# Patient Record
Sex: Female | Born: 1955
Health system: Southern US, Community
[De-identification: ages and names within clinical notes are randomized; demographics above are authoritative.]

## PROBLEM LIST (undated history)

## (undated) DIAGNOSIS — R1310 Dysphagia, unspecified: Secondary | ICD-10-CM

## (undated) DIAGNOSIS — M1990 Unspecified osteoarthritis, unspecified site: Secondary | ICD-10-CM

## (undated) DIAGNOSIS — Z8659 Personal history of other mental and behavioral disorders: Secondary | ICD-10-CM

## (undated) DIAGNOSIS — F419 Anxiety disorder, unspecified: Secondary | ICD-10-CM

## (undated) DIAGNOSIS — E039 Hypothyroidism, unspecified: Secondary | ICD-10-CM

## (undated) DIAGNOSIS — G43909 Migraine, unspecified, not intractable, without status migrainosus: Secondary | ICD-10-CM

## (undated) DIAGNOSIS — K219 Gastro-esophageal reflux disease without esophagitis: Secondary | ICD-10-CM

## (undated) DIAGNOSIS — D509 Iron deficiency anemia, unspecified: Secondary | ICD-10-CM

## (undated) DIAGNOSIS — D72821 Monocytosis (symptomatic): Secondary | ICD-10-CM

## (undated) DIAGNOSIS — K900 Celiac disease: Secondary | ICD-10-CM

## (undated) DIAGNOSIS — K9089 Other intestinal malabsorption: Secondary | ICD-10-CM

## (undated) HISTORY — DX: Personal history of other mental and behavioral disorders: Z86.59

## (undated) HISTORY — PX: TMJ ARTHROPLASTY: SHX1066

## (undated) HISTORY — DX: Migraine, unspecified, not intractable, without status migrainosus: G43.909

## (undated) HISTORY — PX: SINUS IRRIGATION: SHX2411

## (undated) HISTORY — DX: Anxiety disorder, unspecified: F41.9

## (undated) HISTORY — DX: Monocytosis (symptomatic): D72.821

## (undated) HISTORY — DX: Gastro-esophageal reflux disease without esophagitis: K21.9

## (undated) HISTORY — DX: Dysphagia, unspecified: R13.10

## (undated) HISTORY — PX: ABDOMINAL HYSTERECTOMY: SHX81

## (undated) HISTORY — DX: Celiac disease: K90.0

## (undated) HISTORY — DX: Iron deficiency anemia, unspecified: D50.9

## (undated) HISTORY — PX: DIAGNOSTIC LAPAROSCOPY: SUR761

## (undated) HISTORY — DX: Hypothyroidism, unspecified: E03.9

## (undated) HISTORY — DX: Other intestinal malabsorption: K90.89

---

## 1998-07-15 ENCOUNTER — Encounter: Payer: Self-pay | Admitting: Gynecology

## 1998-07-15 ENCOUNTER — Ambulatory Visit (HOSPITAL_COMMUNITY): Admission: RE | Admit: 1998-07-15 | Discharge: 1998-07-15 | Payer: Self-pay | Admitting: Gynecology

## 2000-08-03 ENCOUNTER — Other Ambulatory Visit: Admission: RE | Admit: 2000-08-03 | Discharge: 2000-08-03 | Payer: Self-pay | Admitting: Gynecology

## 2001-05-06 ENCOUNTER — Encounter: Admission: RE | Admit: 2001-05-06 | Discharge: 2001-05-06 | Payer: Self-pay | Admitting: Family Medicine

## 2001-05-06 ENCOUNTER — Encounter: Payer: Self-pay | Admitting: Family Medicine

## 2001-08-08 ENCOUNTER — Other Ambulatory Visit: Admission: RE | Admit: 2001-08-08 | Discharge: 2001-08-08 | Payer: Self-pay | Admitting: Gynecology

## 2002-08-14 ENCOUNTER — Other Ambulatory Visit: Admission: RE | Admit: 2002-08-14 | Discharge: 2002-08-14 | Payer: Self-pay | Admitting: Gynecology

## 2002-08-16 ENCOUNTER — Encounter: Payer: Self-pay | Admitting: Gynecology

## 2002-08-16 ENCOUNTER — Ambulatory Visit (HOSPITAL_COMMUNITY): Admission: RE | Admit: 2002-08-16 | Discharge: 2002-08-16 | Payer: Self-pay | Admitting: Gynecology

## 2003-02-03 HISTORY — PX: FLEXIBLE SIGMOIDOSCOPY: SHX1649

## 2003-05-10 ENCOUNTER — Ambulatory Visit (HOSPITAL_COMMUNITY): Admission: RE | Admit: 2003-05-10 | Discharge: 2003-05-10 | Payer: Self-pay | Admitting: Chiropractic Medicine

## 2003-06-03 HISTORY — PX: COLONOSCOPY: SHX174

## 2003-08-23 ENCOUNTER — Other Ambulatory Visit: Admission: RE | Admit: 2003-08-23 | Discharge: 2003-08-23 | Payer: Self-pay | Admitting: Gynecology

## 2003-11-05 ENCOUNTER — Ambulatory Visit (HOSPITAL_COMMUNITY): Admission: RE | Admit: 2003-11-05 | Discharge: 2003-11-05 | Payer: Self-pay | Admitting: Gastroenterology

## 2003-11-05 ENCOUNTER — Encounter (INDEPENDENT_AMBULATORY_CARE_PROVIDER_SITE_OTHER): Payer: Self-pay | Admitting: *Deleted

## 2004-01-05 ENCOUNTER — Ambulatory Visit (HOSPITAL_COMMUNITY): Admission: RE | Admit: 2004-01-05 | Discharge: 2004-01-05 | Payer: Self-pay | Admitting: Oncology

## 2004-03-06 ENCOUNTER — Ambulatory Visit (HOSPITAL_COMMUNITY): Admission: RE | Admit: 2004-03-06 | Discharge: 2004-03-06 | Payer: Self-pay | Admitting: Orthopedic Surgery

## 2004-03-11 ENCOUNTER — Ambulatory Visit: Payer: Self-pay | Admitting: Oncology

## 2004-05-06 ENCOUNTER — Ambulatory Visit: Payer: Self-pay | Admitting: Oncology

## 2004-07-14 ENCOUNTER — Ambulatory Visit (HOSPITAL_COMMUNITY): Admission: RE | Admit: 2004-07-14 | Discharge: 2004-07-14 | Payer: Self-pay | Admitting: Gynecology

## 2004-08-21 ENCOUNTER — Other Ambulatory Visit: Admission: RE | Admit: 2004-08-21 | Discharge: 2004-08-21 | Payer: Self-pay | Admitting: Gynecology

## 2004-09-05 ENCOUNTER — Ambulatory Visit: Payer: Self-pay | Admitting: Oncology

## 2005-04-28 ENCOUNTER — Ambulatory Visit: Payer: Self-pay | Admitting: Oncology

## 2005-05-04 ENCOUNTER — Ambulatory Visit (HOSPITAL_COMMUNITY): Admission: RE | Admit: 2005-05-04 | Discharge: 2005-05-04 | Payer: Self-pay | Admitting: Obstetrics and Gynecology

## 2005-06-15 ENCOUNTER — Ambulatory Visit: Payer: Self-pay | Admitting: Oncology

## 2005-08-26 ENCOUNTER — Encounter: Admission: RE | Admit: 2005-08-26 | Discharge: 2005-08-26 | Payer: Self-pay | Admitting: Orthopedic Surgery

## 2005-08-27 ENCOUNTER — Other Ambulatory Visit: Admission: RE | Admit: 2005-08-27 | Discharge: 2005-08-27 | Payer: Self-pay | Admitting: Gynecology

## 2005-09-16 ENCOUNTER — Encounter: Admission: RE | Admit: 2005-09-16 | Discharge: 2005-09-16 | Payer: Self-pay | Admitting: Orthopedic Surgery

## 2006-03-22 ENCOUNTER — Ambulatory Visit: Payer: Self-pay | Admitting: Oncology

## 2006-06-10 ENCOUNTER — Ambulatory Visit: Payer: Self-pay | Admitting: Oncology

## 2006-06-14 LAB — CBC & DIFF AND RETIC
BASO%: 1.3 % (ref 0.0–2.0)
Basophils Absolute: 0.1 10*3/uL (ref 0.0–0.1)
EOS%: 6.1 % (ref 0.0–7.0)
Eosinophils Absolute: 0.5 10*3/uL (ref 0.0–0.5)
HCT: 39.5 % (ref 34.8–46.6)
HGB: 13.5 g/dL (ref 11.6–15.9)
IRF: 0.29 (ref 0.130–0.330)
LYMPH%: 25.7 % (ref 14.0–48.0)
MCH: 31.2 pg (ref 26.0–34.0)
MCHC: 34.2 g/dL (ref 32.0–36.0)
MCV: 91.2 fL (ref 81.0–101.0)
MONO#: 0.6 10*3/uL (ref 0.1–0.9)
MONO%: 8.6 % (ref 0.0–13.0)
NEUT#: 4.3 10*3/uL (ref 1.5–6.5)
NEUT%: 58.3 % (ref 39.6–76.8)
Platelets: 287 10*3/uL (ref 145–400)
RBC: 4.33 10*6/uL (ref 3.70–5.32)
RDW: 16.5 % — ABNORMAL HIGH (ref 11.3–14.5)
RETIC #: 45.5 10*3/uL (ref 19.7–115.1)
Retic %: 1.1 % (ref 0.4–2.3)
WBC: 7.4 10*3/uL (ref 3.9–10.0)
lymph#: 1.9 10*3/uL (ref 0.9–3.3)

## 2006-06-14 LAB — MORPHOLOGY: PLT EST: ADEQUATE

## 2006-06-14 LAB — IRON AND TIBC
%SAT: 14 % — ABNORMAL LOW (ref 20–55)
Iron: 37 ug/dL — ABNORMAL LOW (ref 42–145)
TIBC: 264 ug/dL (ref 250–470)
UIBC: 227 ug/dL

## 2006-06-14 LAB — FERRITIN: Ferritin: 34 ng/mL (ref 10–291)

## 2006-06-14 LAB — CHCC SMEAR

## 2006-07-28 ENCOUNTER — Ambulatory Visit (HOSPITAL_COMMUNITY): Admission: RE | Admit: 2006-07-28 | Discharge: 2006-07-28 | Payer: Self-pay | Admitting: Gynecology

## 2006-08-31 ENCOUNTER — Other Ambulatory Visit: Admission: RE | Admit: 2006-08-31 | Discharge: 2006-08-31 | Payer: Self-pay | Admitting: Gynecology

## 2007-06-10 ENCOUNTER — Ambulatory Visit: Payer: Self-pay | Admitting: Oncology

## 2007-11-08 ENCOUNTER — Ambulatory Visit: Payer: Self-pay | Admitting: Nurse Practitioner

## 2007-12-22 ENCOUNTER — Ambulatory Visit (HOSPITAL_COMMUNITY): Admission: RE | Admit: 2007-12-22 | Discharge: 2007-12-22 | Payer: Self-pay | Admitting: Gynecology

## 2008-01-06 ENCOUNTER — Encounter: Admission: RE | Admit: 2008-01-06 | Discharge: 2008-01-06 | Payer: Self-pay | Admitting: Gynecology

## 2008-06-11 ENCOUNTER — Ambulatory Visit: Payer: Self-pay | Admitting: Oncology

## 2008-12-04 ENCOUNTER — Ambulatory Visit: Payer: Self-pay | Admitting: Nurse Practitioner

## 2009-06-10 ENCOUNTER — Ambulatory Visit: Payer: Self-pay | Admitting: Oncology

## 2009-08-09 ENCOUNTER — Ambulatory Visit: Payer: Self-pay | Admitting: Oncology

## 2009-08-13 LAB — CBC WITH DIFFERENTIAL/PLATELET
BASO%: 1.4 % (ref 0.0–2.0)
Basophils Absolute: 0.1 10*3/uL (ref 0.0–0.1)
EOS%: 8.3 % — ABNORMAL HIGH (ref 0.0–7.0)
Eosinophils Absolute: 0.5 10*3/uL (ref 0.0–0.5)
HCT: 36.7 % (ref 34.8–46.6)
HGB: 12.2 g/dL (ref 11.6–15.9)
LYMPH%: 39.1 % (ref 14.0–49.7)
MCH: 31.4 pg (ref 25.1–34.0)
MCHC: 33.2 g/dL (ref 31.5–36.0)
MCV: 94.6 fL (ref 79.5–101.0)
MONO#: 0.6 10*3/uL (ref 0.1–0.9)
MONO%: 10.7 % (ref 0.0–14.0)
NEUT#: 2.3 10*3/uL (ref 1.5–6.5)
NEUT%: 40.5 % (ref 38.4–76.8)
Platelets: 274 10*3/uL (ref 145–400)
RBC: 3.88 10*6/uL (ref 3.70–5.45)
RDW: 14.3 % (ref 11.2–14.5)
WBC: 5.8 10*3/uL (ref 3.9–10.3)
lymph#: 2.3 10*3/uL (ref 0.9–3.3)

## 2009-12-31 ENCOUNTER — Ambulatory Visit: Payer: Self-pay | Admitting: Nurse Practitioner

## 2010-01-01 ENCOUNTER — Encounter (INDEPENDENT_AMBULATORY_CARE_PROVIDER_SITE_OTHER): Payer: Self-pay | Admitting: *Deleted

## 2010-01-01 LAB — CONVERTED CEMR LAB
Bilirubin Urine: NEGATIVE
Hemoglobin, Urine: NEGATIVE
Ketones, ur: NEGATIVE mg/dL
Leukocytes, UA: NEGATIVE
Nitrite: NEGATIVE
Protein, ur: NEGATIVE mg/dL
Specific Gravity, Urine: <1.005 — ABNORMAL LOW (ref 1.005–1.030)
Urine Glucose: NEGATIVE mg/dL
Urobilinogen, UA: 0.2 (ref 0.0–1.0)
pH: 7 (ref 5.0–8.0)

## 2010-02-23 ENCOUNTER — Encounter: Payer: Self-pay | Admitting: Orthopedic Surgery

## 2010-06-17 ENCOUNTER — Other Ambulatory Visit: Payer: Self-pay | Admitting: Oncology

## 2010-06-17 ENCOUNTER — Encounter (HOSPITAL_BASED_OUTPATIENT_CLINIC_OR_DEPARTMENT_OTHER): Payer: 59 | Admitting: Oncology

## 2010-06-17 DIAGNOSIS — D473 Essential (hemorrhagic) thrombocythemia: Secondary | ICD-10-CM

## 2010-06-17 DIAGNOSIS — D508 Other iron deficiency anemias: Secondary | ICD-10-CM

## 2010-06-17 DIAGNOSIS — K900 Celiac disease: Secondary | ICD-10-CM

## 2010-06-17 LAB — CBC WITH DIFFERENTIAL/PLATELET
BASO%: 0.5 % (ref 0.0–2.0)
Basophils Absolute: 0.1 10*3/uL (ref 0.0–0.1)
EOS%: 1.7 % (ref 0.0–7.0)
Eosinophils Absolute: 0.2 10*3/uL (ref 0.0–0.5)
HCT: 39.4 % (ref 34.8–46.6)
HGB: 13.1 g/dL (ref 11.6–15.9)
LYMPH%: 26 % (ref 14.0–49.7)
MCH: 31.3 pg (ref 25.1–34.0)
MCHC: 33.2 g/dL (ref 31.5–36.0)
MCV: 94 fL (ref 79.5–101.0)
MONO#: 1.1 10*3/uL — ABNORMAL HIGH (ref 0.1–0.9)
MONO%: 10.3 % (ref 0.0–14.0)
NEUT#: 6.5 10*3/uL (ref 1.5–6.5)
NEUT%: 61.5 % (ref 38.4–76.8)
Platelets: 258 10*3/uL (ref 145–400)
RBC: 4.19 10*6/uL (ref 3.70–5.45)
RDW: 14 % (ref 11.2–14.5)
WBC: 10.6 10*3/uL — ABNORMAL HIGH (ref 3.9–10.3)
lymph#: 2.8 10*3/uL (ref 0.9–3.3)
nRBC: 0 % (ref 0–0)

## 2010-06-17 LAB — VITAMIN B12: Vitamin B-12: 732 pg/mL (ref 211–911)

## 2010-06-17 LAB — FERRITIN: Ferritin: 14 ng/mL (ref 10–291)

## 2010-06-17 NOTE — Assessment & Plan Note (Signed)
NAME:  Marilyn, Collins NO.:  [id number]   MEDICAL RECORD NO.:  [id number]          PATIENT TYPE:  POB   LOCATION:  [place] at [place]:  Emeterio Reeve, MD       DATE OF BIRTH:  Jul 18, 1955   DATE OF SERVICE:  12/04/2008                                  CLINIC NOTE   The patient comes to office today for followup on her migraine  headaches.  The patient was last seen 1 year ago.  Since that time she  has been doing very well.  She has the exact same headache pattern.  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  She works weekend option.  She  notices that she gets a headache at approximately midnight on Fridays  and Saturdays if she has the headaches at all.  She has not had any  auras with her headaches this year.  She does take anti-inflammatories  from time to time for her arthritis.  She has used Reglan with some  help.  She continues to not want any headache prevention.  She has had  no health issues other than in July she has an episode of racing heart.  She felt that that was due to her TSH level being too low and her  medication being too high.  She was given Inderal to take on an as-  needed basis.  She has actually not had taken other than the original  time.  Her TSH will be redrawn this week.   PHYSICAL EXAM:  Well-developed, well-nourished, 55 year old Caucasian  female, in no acute distress.  Blood pressure is 110/42, pulse is 77.   ASSESSMENT:  Migraine with aura.   PLAN:  The patient is asking for a refill on her Imitrex 100 mg 1 p.o.  p.r.n. migraine, #9 with p.r.n. refills and Reglan 10 mg 1 p.o. q.8 h.  p.r.n. nausea #30 with 2 refills.  The patient will return to clinic in  1 year or sooner as needed.      Marilyn Azure, NP    ______________________________  Emeterio Reeve, MD    LR/MEDQ  D:  12/04/2008  T:  12/05/2008  Job:  [id number]

## 2010-06-17 NOTE — Assessment & Plan Note (Signed)
NAME:  Marilyn Collins, Marilyn Collins NO.:  [id number]   MEDICAL RECORD NO.:  [id number]          PATIENT TYPE:  POB   LOCATION:  [place] at [place]:  [place]:  Darron Doom, MD        DATE OF BIRTH:  [birth year]   DATE OF SERVICE:  11/08/2007                                  CLINIC NOTE   The patient comes to the office today for headache management.  She is a  well-known patient to me, testing her for many years at the [facility].  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  The  patient is currently not on any headache prevention.  She feels that her  headaches have been significantly improved with a gluten-free diet.  She  is using a gluten-free diet as she has a celiac disease.  She currently  uses Imitrex 50 mg and that works very well for her.  She does feel that  it gives her a little bit of nausea, but she is not interested in  switching at this time.  She works weekend option and mostly sees that  she has a headache on Friday and Saturday night when she works her  weekend option.  She does have occasional aura that includes a flashing  light that last several minutes.  Her headaches can be either temporal  or frontal.  She does not have any neck involvement.   ALLERGIES:  MORPHINE causes severe nausea and vomiting.   CURRENT MEDICATIONS:  1. Synthroid 0.150 mcg.  2. B complex.  3. Magnesium.  4. Vitamin D.  5. Vivelle patch 0.50.   MENSTRUAL HISTORY:  The patient's menstrual cycle began at the age of  10.   OBSTETRICAL:  She is G1, P2.  She has 1 adopted child.   GYNECOLOGIC HISTORY:  Her last Pap smear was in July 2009, which was  normal.  She did have a colonoscopy in 2005.   SURGICAL HISTORY:  She had a laparoscopy surgeries in 1985 and 1987.  She had a bilateral salpingo-oophorectomy and total abdominal  hysterectomy in 1995.  She had a TMJ surgery in 1991 and a sinus surgery  in 1993.   FAMILY HISTORY:  She  has a father with heart disease and heart attack,  and a mother with cancer.  Her mother had lymphoma.  The patient has  celiac disease and thyroid problems, as well as migraines.   SOCIAL HISTORY:  The patient is married and has 2 children, one 15-year-  old at home.  She again works as an Therapist, sports at [employer].  She does not  smoke.  She does not drink alcohol.  She drinks approximately 2  caffeinated beverages per day.   REVIEW OF SYSTEMS:  The patient denies bruising, numbness, swelling,  muscle aches, fatigue, weight gain, weight loss, dizziness, nausea,  vomiting, chest pain, and vaginal problems.   PHYSICAL EXAMINATION:  VITAL SIGNS:  Blood pressure is 100/54, pulse is  69, weight is 105, and height is 5 feet 7 inches.  GENERAL:  A well-developed, well-nourished 55 year old Caucasian female  in no acute  distress.  HEENT:  The patient's head is normocephalic and atraumatic.  CARDIAC:  Regular rate and rhythm.  LUNGS:  Clear bilaterally.  NEUROLOGIC:  The patient is alert, oriented, and intact.  She has good  muscle coordination and good sensation.  She is fluid in her thoughts  and ideas and images.  She is appropriate in her emotional status.   ASSESSMENT AND PLAN:  Migraine headache with aura.   PLAN:  The patient prefers not to have any prevention at this time.  She  will continue to take Imitrex 100 mg one-half tablet p.r.n. migraine.  She was given a prescription for #9 with p.r.n. refills.  We have also  asked her to add Reglan to that 10 mg 1 p.o. p.r.n. nausea.  She can use  this when she has an aura as well.  She will return to the office on a  p.r.n. basis.      Hubert Azure, NP    ______________________________  Darron Doom, MD    LR/MEDQ  D:  11/08/2007  T:  11/09/2007  Job:  [id number]
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
## 2010-06-20 NOTE — Op Note (Signed)
NAME:  Marilyn Collins, Marilyn Collins NO.:  [id number]   MEDICAL RECORD NO.:  [id number]          PATIENT TYPE:  AMB   LOCATION:  ENDO                         FACILITY:  [facility]   PHYSICIAN:  Ronald Lobo, M.D.   DATE OF BIRTH:  [birth year]   DATE OF PROCEDURE:  11/05/2003  DATE OF DISCHARGE:                                 OPERATIVE REPORT   PROCEDURE PERFORMED:  Upper endoscopy with biopsies.   ENDOSCOPIST:  Cleotis Nipper, M.D.   INDICATIONS FOR PROCEDURE:  The patient is a 55 year old female with  asymptomatic celiac disease, presenting primarily as iron deficiency anemia.  Duodenal biopsies from four months ago showed persistent villous atrophy but  since then, she has really tried to be more rigorous with her gluten  restriction and her anemia has improved.  This is being done to assess the  status of her small bowel mucosa.   FINDINGS:  Endoscopic findings suggestive of active celiac disease.   DESCRIPTION OF PROCEDURE:  The nature, purpose and risks of the procedure  were familiar to the patient, who provided written consent.  Sedation was  fentanyl  50 mcg and Versed 7 mg IV without arrhythmias or desaturation.  The Olympus small caliber video endoscope was passed under direct vision  entering the esophagus quite easily.  The vocal cords were not well seen.   The esophageal mucosa was normal.  There was no evidence of reflux  esophagitis, Barrett's esophagus, varices, infection, dysplasia or  stricture, significant ring or hiatal hernia.  There was a somewhat  prominent squamocolumnar junction and minimal intermittently present hiatal  hernia.  The stomach contained a small clear residual with a little bit of  bile staining.   No gastritis, erosions, ulcers, polyps or masses were seen in the stomach  and a retroflex view was unremarkable again showing just a very small hiatal  hernia intermittently from a _____  perspective.   The pylorus and duodenal bulb  looked normal.  The second duodenum had some  scalloping of the mucosal folds consistent with celiac disease.  Several  biopsies were obtained prior to removal of the scope.   The patient tolerated the procedure well and there were no apparent  complications.   IMPRESSION:  Celiac disease, with subtle endoscopic findings on current  examination.   PLAN:  Await pathology results.       RB/MEDQ  D:  11/05/2003  T:  11/05/2003  Job:  [id number]   cc:   Herbie Baltimore L. Maceo Pro, M.D.  [address] [place]  [place] [address]  Fax: [phone]   Alyson Locket. Beryle Beams, M.D.  [place]. [place] [address]  Fax: [phone]   Selinda Orion, M.D.  [address]  [place] [address]  Fax: [phone]

## 2010-10-14 ENCOUNTER — Other Ambulatory Visit: Payer: Self-pay | Admitting: Gynecology

## 2010-12-04 ENCOUNTER — Encounter (HOSPITAL_BASED_OUTPATIENT_CLINIC_OR_DEPARTMENT_OTHER): Payer: 59 | Admitting: Oncology

## 2010-12-04 DIAGNOSIS — K900 Celiac disease: Secondary | ICD-10-CM

## 2010-12-04 DIAGNOSIS — D508 Other iron deficiency anemias: Secondary | ICD-10-CM

## 2011-02-12 ENCOUNTER — Other Ambulatory Visit (HOSPITAL_COMMUNITY): Payer: Self-pay | Admitting: Orthopedic Surgery

## 2011-02-12 DIAGNOSIS — M545 Low back pain, unspecified: Secondary | ICD-10-CM

## 2011-02-17 ENCOUNTER — Ambulatory Visit (HOSPITAL_COMMUNITY)
Admission: RE | Admit: 2011-02-17 | Discharge: 2011-02-17 | Disposition: A | Discharge status: Home / Self Care | Payer: 59 | Source: Ambulatory Visit | Attending: Orthopedic Surgery | Admitting: Orthopedic Surgery

## 2011-02-17 DIAGNOSIS — G8929 Other chronic pain: Secondary | ICD-10-CM | POA: Insufficient documentation

## 2011-02-17 DIAGNOSIS — M545 Low back pain, unspecified: Secondary | ICD-10-CM | POA: Insufficient documentation

## 2011-02-17 DIAGNOSIS — M5126 Other intervertebral disc displacement, lumbar region: Secondary | ICD-10-CM | POA: Insufficient documentation

## 2011-05-01 ENCOUNTER — Telehealth: Payer: Self-pay | Admitting: Oncology

## 2011-05-01 ENCOUNTER — Telehealth: Payer: Self-pay | Admitting: *Deleted

## 2011-05-01 NOTE — Telephone Encounter (Signed)
Pt called reporting that she has had systemic fungal infections off & on, obvious nail fungus & has been taking antifungals but keeps coming back.  She wants to know if Dr. Beryle Beams has any suggestions for her.  She reports that she has an appt in June with Dr. Darnell Level & wonders if he  would see her sooner to discuss.  She can be reached at home @ [phone] or cell # [phone].

## 2011-05-01 NOTE — Telephone Encounter (Signed)
Pt called for f/u appt and was given appt for 6/17 [facility]).

## 2011-05-26 ENCOUNTER — Telehealth: Payer: Self-pay

## 2011-05-26 NOTE — Telephone Encounter (Signed)
Pt notified per Dr Beryle Beams "lab ok."  Pt given Ferritin level - 78.  Copy mailed to pt's home per her request. dph

## 2011-06-05 ENCOUNTER — Encounter: Payer: Self-pay | Admitting: Oncology

## 2011-07-20 ENCOUNTER — Encounter: Payer: Self-pay | Admitting: Oncology

## 2011-07-20 ENCOUNTER — Ambulatory Visit: Payer: 59 | Admitting: Oncology

## 2011-07-20 ENCOUNTER — Ambulatory Visit (HOSPITAL_BASED_OUTPATIENT_CLINIC_OR_DEPARTMENT_OTHER): Payer: 59 | Admitting: Oncology

## 2011-07-20 ENCOUNTER — Telehealth: Payer: Self-pay | Admitting: Oncology

## 2011-07-20 VITALS — BP 117/59 | HR 62 | Temp 97.1°F | Ht 67.0 in | Wt 100.9 lb

## 2011-07-20 DIAGNOSIS — D509 Iron deficiency anemia, unspecified: Secondary | ICD-10-CM

## 2011-07-20 DIAGNOSIS — K900 Celiac disease: Secondary | ICD-10-CM

## 2011-07-20 DIAGNOSIS — K9089 Other intestinal malabsorption: Secondary | ICD-10-CM

## 2011-07-20 DIAGNOSIS — D72821 Monocytosis (symptomatic): Secondary | ICD-10-CM | POA: Insufficient documentation

## 2011-07-20 DIAGNOSIS — K909 Intestinal malabsorption, unspecified: Secondary | ICD-10-CM

## 2011-07-20 DIAGNOSIS — M899 Disorder of bone, unspecified: Secondary | ICD-10-CM

## 2011-07-20 DIAGNOSIS — E611 Iron deficiency: Secondary | ICD-10-CM | POA: Insufficient documentation

## 2011-07-20 HISTORY — DX: Monocytosis (symptomatic): D72.821

## 2011-07-20 HISTORY — DX: Iron deficiency anemia, unspecified: D50.9

## 2011-07-20 HISTORY — DX: Other intestinal malabsorption: K90.89

## 2011-07-20 HISTORY — DX: Celiac disease: K90.0

## 2011-07-20 NOTE — Telephone Encounter (Signed)
gve the pt her June 2014 appt calendar

## 2011-07-21 NOTE — Progress Notes (Signed)
Hematology and Oncology Follow Up Visit  Marilyn Collins [id number] 1955-08-22 56 y.o. 07/21/2011 8:54 PM   Principle Diagnosis: Encounter Diagnoses  Name Primary?  . Adult form of celiac disease   . Iron deficiency anemia Yes  . Malabsorption of iron   . 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 Overall she is doing well. No interim medical problems. She continues to have pain in her hand joints primarily the right thumb joint. She did see Dr. Patrecia Pour, rheumatology. Findings felt to be consistent with osteoarthritis. Lab studies were done which showed a negative ANA, negative rheumatoid factor, ESR of 1 mm done 07/02/2011.  Medications: reviewed  Allergies:  Allergies  Allergen Reactions  . Morphine And Related Nausea And Vomiting  . Venofer (Ferric Oxide) Anaphylaxis    Review of Systems: Constitutional:   No constitutional symptoms Respiratory: No cough or dyspnea Cardiovascular:  No chest pain or palpitations Gastrointestinal: No change in bowel habit. She uses a lot of fiber to keep from getting constipated Genito-Urinary: No GU symptoms Musculoskeletal: See above Neurologic: No headache or change in vision Skin: No rash or ecchymosis Remaining ROS negative.  Physical Exam: Blood pressure 117/59, pulse 62, temperature 97.1 F (36.2 C), temperature source Oral, height 5' 7"  (1.702 m), weight 100 lb 14.4 oz (45.768 kg). Wt Readings from Last 3 Encounters:  07/20/11 100 lb 14.4 oz (45.768 kg)     General appearance: Very thin Caucasian woman HENNT: Pharynx no erythema or exudate Lymph nodes: Multiple tiny subcentimeter lymph nodes left axilla Breasts: Not examined Lungs: Clear to auscultation resonant to percussion Heart: Regular rhythm no murmur Abdomen: Soft nontender no mass no organomegaly Extremities: No edema no calf tenderness Vascular:  No cyanosis Neurologic: No focal deficit Skin: No rash or ecchymosis  Lab Results: Lab Results  Component Value Date   WBC 10.6* 06/17/2010   HGB 13.1 06/17/2010   HCT 39.4 06/17/2010   MCV 94.0 06/17/2010   PLT 258 06/17/2010     Chemistry   No results found for this basename: NA, K, CL, CO2, BUN, CREATININE, GLU   No results found for this basename: CALCIUM, ALKPHOS, AST, ALT, BILITOT     Please see labs scanned in from [facility] dated 05/23/11. Hemoglobin was 13.5. Ferritin 76. Total white count 8100. No differential.  Radiological Studies: No results found.  Impression and Plan: #1. Iron malabsorption She gets periodic intravenous iron infusions. We will continue to monitor CBC and iron studies every 3 months.  #2. Adult celiac disease  #3. Early osteoarthritis  #4. Chronic idiopathic monocytosis.  #5. Hypothyroid on replacement  CC:.  Dr. Juanda Crumble lomax. Dr. Herbie Baltimore freed. Dr. Ronald Lobo   Annia Belt, MD 6/18/20138:54 PM

## 2011-07-23 NOTE — Progress Notes (Signed)
Received office notes from Dr. Estanislado Pandy @ [facility]; forwarded to Dr. Beryle Beams.

## 2011-08-25 ENCOUNTER — Telehealth: Payer: Self-pay | Admitting: *Deleted

## 2011-08-25 NOTE — Telephone Encounter (Signed)
Patient works at [employer] and requests that we mail her a script for her lab work.  Script written and mailed to patients home per her request.

## 2011-09-09 ENCOUNTER — Other Ambulatory Visit (HOSPITAL_COMMUNITY): Payer: Self-pay | Admitting: Gynecology

## 2011-09-09 DIAGNOSIS — Z1231 Encounter for screening mammogram for malignant neoplasm of breast: Secondary | ICD-10-CM

## 2011-09-24 ENCOUNTER — Ambulatory Visit (HOSPITAL_COMMUNITY)
Admission: RE | Admit: 2011-09-24 | Discharge: 2011-09-24 | Disposition: A | Discharge status: Home / Self Care | Payer: 59 | Source: Ambulatory Visit | Attending: Gynecology | Admitting: Gynecology

## 2011-09-24 DIAGNOSIS — Z1231 Encounter for screening mammogram for malignant neoplasm of breast: Secondary | ICD-10-CM | POA: Insufficient documentation

## 2011-10-19 ENCOUNTER — Other Ambulatory Visit: Payer: Self-pay | Admitting: Gynecology

## 2012-06-03 ENCOUNTER — Other Ambulatory Visit: Payer: Self-pay | Admitting: Orthopedic Surgery

## 2012-06-03 DIAGNOSIS — M25511 Pain in right shoulder: Secondary | ICD-10-CM

## 2012-06-09 ENCOUNTER — Ambulatory Visit
Admission: RE | Admit: 2012-06-09 | Discharge: 2012-06-09 | Disposition: A | Discharge status: Home / Self Care | Payer: 59 | Source: Ambulatory Visit | Attending: Orthopedic Surgery | Admitting: Orthopedic Surgery

## 2012-06-09 DIAGNOSIS — M25511 Pain in right shoulder: Secondary | ICD-10-CM

## 2012-07-18 ENCOUNTER — Other Ambulatory Visit (HOSPITAL_BASED_OUTPATIENT_CLINIC_OR_DEPARTMENT_OTHER): Payer: 59 | Admitting: Lab

## 2012-07-18 ENCOUNTER — Telehealth: Payer: Self-pay | Admitting: Oncology

## 2012-07-18 ENCOUNTER — Ambulatory Visit (HOSPITAL_BASED_OUTPATIENT_CLINIC_OR_DEPARTMENT_OTHER): Payer: 59 | Admitting: Oncology

## 2012-07-18 VITALS — BP 102/57 | HR 68 | Temp 97.7°F | Resp 18 | Ht 67.0 in | Wt 97.8 lb

## 2012-07-18 DIAGNOSIS — D509 Iron deficiency anemia, unspecified: Secondary | ICD-10-CM

## 2012-07-18 DIAGNOSIS — K900 Celiac disease: Secondary | ICD-10-CM

## 2012-07-18 DIAGNOSIS — K9089 Other intestinal malabsorption: Secondary | ICD-10-CM

## 2012-07-18 DIAGNOSIS — D72821 Monocytosis (symptomatic): Secondary | ICD-10-CM

## 2012-07-18 LAB — IGG, IGA, IGM
IgA: 123 mg/dL (ref 69–380)
IgG (Immunoglobin G), Serum: 956 mg/dL (ref 690–1700)
IgM, Serum: 76 mg/dL (ref 52–322)

## 2012-07-18 LAB — CBC & DIFF AND RETIC
BASO%: 0.5 % (ref 0.0–2.0)
Basophils Absolute: 0 10*3/uL (ref 0.0–0.1)
EOS%: 3.4 % (ref 0.0–7.0)
Eosinophils Absolute: 0.3 10*3/uL (ref 0.0–0.5)
HCT: 40.7 % (ref 34.8–46.6)
HGB: 13.5 g/dL (ref 11.6–15.9)
Immature Retic Fract: 6.5 % (ref 1.60–10.00)
LYMPH%: 29.7 % (ref 14.0–49.7)
MCH: 31.8 pg (ref 25.1–34.0)
MCHC: 33.2 g/dL (ref 31.5–36.0)
MCV: 95.8 fL (ref 79.5–101.0)
MONO#: 1 10*3/uL — ABNORMAL HIGH (ref 0.1–0.9)
MONO%: 12.6 % (ref 0.0–14.0)
NEUT#: 4.2 10*3/uL (ref 1.5–6.5)
NEUT%: 53.8 % (ref 38.4–76.8)
Platelets: 244 10*3/uL (ref 145–400)
RBC: 4.25 10*6/uL (ref 3.70–5.45)
RDW: 13.8 % (ref 11.2–14.5)
Retic %: 1.27 % (ref 0.70–2.10)
Retic Ct Abs: 53.98 10*3/uL (ref 33.70–90.70)
WBC: 7.9 10*3/uL (ref 3.9–10.3)
lymph#: 2.3 10*3/uL (ref 0.9–3.3)

## 2012-07-18 LAB — FERRITIN: Ferritin: 25 ng/mL (ref 10–291)

## 2012-07-18 LAB — MORPHOLOGY
PLT EST: ADEQUATE
RBC Comments: NORMAL

## 2012-07-18 LAB — CHCC SMEAR

## 2012-07-18 LAB — IRON AND TIBC
%SAT: 32 % (ref 20–55)
Iron: 88 ug/dL (ref 42–145)
TIBC: 272 ug/dL (ref 250–470)
UIBC: 184 ug/dL (ref 125–400)

## 2012-07-18 NOTE — Telephone Encounter (Signed)
gv and printeda ppt sched and avs for pt.
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
## 2012-08-01 ENCOUNTER — Telehealth: Payer: Self-pay | Admitting: *Deleted

## 2012-08-01 NOTE — Telephone Encounter (Signed)
Late Entry:  Pt returned call from earlier on fri 07/29/12 & reports that she saw her labs on MyChart & was pleased.

## 2012-08-01 NOTE — Telephone Encounter (Signed)
Message copied by Jesse Fall on Mon Aug 01, 2012  8:43 AM ------      Message from: Marilyn Collins      Created: Tue Jul 19, 2012  4:06 PM       Call pt w results Hb 13.5 ferritin 25 - looking good!  No need for IV iron at present  Cc lab to pt ------

## 2012-10-20 ENCOUNTER — Other Ambulatory Visit: Payer: Self-pay | Admitting: Gastroenterology

## 2012-10-20 ENCOUNTER — Encounter (HOSPITAL_COMMUNITY): Payer: Self-pay | Admitting: *Deleted

## 2012-10-20 ENCOUNTER — Encounter (HOSPITAL_COMMUNITY)
Admission: RE | Disposition: A | Discharge status: Home / Self Care | Payer: Self-pay | Source: Ambulatory Visit | Attending: Gastroenterology

## 2012-10-20 ENCOUNTER — Ambulatory Visit (HOSPITAL_COMMUNITY)
Admission: RE | Admit: 2012-10-20 | Discharge: 2012-10-20 | Disposition: A | Discharge status: Home / Self Care | Payer: 59 | Source: Ambulatory Visit | Attending: Gastroenterology | Admitting: Gastroenterology

## 2012-10-20 DIAGNOSIS — D72820 Lymphocytosis (symptomatic): Secondary | ICD-10-CM | POA: Insufficient documentation

## 2012-10-20 DIAGNOSIS — R1013 Epigastric pain: Secondary | ICD-10-CM | POA: Insufficient documentation

## 2012-10-20 DIAGNOSIS — Z791 Long term (current) use of non-steroidal anti-inflammatories (NSAID): Secondary | ICD-10-CM | POA: Insufficient documentation

## 2012-10-20 DIAGNOSIS — K319 Disease of stomach and duodenum, unspecified: Secondary | ICD-10-CM | POA: Insufficient documentation

## 2012-10-20 DIAGNOSIS — E039 Hypothyroidism, unspecified: Secondary | ICD-10-CM | POA: Insufficient documentation

## 2012-10-20 DIAGNOSIS — K900 Celiac disease: Secondary | ICD-10-CM | POA: Insufficient documentation

## 2012-10-20 HISTORY — DX: Unspecified osteoarthritis, unspecified site: M19.90

## 2012-10-20 HISTORY — DX: Hypothyroidism, unspecified: E03.9

## 2012-10-20 HISTORY — PX: ESOPHAGOGASTRODUODENOSCOPY: SHX5428

## 2012-10-20 SURGERY — EGD (ESOPHAGOGASTRODUODENOSCOPY)
Anesthesia: Moderate Sedation

## 2012-10-20 MED ORDER — FENTANYL CITRATE 0.05 MG/ML IJ SOLN
INTRAMUSCULAR | Status: DC | PRN
  Administered 2012-10-20: 25 ug via INTRAVENOUS

## 2012-10-20 MED ORDER — MIDAZOLAM HCL 10 MG/2ML IJ SOLN
INTRAMUSCULAR | Status: DC | PRN
  Administered 2012-10-20 (×2): 1 mg via INTRAVENOUS
  Administered 2012-10-20 (×3): 2 mg via INTRAVENOUS

## 2012-10-20 MED ORDER — BUTAMBEN-TETRACAINE-BENZOCAINE 2-2-14 % EX AERO
INHALATION_SPRAY | CUTANEOUS | Status: DC | PRN
  Administered 2012-10-20: 2 via TOPICAL

## 2012-10-20 MED ORDER — FENTANYL CITRATE 0.05 MG/ML IJ SOLN
INTRAMUSCULAR | Status: AC
  Filled 2012-10-20: qty 2

## 2012-10-20 MED ORDER — SODIUM CHLORIDE 0.9 % IV SOLN
INTRAVENOUS | Status: DC
  Administered 2012-10-20: 15:00:00 via INTRAVENOUS

## 2012-10-20 MED ORDER — MIDAZOLAM HCL 10 MG/2ML IJ SOLN
INTRAMUSCULAR | Status: AC
  Filled 2012-10-20: qty 2

## 2012-10-20 NOTE — Op Note (Signed)
[facility] [place], [address]   ENDOSCOPY PROCEDURE REPORT  PATIENT: Marilyn Collins, Marilyn Collins  MR#: [id number] BIRTHDATE: Jun 26, 1955 , 57  yrs. old GENDER: Female ENDOSCOPIST:Lakeyta Vandenheuvel, MD REFERRED BY:  Corine Shelter, MD PROCEDURE DATE:  10/20/2012 PROCEDURE:      upper endoscopy with biopsies ASA CLASS: INDICATIONS:   persistent epigastric pain, unresponsive to PPI therapy MEDICATION:    fentanyl 25 mcg, Versed 8 mg IV TOPICAL ANESTHETIC:    Cetacaine spray  DESCRIPTION OF PROCEDURE:   the patient came as an outpatient to the [facility] long endoscopy unit and provided written consent. Time out was performed and the above sedation was administered, without clinical instability.  The Pentax video endoscope was passed under direct vision. The larynx looked grossly normal. The esophagus was entered without significant difficulty and was entirely normal, without evidence of hiatal hernia, stricture, ring, reflux esophagitis, Barrett's esophagus, infection, varices, or neoplasia.  The stomach contained a moderate bilious residual which was suctioned out. There was some patchy antral erythema, but despite the patient being on daily nonsteroidal anti-inflammatory medication (Dolobid), there was no endoscopic evidence of any significant nonsteroidal gastropathy. Specifically, no erosions or ulcerations were noted. No polyps or masses were seen in the stomach, and a retroflexed view of the cardia was normal.  The pylorus, duodenal bulb, and second duodenum looked normal. The patient has a history of celiac disease but the duodenal mucosa looked normal, consistent with her reported adherence to a gluten restricted diet. Nonetheless, I went ahead and obtained duodenal biopsies.  Prior to removal of the scope, I also obtained antral biopsies, looking for evidence of bile reflux gastritis.  The patient tolerated the procedure  well.     COMPLICATIONS: None  ENDOSCOPIC IMPRESSION:  1. Bile reflux. This could certainly account for the patient's epigastric discomfort and the absence of response to PPI therapy 2. No evidence of nonsteroidal gastropathy 3. Normal-appearing duodenal mucosa, on gluten restricted diet for celiac disease  RECOMMENDATIONS:  1. Await pathology results 2. Trial of sucralfate 1 g by mouth 4 times a day for the next week to see if that helps her symptoms, which would further support the impression that she has bile reflux as the source of her symptoms.   _______________________________ Lorrin MaisRonald Lobo, MD 10/20/2012 4:07 PM    PATIENT NAME:  Marilyn Collins, Marilyn Collins MR#: [id number]

## 2012-10-20 NOTE — H&P (Signed)
  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   It is not obviously made worse by eating, but there is a sense that food just "sits" in the epigastric area following meals.   Initially, there was some vomiting, but that has resolved. There has been no coffee ground emesis.  A 10 day trial of Prilosec brought about perhaps mild improvement in her symptoms.   Of note, the patient is on daily nonsteroidal anti-inflammatory drugs (Dolobid) but there is no prior history of ulcer disease. There has been no problem with melenic stools.   The patient, at this time, is under severe emotional stress, from multiple factors.  Past medical history:  Allergies: Morphine (nausea and vomiting), Venofer (anaphylaxis)  Outpatient medications: Synthroid, Pepcid AC when necessary, estradiol patch, Claritin, probiotic, Diflunisal, vitamins, omeprazole (recently switched to Seltzer), Clonopin at bedtime, clonazepam when necessary, Imitrex when necessary  Operations: History of complete hysterectomy, TMJ  Medical illnesses:  history of endometriosis, history of celiac disease maintained on gluten restricted diet, history of iron deficiency anemia, hypothyroidism, remote history of feeding disorder, migraines  Physical exam:  Thin, NAD, good spirits.  Chest clear.   Heart without murmur or arrhythmia.  Abd flat, firm, nontender.  IMPR:  Stable for egd  Plan:  Proceed to upper endoscopy  Cleotis Nipper, M.D. [phone]

## 2012-10-21 ENCOUNTER — Encounter (HOSPITAL_COMMUNITY): Payer: Self-pay | Admitting: Gastroenterology

## 2012-10-24 ENCOUNTER — Ambulatory Visit (HOSPITAL_BASED_OUTPATIENT_CLINIC_OR_DEPARTMENT_OTHER): Payer: 59 | Admitting: Lab

## 2012-10-24 ENCOUNTER — Other Ambulatory Visit: Payer: Self-pay | Admitting: *Deleted

## 2012-10-24 ENCOUNTER — Telehealth: Payer: Self-pay | Admitting: Oncology

## 2012-10-24 DIAGNOSIS — D509 Iron deficiency anemia, unspecified: Secondary | ICD-10-CM

## 2012-10-24 DIAGNOSIS — K9089 Other intestinal malabsorption: Secondary | ICD-10-CM

## 2012-10-24 DIAGNOSIS — K900 Celiac disease: Secondary | ICD-10-CM

## 2012-10-24 LAB — FERRITIN CHCC: Ferritin: 21 ng/ml (ref 9–269)

## 2012-10-24 LAB — CBC WITH DIFFERENTIAL/PLATELET
BASO%: 0.9 % (ref 0.0–2.0)
Basophils Absolute: 0.1 10*3/uL (ref 0.0–0.1)
EOS%: 0.8 % (ref 0.0–7.0)
Eosinophils Absolute: 0.1 10*3/uL (ref 0.0–0.5)
HCT: 39.2 % (ref 34.8–46.6)
HGB: 13 g/dL (ref 11.6–15.9)
LYMPH%: 21.3 % (ref 14.0–49.7)
MCH: 31.9 pg (ref 25.1–34.0)
MCHC: 33.3 g/dL (ref 31.5–36.0)
MCV: 96 fL (ref 79.5–101.0)
MONO#: 0.8 10*3/uL (ref 0.1–0.9)
MONO%: 9.7 % (ref 0.0–14.0)
NEUT#: 5.8 10*3/uL (ref 1.5–6.5)
NEUT%: 67.3 % (ref 38.4–76.8)
Platelets: 234 10*3/uL (ref 145–400)
RBC: 4.08 10*6/uL (ref 3.70–5.45)
RDW: 13.2 % (ref 11.2–14.5)
WBC: 8.6 10*3/uL (ref 3.9–10.3)
lymph#: 1.8 10*3/uL (ref 0.9–3.3)

## 2012-10-24 NOTE — Telephone Encounter (Signed)
Gave pt appt for lab today  per Janifer Adie , RN

## 2012-10-25 ENCOUNTER — Telehealth: Payer: Self-pay | Admitting: *Deleted

## 2012-10-25 NOTE — Telephone Encounter (Signed)
Message copied by Patton Salles on Tue Oct 25, 2012  1:57 PM ------      Message from: Ignacia Felling      Created: Tue Oct 25, 2012  1:04 PM                   ----- Message -----         From: Annia Belt, MD         Sent: 10/24/2012   4:54 PM           To: Ignacia Felling, RN, Jesse Fall, RN, #            Call pt w results ferritin OK ------

## 2012-10-25 NOTE — Telephone Encounter (Signed)
Left message with Dr Azucena Freed note below

## 2012-12-03 DIAGNOSIS — R1310 Dysphagia, unspecified: Secondary | ICD-10-CM

## 2012-12-03 HISTORY — DX: Dysphagia, unspecified: R13.10

## 2012-12-08 ENCOUNTER — Other Ambulatory Visit: Payer: Self-pay

## 2013-02-01 ENCOUNTER — Telehealth: Payer: Self-pay | Admitting: *Deleted

## 2013-02-01 NOTE — Telephone Encounter (Signed)
Patient calls b/c she thinks she needs some IV iron.  Both legs are tingling and this is a sx she has had before when she has needed iron.   She is actually overdue for her 3 mos.lab check.  Offered for her to come here and check labs,  but she will wait and have it done at work on Friday night at [employer].  She has the script that Dr. Beryle Beams gave her for CBC,diff and ferritin.  She will have it done Friday night and then check with [place] on Monday.   Let her know that it might be Wednesday when we could do her ferritin if she needs it.  She is fine with this and appreciated the call back.

## 2013-02-09 ENCOUNTER — Other Ambulatory Visit: Payer: Self-pay | Admitting: Oncology

## 2013-02-09 ENCOUNTER — Telehealth: Payer: Self-pay | Admitting: *Deleted

## 2013-02-09 NOTE — Telephone Encounter (Signed)
Patient called regarding her appt. I have called her back and moved her appt to Monday.

## 2013-02-10 ENCOUNTER — Ambulatory Visit: Payer: 59

## 2013-02-13 ENCOUNTER — Ambulatory Visit (HOSPITAL_BASED_OUTPATIENT_CLINIC_OR_DEPARTMENT_OTHER): Payer: 59

## 2013-02-13 DIAGNOSIS — K900 Celiac disease: Secondary | ICD-10-CM

## 2013-02-13 DIAGNOSIS — K9089 Other intestinal malabsorption: Secondary | ICD-10-CM

## 2013-02-13 DIAGNOSIS — K922 Gastrointestinal hemorrhage, unspecified: Secondary | ICD-10-CM

## 2013-02-13 DIAGNOSIS — D509 Iron deficiency anemia, unspecified: Secondary | ICD-10-CM

## 2013-02-13 MED ORDER — SODIUM CHLORIDE 0.9 % IV SOLN
1020.0000 mg | Freq: Once | INTRAVENOUS | Status: AC
  Administered 2013-02-13: 1020 mg via INTRAVENOUS
  Filled 2013-02-13: qty 34

## 2013-02-13 MED ORDER — SODIUM CHLORIDE 0.9 % IV SOLN
Freq: Once | INTRAVENOUS | Status: AC
  Administered 2013-02-13: 15:00:00 via INTRAVENOUS

## 2013-02-13 MED ORDER — HEPARIN SOD (PORK) LOCK FLUSH 100 UNIT/ML IV SOLN
500.0000 [IU] | Freq: Once | INTRAVENOUS | Status: DC | PRN
  Filled 2013-02-13: qty 5

## 2013-02-13 NOTE — Patient Instructions (Signed)

## 2013-04-01 ENCOUNTER — Encounter: Payer: Self-pay | Admitting: Oncology

## 2013-04-03 ENCOUNTER — Encounter: Payer: Self-pay | Admitting: General Surgery

## 2013-07-17 ENCOUNTER — Other Ambulatory Visit: Payer: 59 | Admitting: Lab

## 2013-07-17 ENCOUNTER — Ambulatory Visit: Payer: 59 | Admitting: Oncology

## 2013-07-31 ENCOUNTER — Ambulatory Visit (INDEPENDENT_AMBULATORY_CARE_PROVIDER_SITE_OTHER): Payer: 59 | Admitting: Oncology

## 2013-07-31 ENCOUNTER — Encounter: Payer: Self-pay | Admitting: Oncology

## 2013-07-31 VITALS — BP 107/65 | HR 59 | Temp 97.0°F | Ht 67.0 in | Wt 98.7 lb

## 2013-07-31 DIAGNOSIS — D509 Iron deficiency anemia, unspecified: Secondary | ICD-10-CM

## 2013-07-31 DIAGNOSIS — K900 Celiac disease: Secondary | ICD-10-CM

## 2013-07-31 DIAGNOSIS — K9089 Other intestinal malabsorption: Secondary | ICD-10-CM

## 2013-07-31 DIAGNOSIS — D72821 Monocytosis (symptomatic): Secondary | ICD-10-CM

## 2013-07-31 LAB — CBC WITH DIFFERENTIAL/PLATELET
Basophils Absolute: 0 10*3/uL (ref 0.0–0.1)
Basophils Relative: 0 % (ref 0–1)
Eosinophils Absolute: 0.1 10*3/uL (ref 0.0–0.7)
Eosinophils Relative: 1 % (ref 0–5)
HCT: 39.7 % (ref 36.0–46.0)
Hemoglobin: 13.4 g/dL (ref 12.0–15.0)
Lymphocytes Relative: 24 % (ref 12–46)
Lymphs Abs: 2.1 10*3/uL (ref 0.7–4.0)
MCH: 31.8 pg (ref 26.0–34.0)
MCHC: 33.8 g/dL (ref 30.0–36.0)
MCV: 94.3 fL (ref 78.0–100.0)
Monocytes Absolute: 0.8 10*3/uL (ref 0.1–1.0)
Monocytes Relative: 9 % (ref 3–12)
Neutro Abs: 5.9 10*3/uL (ref 1.7–7.7)
Neutrophils Relative %: 66 % (ref 43–77)
Platelets: 215 10*3/uL (ref 150–400)
RBC: 4.21 MIL/uL (ref 3.87–5.11)
RDW: 13.1 % (ref 11.5–15.5)
WBC: 8.9 10*3/uL (ref 4.0–10.5)

## 2013-07-31 NOTE — Progress Notes (Signed)
Patient ID: Marilyn Collins, female   DOB: 05/24/1955, 58 y.o.   MRN: [id number] Hematology and Oncology Follow Up Visit  Marilyn Collins [id number] 02/06/1955 57 y.o. 07/31/2013 2:22 PM   Principle Diagnosis: Encounter Diagnoses  Name Primary?  . Celiac disease Yes  . Iron deficiency anemia, unspecified   . 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 She receives periodic infusions of parenteral iron but has not had to have a dose for about 2 years now.  She has chronic unexplained monocytosis.  She has had no interim medical problems. Her bowel disease has flared up with atypical symptoms primarily in the epigastric region. She had an upper endoscopy by Dr. Cristina Gong on 10/20/2012. Findings were nonspecific. Inspection of the duodenum was normal. There was significant bile reflux which may be related to the patient's symptoms. She is currently on a trial of Doxil at with moderate improvement. Random biopsy showed a nonspecific reactive gastropathy. Stains for Helicobacter were negative. Duodenal biopsy showed a lymphocytic infiltrate with intramucosal lymphocytes felt to be nonspecific.  Her mother is now 80 years old. She is having progressive memory decline and had to be placed in assisted living facility. I took care of her many years ago with a Richter's conversion of low-grade to high-grade lymphoma.   Medications: reviewed  Allergies:  Allergies  Allergen Reactions  . Morphine And Related Nausea And Vomiting  . Venofer [Ferric Oxide] Anaphylaxis    Review of Systems: Hematology:  No bleeding or bruising ENT ROS: No sore throat Breast ROS:  Respiratory ROS: No cough or dyspnea Cardiovascular ROS:  No chest pain or palpitations Gastrointestinal ROS:  See above  Genito-Urinary ROS: Not questioned. She is status post hysterectomy  Musculoskeletal ROS: No muscle bone  or joint pain Neurological ROS: No significant flareups and chronic migraines Dermatological ROS: No rash or ecchymosis Remaining ROS negative:   Physical Exam: Blood pressure 107/65, pulse 59, temperature 97 F (36.1 C), temperature source Oral, height 5' 7"  (1.702 m), weight 98 lb 11.2 oz (44.77 kg), SpO2 100.00%. Wt Readings from Last 3 Encounters:  07/31/13 98 lb 11.2 oz (44.77 kg)  10/20/12 95 lb (43.092 kg)  10/20/12 95 lb (43.092 kg)     General appearance: Thin Caucasian woman HENNT: Pharynx no erythema, exudate, mass, or ulcer. No thyromegaly or thyroid nodules Lymph nodes: No cervical, supraclavicular, or axillary lymphadenopathy Breasts Lungs: Clear to auscultation, resonant to percussion throughout Heart: Regular rhythm, no murmur, no gallop, no rub, no click, no edema Abdomen: Soft, nontender, normal bowel sounds, no mass, no organomegaly Extremities: No edema, no calf tenderness Musculoskeletal: no joint deformities GU:  Vascular: Carotid pulses 2+, no bruits, distal pulses: Dorsalis pedis 1+ symmetric Neurologic: Alert, oriented, PERRLA,  cranial nerves grossly normal, motor strength 5 over 5, reflexes 1+ symmetric, upper body coordination normal, gait normal, Skin: No rash or ecchymosis  Lab Results: CBC W/Diff    Component Value Date/Time   WBC 8.6 10/24/2012 1225   RBC 4.08 10/24/2012 1225   HGB 13.0 10/24/2012 1225   HCT 39.2 10/24/2012 1225   PLT 234 10/24/2012 1225   MCV 96.0 10/24/2012 1225   MCH 31.9 10/24/2012 1225   MCHC 33.3 10/24/2012 1225   RDW 13.2 10/24/2012 1225   LYMPHSABS 1.8 10/24/2012 1225   MONOABS 0.8 10/24/2012 1225   EOSABS 0.1 10/24/2012 1225   BASOSABS 0.1 10/24/2012  1225     Chemistry   No results found for this basename: NA, K, CL, CO2, BUN, CREATININE, GLU   No results found for this basename: CALCIUM, ALKPHOS, AST, ALT, BILITOT       Impression:  #1. Adult celiac disease Partially controlled on gluten-free diet  #2. Iron  malabsorption anemia secondary to #1. She requires periodic iron infusions. Most recent given 02/13/2013. She is able to tolerate the therapy in preparation although it does make her feel sick for about a week or 2 after the infusion. We discussed trying just a 510 mg dose instead of 1020 the next time she needs iron to see if she tolerates this better. We can then give repetitive doses spaced apart by a few weeks.  #3. History of anaphylactic reaction to Venofer iron preparation  #4. Migraine headaches currently not active  #5. Idiopathic monocytosis. Monocytes now only borderline increased at 10-12% and stable over time.   CC: Patient Care Team: Corine Shelter, PA-C as PCP - General (Physician Assistant) Candee Furbish, MD as Consulting Physician (Cardiology)   Annia Belt, MD 6/29/20152:22 PM

## 2013-07-31 NOTE — Patient Instructions (Signed)
Lab today Visit with Dr Darnell Level in 1 year - can do labs at [facility] hospital and fax to [place]

## 2013-08-01 LAB — IRON AND TIBC
%SAT: 41 % (ref 20–55)
Iron: 94 ug/dL (ref 42–145)
TIBC: 230 ug/dL — ABNORMAL LOW (ref 250–470)
UIBC: 136 ug/dL (ref 125–400)

## 2013-08-01 LAB — FERRITIN: Ferritin: 84 ng/mL (ref 10–291)

## 2013-08-02 ENCOUNTER — Telehealth: Payer: Self-pay | Admitting: *Deleted

## 2013-08-02 NOTE — Telephone Encounter (Signed)
Message copied by Ebbie Latus on Wed Aug 02, 2013  4:30 PM ------      Message from: Annia Belt      Created: Tue Aug 01, 2013  8:11 AM       Call pt:  Iron levels good right now ------

## 2013-08-02 NOTE — Telephone Encounter (Signed)
Pt called and informed of Ferritin level of 84 which is good per Dr Beryle Beams.

## 2013-11-27 ENCOUNTER — Other Ambulatory Visit (HOSPITAL_COMMUNITY): Payer: Self-pay | Admitting: Gastroenterology

## 2013-11-27 DIAGNOSIS — R1011 Right upper quadrant pain: Secondary | ICD-10-CM

## 2013-12-01 ENCOUNTER — Ambulatory Visit (HOSPITAL_COMMUNITY)
Admission: RE | Admit: 2013-12-01 | Discharge: 2013-12-01 | Disposition: A | Discharge status: Home / Self Care | Payer: 59 | Source: Ambulatory Visit | Attending: Gastroenterology | Admitting: Gastroenterology

## 2013-12-01 DIAGNOSIS — K900 Celiac disease: Secondary | ICD-10-CM | POA: Insufficient documentation

## 2013-12-01 DIAGNOSIS — K838 Other specified diseases of biliary tract: Secondary | ICD-10-CM | POA: Diagnosis not present

## 2013-12-01 DIAGNOSIS — R109 Unspecified abdominal pain: Secondary | ICD-10-CM | POA: Diagnosis present

## 2013-12-01 DIAGNOSIS — R1011 Right upper quadrant pain: Secondary | ICD-10-CM

## 2013-12-14 ENCOUNTER — Other Ambulatory Visit: Payer: Self-pay | Admitting: Gastroenterology

## 2013-12-25 LAB — HM COLONOSCOPY

## 2014-02-21 ENCOUNTER — Encounter: Payer: Self-pay | Admitting: Podiatrist

## 2014-02-21 ENCOUNTER — Ambulatory Visit (INDEPENDENT_AMBULATORY_CARE_PROVIDER_SITE_OTHER): Payer: 59 | Admitting: Podiatrist

## 2014-02-21 VITALS — BP 114/64 | HR 62 | Resp 16

## 2014-02-21 DIAGNOSIS — B351 Tinea unguium: Secondary | ICD-10-CM

## 2014-02-21 MED ORDER — EFINACONAZOLE 10 % EX SOLN: 1.0000 [drp] | Freq: Every day | CUTANEOUS | Status: DC

## 2014-02-21 NOTE — Patient Instructions (Signed)

## 2014-02-21 NOTE — Progress Notes (Signed)
   Subjective:    Patient ID: Marilyn Collins, female    DOB: [birth year], 59 y.o.   MRN: [id number]  HPI Comments: "I have two bad toenails"  Patient c/o thick, discolored 1st and 2nd toenails bilateral for several years. She has seen Dr. Prudence Davidson and he treated with terbinafine and itraconazole-no help.     Review of Systems  Eyes: Positive for pain.  Gastrointestinal: Positive for abdominal distention.  Endocrine: Positive for cold intolerance.  Musculoskeletal: Positive for arthralgias.  Skin: Positive for rash.  Allergic/Immunologic: Positive for food allergies.  Neurological: Positive for numbness.  Psychiatric/Behavioral: The patient is nervous/anxious.   All other systems reviewed and are negative.      Objective:   Physical Exam Patient is awake, alert, and oriented x 3.  In no acute distress.  Vascular status is intact with palpable pedal pulses at 2/4 DP and PT bilateral and capillary refill time within normal limits. Neurological sensation is also intact bilaterally via Semmes Weinstein monofilament at 5/5 sites. Light touch, vibratory sensation, Achilles tendon reflex is intact. Dermatological exam reveals skin color, turger and texture as normal. No open lesions present.  Musculature intact with dorsiflexion, plantarflexion, inversion, eversion.  Discoloration of bilateral hallux nails and second nails is noted. Dystrophy, thickening, subungual debris is also present. Remainder of the toenails are asymptomatic.       Assessment & Plan:  Probable mycotic fungal infection of bilateral hallux nails and second nails bilateral  Plan: We discussed the treatment options and alternatives. I took a sample of the toenail and sent for pathology and identification organism. She will begin using the Jublia and we may need to add an oral medication depending upon the culture results. We'll call with the result of the culture and go from there.

## 2014-03-19 ENCOUNTER — Telehealth: Payer: Self-pay | Admitting: *Deleted

## 2014-03-19 ENCOUNTER — Encounter: Payer: Self-pay | Admitting: Podiatrist

## 2014-03-19 NOTE — Telephone Encounter (Signed)
I called and informed the patient that the culture came back negative for fungus.  She said it could be due to the antifungal you used previously.  "Are you serious?  That has been a long time ago."  She recommends you try Jublia for 3 months, if you don't notice any difference, she said to call.  Maybe then she will consider prescribing something else.  "Okay, thank you."  Do you have a prescription for Jublia?  I been using it about a month now.

## 2014-03-19 NOTE — Telephone Encounter (Signed)
-----   Message from Bronson Ing, DPM sent at 03/14/2014  5:26 PM EST ----- Regarding: toenail results Her toenail culture was negative for fungus-- this could be due to having treated it in the past with an antifungal.  Would recommend the topical jublia for 3 months and if there is no change, have her call back and I will put her on a different oral medication other than the lamisil she has tried in the past.    If she wants to go ahead and try a different oral, let me know and i'll start her on sporanox  Thanks!

## 2014-03-27 ENCOUNTER — Telehealth: Payer: Self-pay | Admitting: *Deleted

## 2014-03-27 NOTE — Telephone Encounter (Addendum)
Pt calling again after receiving negative fungal results.  Left message to call again.  Pt asked why she was using Jublia if the results were not fungus.  I told her Jublia helped with the appearance and trimming or filing of the toenails.

## 2014-04-06 ENCOUNTER — Telehealth: Payer: Self-pay | Admitting: Oncology

## 2014-04-06 NOTE — Telephone Encounter (Signed)
Call to patient to confirm appointment for 04/09/14 at 11:15 lmtcb

## 2014-04-09 ENCOUNTER — Encounter: Payer: Self-pay | Admitting: Oncology

## 2014-04-09 ENCOUNTER — Ambulatory Visit (INDEPENDENT_AMBULATORY_CARE_PROVIDER_SITE_OTHER): Payer: 59 | Admitting: Oncology

## 2014-04-09 VITALS — BP 114/60 | HR 65 | Temp 97.3°F | Ht 67.0 in | Wt 99.2 lb

## 2014-04-09 DIAGNOSIS — R208 Other disturbances of skin sensation: Secondary | ICD-10-CM

## 2014-04-09 DIAGNOSIS — K900 Celiac disease: Secondary | ICD-10-CM

## 2014-04-09 DIAGNOSIS — E039 Hypothyroidism, unspecified: Secondary | ICD-10-CM

## 2014-04-09 DIAGNOSIS — D508 Other iron deficiency anemias: Secondary | ICD-10-CM

## 2014-04-09 DIAGNOSIS — L298 Other pruritus: Secondary | ICD-10-CM

## 2014-04-09 DIAGNOSIS — L538 Other specified erythematous conditions: Secondary | ICD-10-CM

## 2014-04-09 DIAGNOSIS — D72821 Monocytosis (symptomatic): Secondary | ICD-10-CM

## 2014-04-09 DIAGNOSIS — D509 Iron deficiency anemia, unspecified: Secondary | ICD-10-CM

## 2014-04-09 LAB — IRON AND TIBC
%SAT: 44 % (ref 20–55)
Iron: 111 ug/dL (ref 42–145)
TIBC: 250 ug/dL (ref 250–470)
UIBC: 139 ug/dL (ref 125–400)

## 2014-04-09 LAB — CBC WITH DIFFERENTIAL/PLATELET
Basophils Absolute: 0 10*3/uL (ref 0.0–0.1)
Basophils Relative: 0 % (ref 0–1)
Eosinophils Absolute: 0.1 10*3/uL (ref 0.0–0.7)
Eosinophils Relative: 1 % (ref 0–5)
HCT: 43.8 % (ref 36.0–46.0)
Hemoglobin: 14.2 g/dL (ref 12.0–15.0)
Lymphocytes Relative: 17 % (ref 12–46)
Lymphs Abs: 1.6 10*3/uL (ref 0.7–4.0)
MCH: 31.6 pg (ref 26.0–34.0)
MCHC: 32.4 g/dL (ref 30.0–36.0)
MCV: 97.6 fL (ref 78.0–100.0)
MPV: 11.1 fL (ref 8.6–12.4)
Monocytes Absolute: 1 10*3/uL (ref 0.1–1.0)
Monocytes Relative: 10 % (ref 3–12)
Neutro Abs: 7 10*3/uL (ref 1.7–7.7)
Neutrophils Relative %: 72 % (ref 43–77)
Platelets: 245 10*3/uL (ref 150–400)
RBC: 4.49 MIL/uL (ref 3.87–5.11)
RDW: 13 % (ref 11.5–15.5)
WBC: 9.7 10*3/uL (ref 4.0–10.5)

## 2014-04-09 LAB — FERRITIN: Ferritin: 54 ng/mL (ref 10–291)

## 2014-04-09 NOTE — Progress Notes (Signed)
Patient ID: Marilyn Collins, female   DOB: 1955/11/25, 59 y.o.   MRN: [id number] Hematology and Oncology Follow Up Visit  Marilyn Collins [id number] 07-Nov-1955 59 y.o. 04/09/2014 12:04 PM   Principle Diagnosis: Encounter Diagnoses  Name Primary?  . Iron deficiency anemia Yes  . Chronic idiopathic monocytosis   . 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 She receives periodic infusions of parenteral iron but has not had to have a dose for about 1 year now.  She has chronic unexplained monocytosis.    Interim History:   Since her last visit with me in June 2015, she is starting to get arthralgias primarily in her thumb joints bilaterally. She is having episodes of burning and itching of her skin primarily in the axillae and inguinal regions. She was concerned that this might be a yeast infection. She had a biopsy of one of her toenails looking for yeast but no used was found. She has noted increasing fatigue. She has not had her blood checked in a few months. She has had no change in bowel habit. She tends to be constipated. No flareups of her celiac disease. There is occasionally nonbloody mucus on the stools.  Her mother expired in December 2015. She was 57 years old. I treated her for relapsed lymphoma in the past. She ultimately died of old age and in the last year of her life at developed dementia. Her daughter reports that she was active right up to the end.  Medications:  Current outpatient prescriptions:  .  bifidobacterium infantis (ALIGN) capsule, Take 1 capsule by mouth daily., Disp: , Rfl:  .  clonazePAM (KLONOPIN) 0.5 MG tablet, Take 0.25 mg by mouth at bedtime., Disp: , Rfl:  .  dexlansoprazole (DEXILANT) 60 MG capsule, Take 60 mg by mouth daily., Disp: , Rfl:  .  diflunisal (DOLOBID) 500 MG TABS, Take 500 mg by mouth daily., Disp: , Rfl:  .  Efinaconazole 10 % SOLN, Apply 1 drop topically  daily., Disp: 8 mL, Rfl: 4 .  estradiol (VIVELLE-DOT) 0.05 MG/24HR, Place 1 patch onto the skin 2 (two) times a week., Disp: , Rfl:  .  levothyroxine (SYNTHROID, LEVOTHROID) 137 MCG tablet, Take 125 mcg by mouth daily. , Disp: , Rfl:  .  loratadine (CLARITIN) 10 MG tablet, Take 10 mg by mouth daily., Disp: , Rfl:  .  Multiple Vitamin (MULTIVITAMIN) tablet, Take 1 tablet by mouth. Takes 1 tablet  M, W, F., Disp: , Rfl:   Allergies:  Allergies  Allergen Reactions  . Morphine And Related Nausea And Vomiting  . Venofer [Ferric Oxide] Anaphylaxis    Review of Systems: See HPI Remaining ROS negative:   Physical Exam: Blood pressure 114/60, pulse 65, temperature 97.3 F (36.3 C), temperature source Oral, height 5' 7"  (1.702 m), weight 99 lb 3.2 oz (44.997 kg), SpO2 100 %. Wt Readings from Last 3 Encounters:  04/09/14 99 lb 3.2 oz (44.997 kg)  07/31/13 98 lb 11.2 oz (44.77 kg)  10/20/12 95 lb (43.092 kg)     General appearance: thin caucasian woman HENNT: Pharynx no erythema, exudate, mass, or ulcer. No thyromegaly or thyroid nodules Lymph nodes: No cervical, supraclavicular, or axillary lymphadenopathy Breasts:  Lungs: Clear to auscultation, resonant to percussion throughout Heart: Regular rhythm, no murmur, no gallop, no rub, no click, no edema Abdomen: Soft, nontender, normal bowel sounds, no mass, no organomegaly Extremities: No edema, no calf tenderness Musculoskeletal:  no joint deformities GU:  Vascular: Carotid pulses 2+, no bruits,  Neurologic: Alert, oriented, PERRLA, cranial nerves grossly normal, motor strength 5 over 5, reflexes 1+ symmetric, upper body coordination normal, gait normal, Skin: No rash and specifically I did not see a rash in her axillae or inguinal areas. No ecchymosis. + palmar erythema  Lab Results: CBC W/Diff    Component Value Date/Time   WBC 8.9 07/31/2013 1417   WBC 8.6 10/24/2012 1225   RBC 4.21 07/31/2013 1417   RBC 4.08 10/24/2012 1225    HGB 13.4 07/31/2013 1417   HGB 13.0 10/24/2012 1225   HCT 39.7 07/31/2013 1417   HCT 39.2 10/24/2012 1225   PLT 215 07/31/2013 1417   PLT 234 10/24/2012 1225   MCV 94.3 07/31/2013 1417   MCV 96.0 10/24/2012 1225   MCH 31.8 07/31/2013 1417   MCH 31.9 10/24/2012 1225   MCHC 33.8 07/31/2013 1417   MCHC 33.3 10/24/2012 1225   RDW 13.1 07/31/2013 1417   RDW 13.2 10/24/2012 1225   LYMPHSABS 2.1 07/31/2013 1417   LYMPHSABS 1.8 10/24/2012 1225   MONOABS 0.8 07/31/2013 1417   MONOABS 0.8 10/24/2012 1225   EOSABS 0.1 07/31/2013 1417   EOSABS 0.1 10/24/2012 1225   BASOSABS 0.0 07/31/2013 1417   BASOSABS 0.1 10/24/2012 1225     Chemistry   No results found for: NA, K, CL, CO2, BUN, CREATININE, GLU No results found for: CALCIUM, ALKPHOS, AST, ALT, BILITOT   Lab pending   Impression:  #1. Adult celiac disease overall controlled on a gluten-free diet.  #2. Iron malabsorption anemia secondary to #1.  Today's lab is pending. She will likely need a parenteral iron infusion and given her symptoms of recurrent fatigue. I will call her when results are available and make appropriate arrangements if she needs the iron.  #3. Palmar erythema No history of liver disease. No suspicion for a myeloproliferative disorder. Etiology remains unclear.  #4. Intermittent monocytosis with no progression over time.  #5. Hypothyroid on replacement  #6. History of migraine headaches  #7. History of anaphylactic reaction to Venofer iron preparation.  #8. Migratory pruritus and dysesthesias. Etiology unclear. This does not appear to be a yeast infection. She is not having any other worrisome constitutional symptoms. It might be worthwhile getting a chest radiograph if her symptoms persist. This is one possible presentation for underlying Hodgkin's lymphoma which would be uncommon at her age.   CC: Patient Care Team: Marilyn Shelter, PA-C as PCP - General (Physician Assistant) Jerline Pain, MD as Consulting  Physician (Cardiology)   Annia Belt, MD 3/7/201612:04 PM

## 2014-04-09 NOTE — Patient Instructions (Signed)
To lab here today Continue every 3 month CBC & ferritin at Marilyn Collins Collins Return visit with Dr Darnell Level at [facility] in 1 year

## 2014-04-11 ENCOUNTER — Telehealth: Payer: Self-pay | Admitting: *Deleted

## 2014-04-11 NOTE — Telephone Encounter (Signed)
-----   Message from Annia Belt, MD sent at 04/10/2014  4:53 PM EST ----- Call pt:  Hb 14.2 = great.  Iron levels good.

## 2014-04-11 NOTE — Telephone Encounter (Signed)
Called pt - no answer; left message Hgb 14.2 and iron levels are good per Dr Beryle Beams. And to call if she has any questions.

## 2014-11-12 ENCOUNTER — Telehealth: Payer: Self-pay | Admitting: *Deleted

## 2014-11-12 NOTE — Telephone Encounter (Signed)
Review of record suggests that CBC with ferritin was to be drawn every 3 months at [place] wrote a paper prescription to have this done as requested.  We will get it to the patient via fax or mail, whatever is her preference.  Thanks.

## 2014-11-12 NOTE — Telephone Encounter (Signed)
I will mail rx per pt's request; pt called /informed.

## 2014-11-12 NOTE — Telephone Encounter (Signed)
Returned pt's call - stated she needs another rx for labs to be drawn at [employer] where she works; stated she lost the one given to her in March when she saw Dr Beryle Beams. She request rx to be mailed to her instead of faxed. She has hx of iron infusion and states she's overdue for bloodwork. Pt awared Dr Beryle Beams is on vacation. Thanks

## 2014-11-26 ENCOUNTER — Other Ambulatory Visit: Payer: Self-pay

## 2014-11-26 DIAGNOSIS — Z1231 Encounter for screening mammogram for malignant neoplasm of breast: Secondary | ICD-10-CM

## 2014-12-31 ENCOUNTER — Ambulatory Visit
Admission: RE | Admit: 2014-12-31 | Discharge: 2014-12-31 | Disposition: A | Discharge status: Home / Self Care | Payer: 59 | Source: Ambulatory Visit

## 2014-12-31 DIAGNOSIS — Z1231 Encounter for screening mammogram for malignant neoplasm of breast: Secondary | ICD-10-CM

## 2015-01-25 ENCOUNTER — Other Ambulatory Visit (HOSPITAL_COMMUNITY): Payer: Self-pay | Admitting: Gastroenterology

## 2015-01-25 DIAGNOSIS — R1011 Right upper quadrant pain: Secondary | ICD-10-CM

## 2015-02-11 ENCOUNTER — Ambulatory Visit (HOSPITAL_COMMUNITY): Payer: 59

## 2015-02-21 ENCOUNTER — Ambulatory Visit (HOSPITAL_COMMUNITY)
Admission: RE | Admit: 2015-02-21 | Discharge: 2015-02-21 | Disposition: A | Discharge status: Home / Self Care | Payer: 59 | Source: Ambulatory Visit | Attending: Gastroenterology | Admitting: Gastroenterology

## 2015-02-21 DIAGNOSIS — R1011 Right upper quadrant pain: Secondary | ICD-10-CM | POA: Insufficient documentation

## 2015-02-21 DIAGNOSIS — R112 Nausea with vomiting, unspecified: Secondary | ICD-10-CM | POA: Diagnosis not present

## 2015-02-21 MED ORDER — TECHNETIUM TC 99M MEBROFENIN IV KIT
5.2000 | PACK | Freq: Once | INTRAVENOUS | Status: AC | PRN
  Administered 2015-02-21: 5 via INTRAVENOUS

## 2015-02-21 MED ORDER — SINCALIDE 5 MCG IJ SOLR
0.0200 ug/kg | Freq: Once | INTRAMUSCULAR | Status: AC
  Administered 2015-02-21: 0.9 ug via INTRAVENOUS

## 2015-02-21 MED ORDER — SINCALIDE 5 MCG IJ SOLR
INTRAMUSCULAR | Status: AC
Start: 2015-02-21 — End: 2015-02-21
  Administered 2015-02-21: 0.9 ug via INTRAVENOUS
  Filled 2015-02-21: qty 5

## 2015-02-21 MED FILL — MINIVELLE 0.1 MG PATCH: 0.1 | 28 days supply | Qty: 8 | Fill #0

## 2015-03-11 DIAGNOSIS — R1011 Right upper quadrant pain: Secondary | ICD-10-CM | POA: Diagnosis not present

## 2015-03-11 MED FILL — clonazePAM 1 MG TABS: 1 | 8 days supply | Qty: 15 | Fill #0

## 2015-03-11 MED FILL — LEVOTHYROXINE 125 MCG TAB: 125 | 90 days supply | Qty: 90 | Fill #0

## 2015-03-27 MED FILL — DEXILANT DR 60 MG CAPSULE: 60 | 90 days supply | Qty: 90 | Fill #1

## 2015-04-01 DIAGNOSIS — F4323 Adjustment disorder with mixed anxiety and depressed mood: Secondary | ICD-10-CM | POA: Diagnosis not present

## 2015-04-22 MED FILL — MINIVELLE 0.1 MG PATCH: 0.1 | 28 days supply | Qty: 8 | Fill #1

## 2015-04-29 ENCOUNTER — Ambulatory Visit: Payer: 59 | Admitting: Oncology

## 2015-05-16 ENCOUNTER — Other Ambulatory Visit: Payer: Self-pay | Admitting: Surgery

## 2015-05-16 DIAGNOSIS — K8050 Calculus of bile duct without cholangitis or cholecystitis without obstruction: Secondary | ICD-10-CM | POA: Diagnosis not present

## 2015-05-20 ENCOUNTER — Ambulatory Visit (INDEPENDENT_AMBULATORY_CARE_PROVIDER_SITE_OTHER): Payer: 59 | Admitting: Oncology

## 2015-05-20 ENCOUNTER — Encounter: Payer: Self-pay | Admitting: Oncology

## 2015-05-20 VITALS — BP 106/68 | HR 56 | Temp 97.7°F | Ht 67.0 in | Wt 95.1 lb

## 2015-05-20 DIAGNOSIS — D509 Iron deficiency anemia, unspecified: Secondary | ICD-10-CM | POA: Diagnosis not present

## 2015-05-20 DIAGNOSIS — K900 Celiac disease: Secondary | ICD-10-CM | POA: Diagnosis not present

## 2015-05-20 DIAGNOSIS — D508 Other iron deficiency anemias: Secondary | ICD-10-CM

## 2015-05-20 DIAGNOSIS — D72821 Monocytosis (symptomatic): Secondary | ICD-10-CM

## 2015-05-20 NOTE — Progress Notes (Signed)
Patient ID: Marilyn Collins, female   DOB: [birth year], 60 y.o.   MRN: [id number] Hematology and Oncology Follow Up Visit  Marilyn Collins [id number] [birth year] 60 y.o. 05/20/2015 6:51 PM   Principle Diagnosis: Encounter Diagnoses  Name Primary?  . Iron deficiency anemia Yes  . Chronic idiopathic monocytosis   . Other iron deficiency anemias   . 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 She receives periodic infusions of parenteral iron but has not had to have a dose since January 2015. She has chronic unexplained monocytosis.   Interim History:   Overall doing well. Over the last few months she has developed intermittent atypical right upper quadrant pain with a normal ultrasound of the gallbladder and nuclear medicine hepatobiliary study. She gets occasional vomiting when she gets an episode. Episodes occur every few weeks but are getting more frequent. She has had no improvement on a proton pump inhibitor. She has had an interview with a general surgeon, Dr. Rush Farmer, and he is considering doing a cholecystectomy. She has chronic constipation. She takes daily laxatives and stool softeners. She has had no change in her bowel habit. She said a previous hysterectomy but no other intra-abdominal surgery. 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 She has had a lot of stress with problems that one of her son's is having.  Medications: reviewed  Allergies:  Allergies  Allergen Reactions  . Morphine And Related Nausea And Vomiting  . Venofer [Ferric Oxide] Anaphylaxis    Review of Systems: See interim history Remaining ROS negative:   Physical Exam: Blood pressure 106/68, pulse 56, temperature 97.7 F (36.5 C), temperature source Oral, height 5' 7"  (1.702 m), weight 95 lb 1.6 oz (43.137 kg), SpO2 100 %. Wt Readings from Last 3 Encounters:  05/20/15 95 lb 1.6 oz (43.137 kg)  02/21/15 99 lb  (44.906 kg)  04/09/14 99 lb 3.2 oz (44.997 kg)     General appearance: Thin Caucasian woman HENNT: Pharynx no erythema, exudate, mass, or ulcer. No thyromegaly or thyroid nodules Lymph nodes: No cervical, supraclavicular, or axillary lymphadenopathy Breasts: Lungs: Clear to auscultation, resonant to percussion throughout Heart: Regular rhythm, no murmur, no gallop, no rub, no click, no edema Abdomen: Soft, nontender, normal bowel sounds, no mass, no organomegaly Extremities: No edema, no calf tenderness Musculoskeletal: no joint deformities GU:  Vascular: Carotid pulses 2+, no bruits,  Neurologic: Alert, oriented, PERRLA,  , cranial nerves grossly normal, motor strength 5 over 5, reflexes 1+ symmetric, upper body coordination normal, gait normal, Skin: No rash or ecchymosis. New tattoo right forearm.  Lab Results: CBC W/Diff    Component Value Date/Time   WBC 9.7 04/09/2014 1157   WBC 8.6 10/24/2012 1225   RBC 4.49 04/09/2014 1157   RBC 4.08 10/24/2012 1225   HGB 14.2 04/09/2014 1157   HGB 13.0 10/24/2012 1225   HCT 43.8 04/09/2014 1157   HCT 39.2 10/24/2012 1225   PLT 245 04/09/2014 1157   PLT 234 10/24/2012 1225   MCV 97.6 04/09/2014 1157   MCV 96.0 10/24/2012 1225   MCH 31.6 04/09/2014 1157   MCH 31.9 10/24/2012 1225   MCHC 32.4 04/09/2014 1157   MCHC 33.3 10/24/2012 1225   RDW 13.0 04/09/2014 1157   RDW 13.2 10/24/2012 1225   LYMPHSABS 1.6 04/09/2014 1157   LYMPHSABS 1.8 10/24/2012 1225   MONOABS 1.0 04/09/2014 1157   MONOABS 0.8 10/24/2012 1225   EOSABS 0.1  04/09/2014 1157   EOSABS 0.1 10/24/2012 1225   BASOSABS 0.0 04/09/2014 1157   BASOSABS 0.1 10/24/2012 1225     Chemistry   No results found for: NA, K, CL, CO2, BUN, CREATININE, GLU No results found for: CALCIUM, ALKPHOS, AST, ALT, BILITOT   Lab pending  Radiological Studies: See discussion above   Impression:  #1. Adult celiac disease #2. Iron malabsorption secondary to #1 #3. Anemia secondary to  #1 and 2. She must be observing some iron from her diet. She has not had a dose of IV iron in 2 years. Today's labs are pending but most recent hemoglobin on record from March 2016 was 14.2 with MCV 97.6. I will continue to monitor CBCs and ferritins on an every four-month basis. When necessary iron infusions. #4. Idiopathic monocytosis. No significant change over time.  CC: Patient Care Team: Corine Shelter, PA-C as PCP - General (Physician Assistant) Jerline Pain, MD as Consulting Physician (Cardiology)   Annia Belt, MD 4/17/20176:51 PM

## 2015-05-20 NOTE — Patient Instructions (Signed)
To lab today Continue every 4 month CBC, ferritin at [facility]  Return visit 1 year

## 2015-05-21 ENCOUNTER — Telehealth: Payer: Self-pay | Admitting: *Deleted

## 2015-05-21 LAB — CBC WITH DIFFERENTIAL/PLATELET
Basophils Absolute: 0 10*3/uL (ref 0.0–0.2)
Basos: 0 %
EOS (ABSOLUTE): 0.1 10*3/uL (ref 0.0–0.4)
Eos: 1 %
Hematocrit: 44 % (ref 34.0–46.6)
Hemoglobin: 14.5 g/dL (ref 11.1–15.9)
Immature Grans (Abs): 0 10*3/uL (ref 0.0–0.1)
Immature Granulocytes: 0 %
Lymphocytes Absolute: 2 10*3/uL (ref 0.7–3.1)
Lymphs: 27 %
MCH: 32 pg (ref 26.6–33.0)
MCHC: 33 g/dL (ref 31.5–35.7)
MCV: 97 fL (ref 79–97)
Monocytes Absolute: 0.7 10*3/uL (ref 0.1–0.9)
Monocytes: 9 %
Neutrophils Absolute: 4.7 10*3/uL (ref 1.4–7.0)
Neutrophils: 63 %
Platelets: 240 10*3/uL (ref 150–379)
RBC: 4.53 x10E6/uL (ref 3.77–5.28)
RDW: 13.2 % (ref 12.3–15.4)
WBC: 7.5 10*3/uL (ref 3.4–10.8)

## 2015-05-21 LAB — FERRITIN: Ferritin: 64 ng/mL (ref 15–150)

## 2015-05-21 NOTE — Telephone Encounter (Signed)
Pt called - no answer; left message " Hgb 14.5, Ferritin 64 both normal" per Dr Beryle Beams. And to call if she has any questions.

## 2015-05-21 NOTE — Telephone Encounter (Signed)
-----   Message from Annia Belt, MD sent at 05/21/2015  8:58 AM EDT ----- Call pt: Hb 14.5, ferritin 64: both normal

## 2015-05-29 MED FILL — clonazePAM 1 MG TABS: 1 | 8 days supply | Qty: 15 | Fill #1

## 2015-06-04 MED FILL — ESTRADIOL 0.1 MG PATCH: 0.1 | 84 days supply | Qty: 24 | Fill #0

## 2015-06-04 MED FILL — LEVOTHYROXINE 125 MCG TAB: 125 | 90 days supply | Qty: 90 | Fill #1

## 2015-07-02 MED FILL — DEXILANT DR 60 MG CAPSULE: 60 | 90 days supply | Qty: 90 | Fill #2

## 2015-07-15 DIAGNOSIS — H52223 Regular astigmatism, bilateral: Secondary | ICD-10-CM | POA: Diagnosis not present

## 2015-07-15 DIAGNOSIS — H5213 Myopia, bilateral: Secondary | ICD-10-CM | POA: Diagnosis not present

## 2015-07-15 DIAGNOSIS — H524 Presbyopia: Secondary | ICD-10-CM | POA: Diagnosis not present

## 2015-08-12 MED FILL — clonazePAM 1 MG TABS: 1 | 8 days supply | Qty: 15 | Fill #0

## 2015-08-19 MED FILL — LEVOTHYROXINE 125 MCG TAB: 125 | 90 days supply | Qty: 90 | Fill #2

## 2015-08-29 DIAGNOSIS — M9902 Segmental and somatic dysfunction of thoracic region: Secondary | ICD-10-CM | POA: Diagnosis not present

## 2015-09-04 DIAGNOSIS — M9902 Segmental and somatic dysfunction of thoracic region: Secondary | ICD-10-CM | POA: Diagnosis not present

## 2015-09-11 DIAGNOSIS — M9902 Segmental and somatic dysfunction of thoracic region: Secondary | ICD-10-CM | POA: Diagnosis not present

## 2015-09-19 DIAGNOSIS — M9902 Segmental and somatic dysfunction of thoracic region: Secondary | ICD-10-CM | POA: Diagnosis not present

## 2015-09-25 DIAGNOSIS — M9902 Segmental and somatic dysfunction of thoracic region: Secondary | ICD-10-CM | POA: Diagnosis not present

## 2015-09-26 MED FILL — DEXILANT DR 60 MG CAPSULE: 60 | 90 days supply | Qty: 90 | Fill #3

## 2015-10-03 DIAGNOSIS — M9902 Segmental and somatic dysfunction of thoracic region: Secondary | ICD-10-CM | POA: Diagnosis not present

## 2015-10-16 DIAGNOSIS — M9902 Segmental and somatic dysfunction of thoracic region: Secondary | ICD-10-CM | POA: Diagnosis not present

## 2015-10-23 DIAGNOSIS — M9902 Segmental and somatic dysfunction of thoracic region: Secondary | ICD-10-CM | POA: Diagnosis not present

## 2015-10-27 ENCOUNTER — Other Ambulatory Visit (HOSPITAL_COMMUNITY)
Admission: RE | Admit: 2015-10-27 | Discharge: 2015-10-27 | Disposition: A | Discharge status: Home / Self Care | Payer: 59 | Source: Ambulatory Visit | Attending: Oncology | Admitting: Oncology

## 2015-10-27 DIAGNOSIS — Z049 Encounter for examination and observation for unspecified reason: Secondary | ICD-10-CM | POA: Diagnosis not present

## 2015-10-27 LAB — FERRITIN: Ferritin: 30 ng/mL (ref 11–307)

## 2015-10-27 LAB — CBC WITH DIFFERENTIAL/PLATELET
Basophils Absolute: 0.1 10*3/uL (ref 0.0–0.1)
Basophils Relative: 1 %
Eosinophils Absolute: 0.2 10*3/uL (ref 0.0–0.7)
Eosinophils Relative: 3 %
HCT: 37.2 % (ref 36.0–46.0)
Hemoglobin: 12.8 g/dL (ref 12.0–15.0)
Lymphocytes Relative: 42 %
Lymphs Abs: 2.7 10*3/uL (ref 0.7–4.0)
MCH: 32.7 pg (ref 26.0–34.0)
MCHC: 34.4 g/dL (ref 30.0–36.0)
MCV: 95.1 fL (ref 78.0–100.0)
Monocytes Absolute: 0.6 10*3/uL (ref 0.1–1.0)
Monocytes Relative: 9 %
Neutro Abs: 3 10*3/uL (ref 1.7–7.7)
Neutrophils Relative %: 45 %
Platelets: 220 10*3/uL (ref 150–400)
RBC: 3.91 MIL/uL (ref 3.87–5.11)
RDW: 13.9 % (ref 11.5–15.5)
WBC: 6.5 10*3/uL (ref 4.0–10.5)

## 2015-10-30 DIAGNOSIS — M9902 Segmental and somatic dysfunction of thoracic region: Secondary | ICD-10-CM | POA: Diagnosis not present

## 2015-10-31 ENCOUNTER — Telehealth: Payer: Self-pay | Admitting: *Deleted

## 2015-10-31 NOTE — Telephone Encounter (Signed)
Pt called / informed "Hb 12.8, ferritin 30; we can keep watching. Repeat in 3-4 months" per Dr Beryle Beams. 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

## 2015-10-31 NOTE — Telephone Encounter (Signed)
-----   Message from Annia Belt, MD sent at 10/28/2015  7:17 PM EDT ----- Call pt: Hb 12.8, ferritin 30; we can keep watching. Repeat in 3-4 months

## 2015-11-07 MED FILL — clonazePAM 1 MG TABS: 1 | 8 days supply | Qty: 15 | Fill #1

## 2015-11-20 DIAGNOSIS — M9902 Segmental and somatic dysfunction of thoracic region: Secondary | ICD-10-CM | POA: Diagnosis not present

## 2015-11-21 DIAGNOSIS — D2372 Other benign neoplasm of skin of left lower limb, including hip: Secondary | ICD-10-CM | POA: Diagnosis not present

## 2015-11-21 DIAGNOSIS — Z85828 Personal history of other malignant neoplasm of skin: Secondary | ICD-10-CM | POA: Diagnosis not present

## 2015-11-21 DIAGNOSIS — L819 Disorder of pigmentation, unspecified: Secondary | ICD-10-CM | POA: Diagnosis not present

## 2015-11-21 DIAGNOSIS — L821 Other seborrheic keratosis: Secondary | ICD-10-CM | POA: Diagnosis not present

## 2015-11-26 MED FILL — ESTRADIOL 0.1 MG PATCH: 0.1 | 84 days supply | Qty: 24 | Fill #1

## 2015-11-26 MED FILL — LEVOTHYROXINE 125 MCG TAB: 125 | 90 days supply | Qty: 90 | Fill #3

## 2015-12-11 DIAGNOSIS — N6341 Unspecified lump in right breast, subareolar: Secondary | ICD-10-CM | POA: Diagnosis not present

## 2015-12-11 DIAGNOSIS — Z87898 Personal history of other specified conditions: Secondary | ICD-10-CM | POA: Diagnosis not present

## 2015-12-11 DIAGNOSIS — M9902 Segmental and somatic dysfunction of thoracic region: Secondary | ICD-10-CM | POA: Diagnosis not present

## 2015-12-11 DIAGNOSIS — N6310 Unspecified lump in the right breast, unspecified quadrant: Secondary | ICD-10-CM | POA: Diagnosis not present

## 2015-12-13 ENCOUNTER — Other Ambulatory Visit: Payer: Self-pay | Admitting: Family Medicine

## 2015-12-13 DIAGNOSIS — N6310 Unspecified lump in the right breast, unspecified quadrant: Secondary | ICD-10-CM

## 2015-12-19 DIAGNOSIS — M189 Osteoarthritis of first carpometacarpal joint, unspecified: Secondary | ICD-10-CM | POA: Diagnosis not present

## 2015-12-19 DIAGNOSIS — Z0000 Encounter for general adult medical examination without abnormal findings: Secondary | ICD-10-CM | POA: Diagnosis not present

## 2015-12-19 DIAGNOSIS — Z13220 Encounter for screening for lipoid disorders: Secondary | ICD-10-CM | POA: Diagnosis not present

## 2015-12-19 DIAGNOSIS — F419 Anxiety disorder, unspecified: Secondary | ICD-10-CM | POA: Diagnosis not present

## 2015-12-19 DIAGNOSIS — Z79899 Other long term (current) drug therapy: Secondary | ICD-10-CM | POA: Diagnosis not present

## 2015-12-19 DIAGNOSIS — Z79890 Hormone replacement therapy (postmenopausal): Secondary | ICD-10-CM | POA: Diagnosis not present

## 2015-12-19 DIAGNOSIS — K219 Gastro-esophageal reflux disease without esophagitis: Secondary | ICD-10-CM | POA: Diagnosis not present

## 2015-12-19 DIAGNOSIS — E039 Hypothyroidism, unspecified: Secondary | ICD-10-CM | POA: Diagnosis not present

## 2015-12-19 DIAGNOSIS — Z131 Encounter for screening for diabetes mellitus: Secondary | ICD-10-CM | POA: Diagnosis not present

## 2015-12-20 MED FILL — DEXILANT DR 60 MG CAPSULE: 60 | 90 days supply | Qty: 90 | Fill #0

## 2015-12-23 MED FILL — LEVOTHYROXINE 112 MCG TAB: 112 | 90 days supply | Qty: 90 | Fill #0

## 2016-01-02 ENCOUNTER — Ambulatory Visit
Admission: RE | Admit: 2016-01-02 | Discharge: 2016-01-02 | Disposition: A | Discharge status: Home / Self Care | Payer: 59 | Source: Ambulatory Visit | Attending: Family Medicine | Admitting: Family Medicine

## 2016-01-02 DIAGNOSIS — N6310 Unspecified lump in the right breast, unspecified quadrant: Secondary | ICD-10-CM

## 2016-01-02 DIAGNOSIS — R922 Inconclusive mammogram: Secondary | ICD-10-CM | POA: Diagnosis not present

## 2016-01-02 DIAGNOSIS — N6489 Other specified disorders of breast: Secondary | ICD-10-CM | POA: Diagnosis not present

## 2016-01-02 LAB — HM MAMMOGRAPHY

## 2016-01-02 MED FILL — clonazePAM 1 MG TABS: 1 | 8 days supply | Qty: 15 | Fill #2

## 2016-01-08 DIAGNOSIS — M9902 Segmental and somatic dysfunction of thoracic region: Secondary | ICD-10-CM | POA: Diagnosis not present

## 2016-01-17 MED FILL — SUMATRIPTAN SUCC 50 MG TAB: 50 | 90 days supply | Qty: 27 | Fill #0

## 2016-02-05 DIAGNOSIS — M9902 Segmental and somatic dysfunction of thoracic region: Secondary | ICD-10-CM | POA: Diagnosis not present

## 2016-03-04 DIAGNOSIS — M9902 Segmental and somatic dysfunction of thoracic region: Secondary | ICD-10-CM | POA: Diagnosis not present

## 2016-03-15 ENCOUNTER — Other Ambulatory Visit (HOSPITAL_COMMUNITY)
Admission: RE | Admit: 2016-03-15 | Discharge: 2016-03-15 | Disposition: A | Discharge status: Home / Self Care | Payer: 59 | Source: Ambulatory Visit | Attending: Oncology | Admitting: Oncology

## 2016-03-15 DIAGNOSIS — D509 Iron deficiency anemia, unspecified: Secondary | ICD-10-CM | POA: Diagnosis not present

## 2016-03-15 LAB — CBC WITH DIFFERENTIAL/PLATELET
Basophils Absolute: 0 10*3/uL (ref 0.0–0.1)
Basophils Relative: 1 %
Eosinophils Absolute: 0.3 10*3/uL (ref 0.0–0.7)
Eosinophils Relative: 4 %
HCT: 35.6 % — ABNORMAL LOW (ref 36.0–46.0)
Hemoglobin: 12.3 g/dL (ref 12.0–15.0)
Lymphocytes Relative: 41 %
Lymphs Abs: 2.5 10*3/uL (ref 0.7–4.0)
MCH: 32.1 pg (ref 26.0–34.0)
MCHC: 34.6 g/dL (ref 30.0–36.0)
MCV: 93 fL (ref 78.0–100.0)
Monocytes Absolute: 0.4 10*3/uL (ref 0.1–1.0)
Monocytes Relative: 7 %
Neutro Abs: 2.9 10*3/uL (ref 1.7–7.7)
Neutrophils Relative %: 47 %
Platelets: 217 10*3/uL (ref 150–400)
RBC: 3.83 MIL/uL — ABNORMAL LOW (ref 3.87–5.11)
RDW: 14.4 % (ref 11.5–15.5)
WBC: 6.2 10*3/uL (ref 4.0–10.5)

## 2016-03-15 LAB — FERRITIN: Ferritin: 22 ng/mL (ref 11–307)

## 2016-03-17 ENCOUNTER — Telehealth: Payer: Self-pay | Admitting: *Deleted

## 2016-03-17 ENCOUNTER — Other Ambulatory Visit: Payer: Self-pay | Admitting: Oncology

## 2016-03-17 DIAGNOSIS — K909 Intestinal malabsorption, unspecified: Secondary | ICD-10-CM

## 2016-03-17 MED ORDER — SODIUM CHLORIDE 0.9 % IV SOLN: 510.0000 mg | INTRAVENOUS | Status: AC

## 2016-03-17 NOTE — Telephone Encounter (Signed)
Pt states she will call Laverne at the [facility] and schedule her appt. States she was feeling like she need an iron infusion.

## 2016-03-17 NOTE — Telephone Encounter (Signed)
Pt called - no answer; left message "ferritin drifting down to 22 but hemoglobin holding at 12.3" and need to schedule elective iron infusion at her convenience per Dr Beryle Beams. And to give me a call back to schedule the appt.

## 2016-03-17 NOTE — Telephone Encounter (Signed)
-----   Message from Annia Belt, MD sent at 03/16/2016  6:53 PM EST ----- Call pt: ferritin drifting down to 22 but hemoglobin holding at 12.3 . We can schedule elective iron infusion weekly x 2 at her convenience. Let me know & I will put in orders.

## 2016-03-17 NOTE — Telephone Encounter (Signed)
Feraheme appt scheduled for next Tuesday the 20th @ 1300PM per [facility].

## 2016-03-19 MED FILL — clonazePAM 1 MG TABS: 1 | 30 days supply | Qty: 15 | Fill #0

## 2016-03-20 MED FILL — PANTOPRAZOLE SOD DR 40 MG T: 40 | 90 days supply | Qty: 90 | Fill #0

## 2016-03-23 ENCOUNTER — Other Ambulatory Visit: Payer: Self-pay | Admitting: Oncology

## 2016-03-23 ENCOUNTER — Other Ambulatory Visit (HOSPITAL_COMMUNITY): Payer: Self-pay | Admitting: *Deleted

## 2016-03-23 DIAGNOSIS — K909 Intestinal malabsorption, unspecified: Secondary | ICD-10-CM

## 2016-03-24 ENCOUNTER — Ambulatory Visit (HOSPITAL_COMMUNITY)
Admission: RE | Admit: 2016-03-24 | Discharge: 2016-03-24 | Disposition: A | Discharge status: Home / Self Care | Payer: 59 | Source: Ambulatory Visit | Attending: Oncology | Admitting: Oncology

## 2016-03-24 DIAGNOSIS — K909 Intestinal malabsorption, unspecified: Secondary | ICD-10-CM | POA: Diagnosis not present

## 2016-03-24 MED ORDER — SODIUM CHLORIDE 0.9 % IV SOLN
510.0000 mg | Freq: Once | INTRAVENOUS | Status: AC
  Administered 2016-03-24: 510 mg via INTRAVENOUS
  Filled 2016-03-24: qty 17

## 2016-03-31 MED FILL — DEXILANT DR 60 MG CAPSULE: 60 | 90 days supply | Qty: 90 | Fill #0

## 2016-04-01 DIAGNOSIS — M9902 Segmental and somatic dysfunction of thoracic region: Secondary | ICD-10-CM | POA: Diagnosis not present

## 2016-04-29 DIAGNOSIS — M9902 Segmental and somatic dysfunction of thoracic region: Secondary | ICD-10-CM | POA: Diagnosis not present

## 2016-05-01 MED FILL — clonazePAM 1 MG TABS: 1 | 30 days supply | Qty: 15 | Fill #1

## 2016-05-01 MED FILL — ESTRADIOL 0.1 MG PATCH: 0.1 | 84 days supply | Qty: 24 | Fill #0

## 2016-05-12 DIAGNOSIS — E039 Hypothyroidism, unspecified: Secondary | ICD-10-CM | POA: Diagnosis not present

## 2016-05-20 MED FILL — LEVOTHYROXINE 112 MCG TAB: 112 | 90 days supply | Qty: 90 | Fill #0

## 2016-05-27 DIAGNOSIS — M9902 Segmental and somatic dysfunction of thoracic region: Secondary | ICD-10-CM | POA: Diagnosis not present

## 2016-05-27 DIAGNOSIS — M109 Gout, unspecified: Secondary | ICD-10-CM | POA: Diagnosis not present

## 2016-06-22 MED FILL — DEXILANT DR 60 MG CAPSULE: 60 | 90 days supply | Qty: 90 | Fill #1

## 2016-06-24 DIAGNOSIS — M9902 Segmental and somatic dysfunction of thoracic region: Secondary | ICD-10-CM | POA: Diagnosis not present

## 2016-07-27 ENCOUNTER — Encounter: Payer: Self-pay | Admitting: Physician Assistant

## 2016-07-27 ENCOUNTER — Ambulatory Visit (INDEPENDENT_AMBULATORY_CARE_PROVIDER_SITE_OTHER): Payer: 59 | Admitting: Physician Assistant

## 2016-07-27 VITALS — BP 96/60 | HR 61 | Temp 97.8°F | Ht 66.0 in | Wt 95.0 lb

## 2016-07-27 DIAGNOSIS — M546 Pain in thoracic spine: Secondary | ICD-10-CM

## 2016-07-27 DIAGNOSIS — G8929 Other chronic pain: Secondary | ICD-10-CM | POA: Diagnosis not present

## 2016-07-27 NOTE — Progress Notes (Signed)
Marilyn Collins is a 62 y.o. female here to [place] and back issue.  I acted as a Education administrator for [employer], PA_C Anselmo Pickler, LPN  History of Present Illness:   Chief Complaint  Patient presents with  . Establish Care  . Back Pain    Mid back, worse past 5-6 months    Acute Concerns: Mid-back pain -- has been dealing with back pain for several years. Over the past 6 months she notes that she has had worsening pain, but specifically it feels like a muscle spasm. She has a very thin body habitus and states that she and her husband can feel a muscle spasm that occasionally occurs when she is having these pains.  In April 2007 she had a MRI of her thoracic spine that showed: 1. No focal pathology in the thoracic spine.  2. Mild leftward curvature at T8 is unlikely to be of any significance to the patient.  3. Hemangiomata T8, 11 and 12. She has taken NSAIDs for years for arthritis but had to stop because it caused GI distress. She is not on Dexilant daily. She has had chronic back pain for quite some time and has been followed by chiropractor. Has tried baclofen in the past, but made her really dizzy, and therefore has not tried any other muscle relaxers. She also notes a history of scoliosis. No incontinence in bowel or bladder.  No unintentional weight loss. Has had skin cancer but no other personal hx of cancer. Pain may radiate to her R flank. Last xray completed by the chiropractor in the past 6 months. For pain she has been taking very seldom Dolobid. She does work out and do some [employer] training however she denies any significant incidence of pulling a muscle or trauma.  Health Maintenance: Immunizations -- up to date Colonoscopy -- up to date Mammogram -- up to date PAP -- up to date Bone Density -- has had several years ago Weight -- Weight: 95 lb (43.1 kg)   Depression screen [facility] 2/9 07/27/2016  Decreased Interest 0  Down, Depressed, Hopeless 0  PHQ - 2 Score 0    No  flowsheet data found.   Past Medical History:  Diagnosis Date  . Anxiety   . Arthritis   . Celiac disease 07/20/2011  . Chronic idiopathic monocytosis 07/20/2011  . Dysphagia 12/2012   responsive to dexilant  . GERD (gastroesophageal reflux disease)   . H/O eating disorder   . Hypothyroidism   . Iron deficiency anemia, unspecified 07/20/2011  . Migraine headache   . Monocytosis (symptomatic) 07/20/2011  . Other specified intestinal malabsorption 07/20/2011     Social History   Social History  . Marital status: Married    Spouse name: N/A  . Number of children: N/A  . Years of education: N/A   Occupational History  . Not on file.   Social History Main Topics  . Smoking status: Never Smoker  . Smokeless tobacco: Never Used  . Alcohol use No  . Drug use: No  . Sexual activity: Yes    Birth control/ protection: Surgical   Other Topics Concern  . Not on file   Social History Narrative   RN at [employer]   Married   2 boys       Past Surgical History:  Procedure Laterality Date  . ABDOMINAL HYSTERECTOMY    . COLONOSCOPY  06/2003   neg  . DIAGNOSTIC LAPAROSCOPY    . ESOPHAGOGASTRODUODENOSCOPY N/A 10/20/2012  Procedure: ESOPHAGOGASTRODUODENOSCOPY (EGD);  Surgeon: Cleotis Nipper, MD;  Location: [facility];  Service: Endoscopy;  Laterality: N/A;  . FLEXIBLE SIGMOIDOSCOPY  2005  . SINUS IRRIGATION    . TMJ ARTHROPLASTY      Family History  Problem Relation Age of Onset  . Cancer Mother   . Heart disease Mother   . Heart disease Father   . Hyperlipidemia Father   . Hypertension Father   . Stroke Father   . Hypertension Brother   . Cancer Maternal Grandmother   . Heart disease Paternal Grandmother   . Stroke Maternal Grandfather     Allergies  Allergen Reactions  . Morphine And Related Nausea And Vomiting  . Venofer [Ferric Oxide] Anaphylaxis     Current Medications:   Current Outpatient Prescriptions:  .  bifidobacterium infantis (ALIGN)  capsule, Take 1 capsule by mouth daily., Disp: , Rfl:  .  clonazePAM (KLONOPIN) 0.5 MG tablet, Take 0.25 mg by mouth at bedtime., Disp: , Rfl:  .  dexlansoprazole (DEXILANT) 60 MG capsule, Take 60 mg by mouth daily., Disp: , Rfl:  .  diflunisal (DOLOBID) 500 MG TABS, Take 500 mg by mouth as needed. , Disp: , Rfl:  .  docusate sodium (COLACE) 100 MG capsule, Take 100 mg by mouth daily., Disp: , Rfl:  .  estradiol (VIVELLE-DOT) 0.05 MG/24HR, Place 1 patch onto the skin 2 (two) times a week., Disp: , Rfl:  .  levothyroxine (SYNTHROID, LEVOTHROID) 112 MCG tablet, Take 112 mcg by mouth daily. , Disp: , Rfl:  .  loratadine (CLARITIN) 10 MG tablet, Take 10 mg by mouth daily., Disp: , Rfl:  .  loratadine (CLARITIN) 10 MG tablet, Take 10 mg by mouth., Disp: , Rfl:  .  Methylcellulose, Laxative, (CITRUCEL PO), Take 1 scoop by mouth daily., Disp: , Rfl:  .  Multiple Vitamin (MULTIVITAMIN) tablet, Take 1 tablet by mouth. Takes 1 tablet  M, W, F., Disp: , Rfl:    Review of Systems:   Review of Systems  Constitutional: Negative for chills, fever, malaise/fatigue and weight loss.  HENT: Negative for hearing loss, sinus pain and sore throat.   Eyes: Negative for blurred vision.  Respiratory: Negative for cough and shortness of breath.   Cardiovascular: Negative for chest pain, palpitations and leg swelling.  Gastrointestinal: Positive for heartburn. Negative for abdominal pain, diarrhea, nausea and vomiting.  Genitourinary: Negative for dysuria, frequency and urgency.  Musculoskeletal: Positive for back pain. Negative for myalgias and neck pain.  Skin: Negative for itching and rash.  Neurological: Negative for dizziness, tingling, seizures, loss of consciousness and headaches.  Endo/Heme/Allergies: Negative for polydipsia.  Psychiatric/Behavioral: Negative for depression. The patient is not nervous/anxious.     Vitals:   Vitals:   07/27/16 1422  BP: 96/60  Pulse: 61  Temp: 97.8 F (36.6 C)   TempSrc: Oral  SpO2: 99%  Weight: 95 lb (43.1 kg)  Height: 5' 6"  (1.676 m)     Body mass index is 15.33 kg/m.  Physical Exam:   Physical Exam  Constitutional: She appears well-developed. She is cooperative.  Non-toxic appearance. She does not have a sickly appearance. She does not appear ill. No distress.  Cardiovascular: Normal rate, regular rhythm, S1 normal, S2 normal, normal heart sounds and normal pulses.   No LE edema  Pulmonary/Chest: Effort normal and breath sounds normal.  Musculoskeletal:  Tenderness to right paraspinal muscle of mid-thoracic back, no active spasm felt. No rash, ecchymosis, or decreased ROM.  Neurological: She  is alert.  Nursing note and vitals reviewed.    Assessment and Plan:    Kemiyah was seen today for establish care and back pain.  Diagnoses and all orders for this visit:  Chronic right-sided thoracic back pain   Discussed treatment options. She is reluctant to take prednisone or receive steroid injection. She is very sensitive to baclofen and does not want to try another muscle relaxer. She declines pain medication or lastly, we discussed use of valium, which she also declines. She is interested in meeting with Dr. Paulla Fore in sports medicine to review additional options. I agree with this plan. We also discussed trying to obtain her xray images from her chiropractor so Dr. Paulla Fore can review them at his convenience. She is agreeable to plan.  . Reviewed expectations re: course of current medical issues. . Discussed self-management of symptoms. . Outlined signs and symptoms indicating need for more acute intervention. . Patient verbalized understanding and all questions were answered. . See orders for this visit as documented in the electronic medical record. . Patient received an After-Visit Summary.  CMA or LPN served as scribe during this visit. History, Physical, and Plan performed by medical provider. Documentation and orders reviewed and  attested to.  [name], PA-C

## 2016-08-03 ENCOUNTER — Ambulatory Visit (INDEPENDENT_AMBULATORY_CARE_PROVIDER_SITE_OTHER): Payer: 59 | Admitting: Sports Medicine

## 2016-08-03 ENCOUNTER — Encounter: Payer: Self-pay | Admitting: Sports Medicine

## 2016-08-03 ENCOUNTER — Telehealth: Payer: Self-pay | Admitting: Physician Assistant

## 2016-08-03 VITALS — BP 92/62 | HR 62 | Ht 66.0 in | Wt 95.0 lb

## 2016-08-03 DIAGNOSIS — M546 Pain in thoracic spine: Secondary | ICD-10-CM | POA: Diagnosis not present

## 2016-08-03 DIAGNOSIS — M9902 Segmental and somatic dysfunction of thoracic region: Secondary | ICD-10-CM | POA: Diagnosis not present

## 2016-08-03 DIAGNOSIS — M9903 Segmental and somatic dysfunction of lumbar region: Secondary | ICD-10-CM

## 2016-08-03 DIAGNOSIS — M9904 Segmental and somatic dysfunction of sacral region: Secondary | ICD-10-CM | POA: Diagnosis not present

## 2016-08-03 DIAGNOSIS — G8929 Other chronic pain: Secondary | ICD-10-CM | POA: Diagnosis not present

## 2016-08-03 NOTE — Progress Notes (Signed)
OFFICE VISIT NOTE [name]. Tiarna Koppen, [place] at [facility] [phone]  TIFFAY PINETTE - 61 y.o. female MRN [id number]  Date of birth: 07/20/1955  Visit Date: 08/03/2016  PCP: [name], PA   Referred by: Corine Shelter, PA-C  Burlene Arnt, CMA acting as scribe for Dr. Paulla Fore.  SUBJECTIVE:   Chief Complaint  Patient presents with  . Chronic right-sided thoracic back pain   HPI: As below and per problem based documentation when appropriate.  Pt presents today with complaint of right sided thoracic back pain.  Pain stared several years ago but has worsened over the past 6 months.  Pt has hx of scoliosis. She had MRI done April 2017.   The pain is described as spasms and is rated as 9/10 when at its worst but 3/10 currently. She can feel a knot when the spasm is present and her husband will knead the muscle.  Worsened with sitting in recliner. Its an older recliner with little support. Pain seems to be positional per her chiropractor.  Therapies tried include : Seeing chiropractor, NSAIDs but d/c d/t GI distress, Baclofen but then causes dizziness. She ices the back and has tried using a TENS unit with temporary relief. She says it more so distracts her from the pain more than relieving.  She does Body Pump twice a week and is sore after that, squats seem to irritate the back more because she has a weighted bar on her back. She also has pain when doing bicep curls. She has tried stretching and doing Yoga with little relief. She says that the back is tender to touch. The other thing that alleviates the pain is when her husband "rubs the knot".   Other associated symptoms include: Pain radiates to the right flank and around the ribs. She has chronic neck pain that seems to be worse when turning head side to side. She denies pain or discomfort with deep breathing.  Pt denies fever, chills, night sweats.      Review of Systems    Constitutional: Negative for chills and fever.  Respiratory: Negative for shortness of breath and wheezing.   Cardiovascular: Positive for palpitations. Negative for chest pain.  Musculoskeletal: Positive for back pain and neck pain. Negative for falls.  Neurological: Negative for dizziness, tingling and headaches.  Endo/Heme/Allergies: Does not bruise/bleed easily.    Otherwise per HPI.  HISTORY & PERTINENT PRIOR DATA:  No specialty comments available. She reports that she has never smoked. She has never used smokeless tobacco. No results for input(s): HGBA1C, LABURIC in the last 8760 hours. Medications & Allergies reviewed per EMR Patient Active Problem List   Diagnosis Date Noted  . Chronic right-sided thoracic back pain 08/03/2016  . Celiac disease 07/20/2011  . Iron deficiency anemia 07/20/2011  . Other iron deficiency anemia 07/20/2011  . Chronic idiopathic monocytosis 07/20/2011   Past Medical History:  Diagnosis Date  . Anxiety   . Arthritis   . Celiac disease 07/20/2011  . Chronic idiopathic monocytosis 07/20/2011  . Dysphagia 12/2012   responsive to dexilant  . GERD (gastroesophageal reflux disease)   . H/O eating disorder   . Hypothyroidism   . Iron deficiency anemia, unspecified 07/20/2011  . Migraine headache   . Monocytosis (symptomatic) 07/20/2011  . Other specified intestinal malabsorption 07/20/2011   Family History  Problem Relation Age of Onset  . Cancer Mother   . Heart disease Mother   . Heart disease Father   .  Hyperlipidemia Father   . Hypertension Father   . Stroke Father   . Hypertension Brother   . Cancer Maternal Grandmother   . Heart disease Paternal Grandmother   . Stroke Maternal Grandfather    Past Surgical History:  Procedure Laterality Date  . ABDOMINAL HYSTERECTOMY    . COLONOSCOPY  06/2003   neg  . DIAGNOSTIC LAPAROSCOPY    . ESOPHAGOGASTRODUODENOSCOPY N/A 10/20/2012   Procedure: ESOPHAGOGASTRODUODENOSCOPY (EGD);  Surgeon: Cleotis Nipper, MD;  Location: [facility];  Service: Endoscopy;  Laterality: N/A;  . FLEXIBLE SIGMOIDOSCOPY  2005  . SINUS IRRIGATION    . TMJ ARTHROPLASTY     Social History   Occupational History  . Not on file.   Social History Main Topics  . Smoking status: Never Smoker  . Smokeless tobacco: Never Used  . Alcohol use No  . Drug use: No  . Sexual activity: Yes    Birth control/ protection: Surgical    OBJECTIVE:  VS:  HT:5' 6"  (167.6 cm)   WT:95 lb (43.1 kg)  BMI:15.4    BP:92/62  HR:62bpm  TEMP: ( )  RESP:97 % EXAM: Findings:  Adult female.  No acute distress.  Alert and appropriate.  Bilateral lower extremities overall well aligned.  She has good range of motion of bilateral hips with internal and external rotation but does have a functionally tight left hip flexor.  Hypermobility of the lumbar spine last for her to fully extend her hip but once again this is coming more from lumbar mechanics.  Lower extremity sensation is intact.  Lower extremity myotomes 5+/5.  Trigger point at the thoracolumbar junction on the left.  OSTEOPATHIC/STRUCTURAL EXAM:   Left anterior innominate.  Functionally tight left hip flexor/psoas minor T12-T1 neutral side bent left rotated right Right on right sacral torsion      No results found. ASSESSMENT & PLAN:   Problem List Items Addressed This Visit    Chronic right-sided thoracic back pain - Primary    Symptoms are consistent with psoas minor function contracture with associated trigger point. Osteopathic manipulation performed as below.  Would benefit greatly from dry needling and referral placed for physical therapy.  Functional psoas stretching provided from foundations training  PROCEDURE NOTE : OSTEOPATHIC MANIPULATION The decision today to treat with Osteopathic Manipulative Therapy (OMT) was based on physical exam findings. Verbal consent was obtained after after explanation of risks, benefits and potential side effects, including  acute pain flare, post manipulation soreness and need for repeat treatments.  If Cervical manipulation was performed additional time was spent discussing the associated minimal risk of  injury to neurovascular structures.  After consent was obtained manipulation was performed as below:            Regions treated:  Per billing codes          Techniques used:  Direct, MFR, FPR, HVLA, CS  and ART The patient tolerated the treatment well and reported Improved symptoms following treatment today. Patient was given medications, exercises, stretches and lifestyle modifications per AVS and verbally.         Relevant Orders   Ambulatory referral to Physical Therapy    Other Visit Diagnoses    Somatic dysfunction of thoracic region       Somatic dysfunction of lumbar region       Somatic dysfunction of sacral region          Follow-up: Return in about 6 weeks (around 09/14/2016).    CMA/ATC served  as scribe during this visit. History, Physical, and Plan performed by medical provider. Documentation and orders reviewed and attested to.      Teresa Coombs, [facility] Physician

## 2016-08-03 NOTE — Patient Instructions (Signed)
Also check out the YouTube Video from Dr. Minerva Ends.   "Powerful Posture and Pain Relief: 12 minutes of Foundation Training" - [email or web address]://[email or web address].[email or web address]

## 2016-08-03 NOTE — Telephone Encounter (Signed)
ROI fax to [facility]

## 2016-08-04 NOTE — Assessment & Plan Note (Signed)
Symptoms are consistent with psoas minor function contracture with associated trigger point. Osteopathic manipulation performed as below.  Would benefit greatly from dry needling and referral placed for physical therapy.  Functional psoas stretching provided from foundations training  PROCEDURE NOTE : OSTEOPATHIC MANIPULATION The decision today to treat with Osteopathic Manipulative Therapy (OMT) was based on physical exam findings. Verbal consent was obtained after after explanation of risks, benefits and potential side effects, including acute pain flare, post manipulation soreness and need for repeat treatments.  If Cervical manipulation was performed additional time was spent discussing the associated minimal risk of  injury to neurovascular structures.  After consent was obtained manipulation was performed as below:            Regions treated:  Per billing codes          Techniques used:  Direct, MFR, FPR, HVLA, CS  and ART The patient tolerated the treatment well and reported Improved symptoms following treatment today. Patient was given medications, exercises, stretches and lifestyle modifications per AVS and verbally.

## 2016-08-10 ENCOUNTER — Other Ambulatory Visit: Payer: Self-pay | Admitting: Physician Assistant

## 2016-08-10 DIAGNOSIS — K900 Celiac disease: Secondary | ICD-10-CM | POA: Diagnosis not present

## 2016-08-10 DIAGNOSIS — K5904 Chronic idiopathic constipation: Secondary | ICD-10-CM | POA: Diagnosis not present

## 2016-08-10 DIAGNOSIS — R1032 Left lower quadrant pain: Secondary | ICD-10-CM | POA: Diagnosis not present

## 2016-08-10 DIAGNOSIS — R590 Localized enlarged lymph nodes: Secondary | ICD-10-CM | POA: Diagnosis not present

## 2016-08-10 MED FILL — ANASPAZ 0.125 MG TABLET ODT: 0.125 | 30 days supply | Qty: 60 | Fill #0

## 2016-08-12 MED FILL — LEVOTHYROXINE 112 MCG TAB: 112 | 90 days supply | Qty: 90 | Fill #1

## 2016-08-13 ENCOUNTER — Ambulatory Visit
Admission: RE | Admit: 2016-08-13 | Discharge: 2016-08-13 | Disposition: A | Discharge status: Home / Self Care | Payer: 59 | Source: Ambulatory Visit | Attending: Physician Assistant | Admitting: Physician Assistant

## 2016-08-13 DIAGNOSIS — R1032 Left lower quadrant pain: Secondary | ICD-10-CM

## 2016-08-13 MED ORDER — IOPAMIDOL (ISOVUE-300) INJECTION 61%
100.0000 mL | Freq: Once | INTRAVENOUS | Status: AC | PRN
  Administered 2016-08-13: 100 mL via INTRAVENOUS

## 2016-08-13 MED FILL — clonazePAM 1 MG TABS: 1 | 8 days supply | Qty: 15 | Fill #0

## 2016-08-17 ENCOUNTER — Ambulatory Visit (INDEPENDENT_AMBULATORY_CARE_PROVIDER_SITE_OTHER): Payer: 59 | Admitting: Physical Therapy

## 2016-08-17 DIAGNOSIS — M546 Pain in thoracic spine: Secondary | ICD-10-CM

## 2016-08-17 DIAGNOSIS — M791 Myalgia, unspecified site: Secondary | ICD-10-CM

## 2016-08-17 NOTE — Therapy (Signed)
[facility] [address] [place], [place], [address] Phone: [phone]   Fax:  [phone]  Physical Therapy Evaluation  Patient Details  Name: Marilyn Collins MRN: [id number] Date of Birth: Oct 18, 1955 Referring Provider: Dr. Teresa Coombs  Encounter Date: 08/17/2016      PT End of Session - 08/17/16 1553    Visit Number 1   Number of Visits 6   Date for PT Re-Evaluation 09/28/16   Authorization Type [place] [employer]   PT Start Time 1508   PT Stop Time 1550   PT Time Calculation (min) 42 min   Activity Tolerance Patient tolerated treatment well   Behavior During Therapy [facility] for tasks assessed/performed      Past Medical History:  Diagnosis Date  . Anxiety   . Arthritis   . Celiac disease 07/20/2011  . Chronic idiopathic monocytosis 07/20/2011  . Dysphagia 12/2012   responsive to dexilant  . GERD (gastroesophageal reflux disease)   . H/O eating disorder   . Hypothyroidism   . Iron deficiency anemia, unspecified 07/20/2011  . Migraine headache   . Monocytosis (symptomatic) 07/20/2011  . Other specified intestinal malabsorption 07/20/2011    Past Surgical History:  Procedure Laterality Date  . ABDOMINAL HYSTERECTOMY    . COLONOSCOPY  06/2003   neg  . DIAGNOSTIC LAPAROSCOPY    . ESOPHAGOGASTRODUODENOSCOPY N/A 10/20/2012   Procedure: ESOPHAGOGASTRODUODENOSCOPY (EGD);  Surgeon: Cleotis Nipper, MD;  Location: [facility];  Service: Endoscopy;  Laterality: N/A;  . FLEXIBLE SIGMOIDOSCOPY  2005  . SINUS IRRIGATION    . TMJ ARTHROPLASTY      There were no vitals filed for this visit.       Subjective Assessment - 08/17/16 1511    Subjective Pt is a 61 y/o female who presents to OPPT for rt sided lower thoracic and lumbar pain x many years.  Pt reports exacerbation of symptoms x 6 months.  Pt reports symptoms consistent with muscle spasm.  Pt reports symptoms better following OMT but returned after a few days.     Pertinent  History see snapshot   Limitations House hold activities;Sitting   How long can you sit comfortably? sitting in recliner aggravates it   Patient Stated Goals improve pain; tolerate weight/strengthening classes without making pain worse   Currently in Pain? Yes   Pain Score 1   up to 7-8/10   Pain Location Back   Pain Orientation Right   Pain Descriptors / Indicators Spasm;Cramping   Pain Type Chronic pain   Pain Onset More than a month ago   Pain Frequency Constant   Aggravating Factors  sitting in recliner, pulling doors with Rt hand; using RUE for pulling   Pain Relieving Factors massage            [facility] PT Assessment - 08/17/16 1516      Assessment   Medical Diagnosis Rt thoracolumbar pain   Referring Provider Dr. Teresa Coombs   Onset Date/Surgical Date --  exacerbated 6 months ago   Hand Dominance Right   Next MD Visit 09/16/16   Prior Therapy chiropractor, otherwise none     Precautions   Precautions None     Restrictions   Weight Bearing Restrictions No     Balance Screen   Has the patient fallen in the past 6 months No   Has the patient had a decrease in activity level because of a fear of falling?  No   Is the patient reluctant  to leave their home because of a fear of falling?  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   Leisure exercise 5 days/wk - 40-45 min cardio then strengthening/yoga/barre; reading, riding motorcycles     Cognition   Overall Cognitive Status Within Functional Limits for tasks assessed     Posture/Postural Control   Posture/Postural Control Postural limitations   Postural Limitations Rounded Shoulders;Forward head;Increased thoracic kyphosis     ROM / Strength   AROM / PROM / Strength AROM     AROM   Overall AROM  Comments thoracic and lumbar ROM WNL except pain with returning to neutral from flexion     Palpation   Palpation comment active trigger points noted in lower lats and paraspinals on Rt side            Objective measurements completed on examination: See above findings.          [facility] Adult PT Treatment/Exercise - 08/17/16 1516      Self-Care   Self-Care Other Self-Care Comments   Other Self-Care Comments  instructed in self myofascial release with ball and childs pose and standing lat stretch     Manual Therapy   Manual Therapy Myofascial release   Myofascial Release Rt lower lats and paraspinals          Trigger Point Dry Needling - 08/17/16 1551    Consent Given? Yes   Education Handout Provided Yes   Muscles Treated Lower Body --  lower lats and paraspinals on Rt with twitch response              PT Education - 08/17/16 1553    Education provided Yes   Education Details DN, self stretch and myofascial release with ball   Person(s) Educated Patient   Methods Explanation;Handout;Demonstration   Comprehension Verbalized understanding;Returned demonstration             PT Long Term Goals - 08/17/16 1557      PT LONG TERM GOAL #1   Title independent with HEP (09/28/16)   Time 6   Period Weeks   Status New     PT LONG TERM GOAL #2   Title verbalize understanding of posture/body mechanics to decrease risk of reinjury (09/28/16)   Time 6   Period Weeks   Status New     PT LONG TERM GOAL #3   Title perform thoracolumbar ROM without increase in pain for improved mobility (09/28/16)   Time 6   Period Weeks   Status New     PT LONG TERM GOAL #4   Title tolerate full work day without increase in pain for improved activity tolerance (09/28/16)   Time 6   Period Weeks   Status New                Plan - 08/17/16 1554    Clinical Impression Statement Pt is a 61 y/o female who presents to OPPT for isolated pain on Rt thoracolumbar  region.  Pt with active trigger points in lower lats and paraspinals.  DN performed today with good twitch responses noted as well as decreased tightness following with manual.  Instructed in self myofascial release with ball and stretches.  Will benefit from PT to address deficits noted  and decrease risk of reinjury.   Clinical Presentation Stable   Clinical Decision Making Low   Rehab Potential Good   PT Frequency 1x / week   PT Duration 6 weeks   PT Treatment/Interventions ADLs/Self Care Home Management;Cryotherapy;Electrical Stimulation;Moist Heat;Ultrasound;Therapeutic exercise;Therapeutic activities;Functional mobility training;Patient/family education;Taping;Dry needling;Manual techniques   PT Next Visit Plan assess response to DN, HEP for stretching, assess posture in standing and add to HEP, posture/body mechanics education   Consulted and Agree with Plan of Care Patient      Patient will benefit from skilled therapeutic intervention in order to improve the following deficits and impairments:  Postural dysfunction, Pain, Increased muscle spasms, Increased fascial restricitons  Visit Diagnosis: Pain in thoracic spine - Plan: PT plan of care cert/re-cert  Myalgia - Plan: PT plan of care cert/re-cert     Problem List Patient Active Problem List   Diagnosis Date Noted  . Chronic right-sided thoracic back pain 08/03/2016  . Celiac disease 07/20/2011  . Iron deficiency anemia 07/20/2011  . Other iron deficiency anemia 07/20/2011  . Chronic idiopathic monocytosis 07/20/2011      Laureen Abrahams, PT, DPT 08/17/16 4:01 PM    [facility] [place], [place], [address] Phone: [phone]   Fax:  [phone]  Name: Marilyn Collins MRN: [id number] Date of Birth: March 25, 1955

## 2016-08-17 NOTE — Patient Instructions (Signed)
Trigger Point Dry Needling  . What is Trigger Point Dry Needling (DN)? o DN is a physical therapy technique used to treat muscle pain and dysfunction. Specifically, DN helps deactivate muscle trigger points (muscle knots).  o A thin filiform needle is used to penetrate the skin and stimulate the underlying trigger point. The goal is for a local twitch response (LTR) to occur and for the trigger point to relax. No medication of any kind is injected during the procedure.   . What Does Trigger Point Dry Needling Feel Like?  o The procedure feels different for each individual patient. Some patients report that they do not actually feel the needle enter the skin and overall the process is not painful. Very mild bleeding may occur. However, many patients feel a deep cramping in the muscle in which the needle was inserted. This is the local twitch response.   Marland Kitchen How Will I feel after the treatment? o Soreness is normal, and the onset of soreness may not occur for a few hours. Typically this soreness does not last longer than two days.  o Bruising is uncommon, however; ice can be used to decrease any possible bruising.  o In rare cases feeling tired or nauseous after the treatment is normal. In addition, your symptoms may get worse before they get better, this period will typically not last longer than 24 hours.   . What Can I do After My Treatment? o Increase your hydration by drinking more water for the next 24 hours. o You may place ice or heat on the areas treated that have become sore, however, do not use heat on inflamed or bruised areas. Heat often brings more relief post needling. o You can continue your regular activities, but vigorous activity is not recommended initially after the treatment for 24 hours. o DN is best combined with other physical therapy such as strengthening, stretching, and other therapies.    Massage Ball: Turn Left in [employer] and head towards cards.  Turn right at cards and  head toward back of store.  On left before toys will be an end cap bin of children's toys.  The light up ball will be in that bin.

## 2016-08-24 ENCOUNTER — Ambulatory Visit (INDEPENDENT_AMBULATORY_CARE_PROVIDER_SITE_OTHER): Payer: 59 | Admitting: Physical Therapy

## 2016-08-24 DIAGNOSIS — M546 Pain in thoracic spine: Secondary | ICD-10-CM | POA: Diagnosis not present

## 2016-08-24 DIAGNOSIS — M9902 Segmental and somatic dysfunction of thoracic region: Secondary | ICD-10-CM | POA: Diagnosis not present

## 2016-08-24 DIAGNOSIS — G8929 Other chronic pain: Secondary | ICD-10-CM

## 2016-08-24 DIAGNOSIS — M791 Myalgia, unspecified site: Secondary | ICD-10-CM

## 2016-08-24 NOTE — Therapy (Signed)
[facility] [address] [place], [place], [address] Phone: [phone]   Fax:  [phone]  Physical Therapy Treatment  Patient Details  Name: Marilyn Collins MRN: [id number] Date of Birth: [birth year] Referring Provider: Dr. Teresa Coombs  Encounter Date: 08/24/2016      PT End of Session - 08/24/16 1503    Visit Number 2   Number of Visits 6   Date for PT Re-Evaluation 09/28/16   Authorization Type [place] [employer]   PT Start Time 1430   PT Stop Time 1501   PT Time Calculation (min) 31 min   Activity Tolerance Patient tolerated treatment well   Behavior During Therapy [facility] for tasks assessed/performed      Past Medical History:  Diagnosis Date  . Anxiety   . Arthritis   . Celiac disease 07/20/2011  . Chronic idiopathic monocytosis 07/20/2011  . Dysphagia 12/2012   responsive to dexilant  . GERD (gastroesophageal reflux disease)   . H/O eating disorder   . Hypothyroidism   . Iron deficiency anemia, unspecified 07/20/2011  . Migraine headache   . Monocytosis (symptomatic) 07/20/2011  . Other specified intestinal malabsorption 07/20/2011    Past Surgical History:  Procedure Laterality Date  . ABDOMINAL HYSTERECTOMY    . COLONOSCOPY  06/2003   neg  . DIAGNOSTIC LAPAROSCOPY    . ESOPHAGOGASTRODUODENOSCOPY N/A 10/20/2012   Procedure: ESOPHAGOGASTRODUODENOSCOPY (EGD);  Surgeon: Cleotis Nipper, MD;  Location: [facility];  Service: Endoscopy;  Laterality: N/A;  . FLEXIBLE SIGMOIDOSCOPY  2005  . SINUS IRRIGATION    . TMJ ARTHROPLASTY      There were no vitals filed for this visit.      Subjective Assessment - 08/24/16 1432    Subjective feels "a little worse." and feels like it's constant and tight; "it won't release."   How long can you sit comfortably? sitting in recliner aggravates it   Patient Stated Goals improve pain; tolerate weight/strengthening classes without making pain worse   Currently in Pain? Yes   Pain  Score 6    Pain Location Back   Pain Orientation Right;Mid   Pain Descriptors / Indicators Spasm;Cramping   Pain Type Chronic pain   Pain Onset More than a month ago   Pain Frequency Constant   Aggravating Factors  sitting in recliner, pulling doors with Rt hand; using RUE for pulling   Pain Relieving Factors massage                         [facility] Adult PT Treatment/Exercise - 08/24/16 1435      Self-Care   Other Self-Care Comments  discussed posture and body mechanics with work and ADLs; and recommended to limited twisting, especially to Rt - pt verbalized understanding     Exercises   Exercises Lumbar     Lumbar Exercises: Stretches   Quadruped Mid Back Stretch Limitations seated midline and to Lt 3x30 sec each with black physioball     Lumbar Exercises: Standing   Row Both;10 reps;Theraband   Theraband Level (Row) Level 3 (Green)   Shoulder Extension Both;10 reps;Theraband   Theraband Level (Shoulder Extension) Level 3 (Green);Level 2 (Red)   Shoulder Extension Limitations decreased to red due to increased difficulty with green     Lumbar Exercises: Prone   Straight Leg Raise 10 reps   Opposite Arm/Leg Raise Right arm/Left leg;Left arm/Right leg;5 reps   Opposite Arm/Leg Raise Limitations increase in symptoms with Rt  arm/Lt leg   Other Prone Lumbar Exercises educated in prone press stabilization and to perform before above     Manual Therapy   Manual Therapy Joint mobilization;Myofascial release   Joint Mobilization T8-12 UPA on Rt grades 2-3   Myofascial Release Rt lower lats and paraspinals                PT Education - 08/24/16 1503    Education provided Yes   Education Details posture/body International aid/development worker) Educated Patient   Methods Explanation   Comprehension Verbalized understanding             PT Long Term Goals - 08/17/16 1557      PT LONG TERM GOAL #1   Title independent with HEP (09/28/16)   Time 6   Period Weeks    Status New     PT LONG TERM GOAL #2   Title verbalize understanding of posture/body mechanics to decrease risk of reinjury (09/28/16)   Time 6   Period Weeks   Status New     PT LONG TERM GOAL #3   Title perform thoracolumbar ROM without increase in pain for improved mobility (09/28/16)   Time 6   Period Weeks   Status New     PT LONG TERM GOAL #4   Title tolerate full work day without increase in pain for improved activity tolerance (09/28/16)   Time 6   Period Weeks   Status New               Plan - 08/24/16 1504    Clinical Impression Statement Pt reports she feels symptoms may be slightly worse following dry needling so deferred today.  Session today focused on stretching and proper exercise form as well as posture and body mechanics with work responsibilities and ADLs.  May try dry needling next session but deferred at this time.   PT Treatment/Interventions ADLs/Self Care Home Management;Cryotherapy;Electrical Stimulation;Moist Heat;Ultrasound;Therapeutic exercise;Therapeutic activities;Functional mobility training;Patient/family education;Taping;Dry needling;Manual techniques   PT Next Visit Plan HEP for stretching, assess posture in standing and add to HEP, posture/body mechanics education   Consulted and Agree with Plan of Care Patient      Patient will benefit from skilled therapeutic intervention in order to improve the following deficits and impairments:  Postural dysfunction, Pain, Increased muscle spasms, Increased fascial restricitons  Visit Diagnosis: Pain in thoracic spine  Myalgia  Chronic right-sided thoracic back pain     Problem List Patient Active Problem List   Diagnosis Date Noted  . Chronic right-sided thoracic back pain 08/03/2016  . Celiac disease 07/20/2011  . Iron deficiency anemia 07/20/2011  . Other iron deficiency anemia 07/20/2011  . Chronic idiopathic monocytosis 07/20/2011      Laureen Abrahams, PT, DPT 08/24/16 3:06  PM    [facility] [place], [place], [address] Phone: [phone]   Fax:  [phone]  Name: Marilyn Collins MRN: [id number] Date of Birth: 1955-02-26

## 2016-08-31 ENCOUNTER — Ambulatory Visit (INDEPENDENT_AMBULATORY_CARE_PROVIDER_SITE_OTHER): Payer: 59 | Admitting: Physical Therapy

## 2016-08-31 DIAGNOSIS — G8929 Other chronic pain: Secondary | ICD-10-CM | POA: Diagnosis not present

## 2016-08-31 DIAGNOSIS — M546 Pain in thoracic spine: Secondary | ICD-10-CM | POA: Diagnosis not present

## 2016-08-31 DIAGNOSIS — M791 Myalgia, unspecified site: Secondary | ICD-10-CM

## 2016-08-31 NOTE — Therapy (Addendum)
[facility] [address] [place], [place], [address] Phone: [phone]   Fax:  [phone]  Physical Therapy Treatment  Patient Details  Name: Marilyn Collins MRN: [id number] Date of Birth: 04/21/1955 Referring Provider: Dr. Teresa Coombs  Encounter Date: 08/31/2016      PT End of Session - 08/31/16 1333    Visit Number 3   Number of Visits 6   Date for PT Re-Evaluation 09/28/16   Authorization Type [place] [employer]   PT Start Time 1300   PT Stop Time 1345   PT Time Calculation (min) 45 min   Activity Tolerance Patient tolerated treatment well   Behavior During Therapy [facility] for tasks assessed/performed      Past Medical History:  Diagnosis Date  . Anxiety   . Arthritis   . Celiac disease 07/20/2011  . Chronic idiopathic monocytosis 07/20/2011  . Dysphagia 12/2012   responsive to dexilant  . GERD (gastroesophageal reflux disease)   . H/O eating disorder   . Hypothyroidism   . Iron deficiency anemia, unspecified 07/20/2011  . Migraine headache   . Monocytosis (symptomatic) 07/20/2011  . Other specified intestinal malabsorption 07/20/2011    Past Surgical History:  Procedure Laterality Date  . ABDOMINAL HYSTERECTOMY    . COLONOSCOPY  06/2003   neg  . DIAGNOSTIC LAPAROSCOPY    . ESOPHAGOGASTRODUODENOSCOPY N/A 10/20/2012   Procedure: ESOPHAGOGASTRODUODENOSCOPY (EGD);  Surgeon: Cleotis Nipper, MD;  Location: [facility];  Service: Endoscopy;  Laterality: N/A;  . FLEXIBLE SIGMOIDOSCOPY  2005  . SINUS IRRIGATION    . TMJ ARTHROPLASTY      There were no vitals filed for this visit.      Subjective Assessment - 08/31/16 1256    Subjective feels like the stretches have helped some, but still having some tightness   Patient Stated Goals improve pain; tolerate weight/strengthening classes without making pain worse   Currently in Pain? Yes   Pain Score 3    Pain Location Back   Pain Orientation Mid;Right   Pain Descriptors /  Indicators Spasm;Cramping;Dull   Pain Type Chronic pain   Pain Onset More than a month ago   Pain Frequency Constant   Aggravating Factors  sitting in recliner, pulling doors with Rt hand, using RUE for pulling   Pain Relieving Factors massage                         [facility] Adult PT Treatment/Exercise - 08/31/16 1323      Lumbar Exercises: Stretches   Quadruped Mid Back Stretch Limitations seated midline and to Lt 3x30 sec each with black physioball     Modalities   Modalities Electrical Stimulation;Moist Heat     Moist Heat Therapy   Number Minutes Moist Heat 15 Minutes   Moist Heat Location --  thoracic spine     Electrical Stimulation   Electrical Stimulation Location Rt thoracic spine   Electrical Stimulation Action premod   Electrical Stimulation Parameters to tolerance x 15 min   Electrical Stimulation Goals Tone;Pain     Manual Therapy   Myofascial Release Rt lower lats and paraspinals          Trigger Point Dry Needling - 08/31/16 1323    Consent Given? Yes   Education Handout Provided Yes  previous session   Muscles Treated Lower Body --  lower lats and paraspinals on Rt with twitch response  PT Long Term Goals - 08/17/16 1557      PT LONG TERM GOAL #1   Title independent with HEP (09/28/16)   Time 6   Period Weeks   Status New     PT LONG TERM GOAL #2   Title verbalize understanding of posture/body mechanics to decrease risk of reinjury (09/28/16)   Time 6   Period Weeks   Status New     PT LONG TERM GOAL #3   Title perform thoracolumbar ROM without increase in pain for improved mobility (09/28/16)   Time 6   Period Weeks   Status New     PT LONG TERM GOAL #4   Title tolerate full work day without increase in pain for improved activity tolerance (09/28/16)   Time 6   Period Weeks   Status New               Plan - 08/31/16 1333    Clinical Impression Statement Twitch responses noted today  with trigger point dry needling and pt reporting decreased tightness with stretches following TDN.  Reporting some improvement in symptoms since last session.  Will continue to benefit from PT to maximize function.   PT Treatment/Interventions ADLs/Self Care Home Management;Cryotherapy;Electrical Stimulation;Moist Heat;Ultrasound;Therapeutic exercise;Therapeutic activities;Functional mobility training;Patient/family education;Taping;Dry needling;Manual techniques   PT Next Visit Plan HEP for stretching, posture/body mechanics education, TDN and manual/modalities PRN   Consulted and Agree with Plan of Care Patient      Patient will benefit from skilled therapeutic intervention in order to improve the following deficits and impairments:  Postural dysfunction, Pain, Increased muscle spasms, Increased fascial restricitons  Visit Diagnosis: Pain in thoracic spine  Myalgia  Chronic right-sided thoracic back pain     Problem List Patient Active Problem List   Diagnosis Date Noted  . Chronic right-sided thoracic back pain 08/03/2016  . Celiac disease 07/20/2011  . Iron deficiency anemia 07/20/2011  . Other iron deficiency anemia 07/20/2011  . Chronic idiopathic monocytosis 07/20/2011      Laureen Abrahams, PT, DPT 08/31/16 1:36 PM    [facility] [place], [place], [address] Phone: [phone]   Fax:  [phone]  Name: Marilyn Collins MRN: [id number] Date of Birth: May 18, 1955        PHYSICAL THERAPY DISCHARGE SUMMARY  Visits from Start of Care: 3  Current functional level related to goals / functional outcomes: See above   Remaining deficits: Unknown; pt did not return   Education / Equipment: HEP  Plan: Patient agrees to discharge.  Patient goals were not met. Patient is being discharged due to not returning since the last visit.  ?????    Laureen Abrahams, PT, DPT 10/01/16 12:43 PM   [facility] [place], [place], [address] Phone: [phone]  Fax: [phone]

## 2016-09-09 DIAGNOSIS — K5904 Chronic idiopathic constipation: Secondary | ICD-10-CM | POA: Diagnosis not present

## 2016-09-09 DIAGNOSIS — Z1211 Encounter for screening for malignant neoplasm of colon: Secondary | ICD-10-CM | POA: Diagnosis not present

## 2016-09-09 DIAGNOSIS — K900 Celiac disease: Secondary | ICD-10-CM | POA: Diagnosis not present

## 2016-09-16 ENCOUNTER — Ambulatory Visit: Payer: 59 | Admitting: Sports Medicine

## 2016-09-21 DIAGNOSIS — M9902 Segmental and somatic dysfunction of thoracic region: Secondary | ICD-10-CM | POA: Diagnosis not present

## 2016-09-24 MED FILL — ESTRADIOL 0.1 MG PATCH: 0.1 | 84 days supply | Qty: 24 | Fill #1

## 2016-09-24 MED FILL — DEXILANT DR 60 MG CAPSULE: 60 | 90 days supply | Qty: 90 | Fill #0

## 2016-09-28 ENCOUNTER — Other Ambulatory Visit: Payer: Self-pay | Admitting: Radiology

## 2016-09-29 ENCOUNTER — Ambulatory Visit: Payer: Self-pay

## 2016-09-29 ENCOUNTER — Other Ambulatory Visit: Payer: Self-pay

## 2016-09-29 DIAGNOSIS — M542 Cervicalgia: Secondary | ICD-10-CM

## 2016-10-15 DIAGNOSIS — M9902 Segmental and somatic dysfunction of thoracic region: Secondary | ICD-10-CM | POA: Diagnosis not present

## 2016-10-20 ENCOUNTER — Ambulatory Visit (INDEPENDENT_AMBULATORY_CARE_PROVIDER_SITE_OTHER): Payer: 59 | Admitting: Sports Medicine

## 2016-10-20 ENCOUNTER — Encounter: Payer: Self-pay | Admitting: Sports Medicine

## 2016-10-20 VITALS — BP 102/74 | HR 69 | Ht 66.0 in | Wt 94.8 lb

## 2016-10-20 DIAGNOSIS — G8929 Other chronic pain: Secondary | ICD-10-CM | POA: Diagnosis not present

## 2016-10-20 DIAGNOSIS — M791 Myalgia, unspecified site: Secondary | ICD-10-CM

## 2016-10-20 DIAGNOSIS — M9901 Segmental and somatic dysfunction of cervical region: Secondary | ICD-10-CM

## 2016-10-20 DIAGNOSIS — M546 Pain in thoracic spine: Secondary | ICD-10-CM

## 2016-10-20 DIAGNOSIS — M9903 Segmental and somatic dysfunction of lumbar region: Secondary | ICD-10-CM | POA: Diagnosis not present

## 2016-10-20 DIAGNOSIS — M9902 Segmental and somatic dysfunction of thoracic region: Secondary | ICD-10-CM | POA: Diagnosis not present

## 2016-10-20 MED ORDER — DICLOFENAC SODIUM 2 % TD SOLN: 1.0000 "application " | Freq: Two times a day (BID) | TRANSDERMAL | 0 refills | Status: AC

## 2016-10-20 MED ORDER — DICLOFENAC SODIUM 2 % TD SOLN: 1.0000 "application " | Freq: Two times a day (BID) | TRANSDERMAL | 2 refills | Status: DC

## 2016-10-20 NOTE — Patient Instructions (Signed)

## 2016-10-20 NOTE — Progress Notes (Signed)
OFFICE VISIT NOTE Marilyn Collins. Marilyn Collins, [place] at [facility] [phone]  Marilyn Collins - 61 y.o. female MRN [id number]  Date of birth: 1955-07-08  Visit Date: 10/20/2016  PCP: [name], PA   Referred by: [name], PA  [place], [place] acting as scribe for Dr. Paulla Fore.  SUBJECTIVE:  No chief complaint on file.  HPI: As below and per problem based documentation when appropriate.  Marilyn Collins is an established patient presenting today in follow-up of RT-sided thoracic back pain. She was last seen 08/03/16 and had OMT. She has done foundation training exercises, and dry needling.   She had a few days of relief from pain after OMT but then the pain came back. She has tried dry-needling with some short term relief but pain also comes back and sometimes is worse than before the the dry needling. 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 Pain seems to be worse after working during the weekends. She feels that the longer she is "physically up doing stuff" the pain flares up. She also has more pain when her posture is poor. She tried not to take NSAIDs because the cause GI upset. She has been seeing a chiropractor once a month and sometimes she will feel better after her visits and other times she doesn't feel much of a difference. She has used Stage manager patches with minimal relief.     Review of Systems  Cardiovascular: Positive for palpitations.  Musculoskeletal: Positive for back pain.    Otherwise per HPI.  HISTORY & PERTINENT PRIOR DATA:  No specialty comments available. She reports that  has never smoked. she has never used smokeless tobacco. No results for input(s): HGBA1C, LABURIC in the last 8760 hours. Medications & Allergies reviewed per EMR Patient Active Problem List   Diagnosis Date Noted  . Neck pain 11/20/2016  . Chronic right-sided thoracic back pain  08/03/2016  . Celiac disease 07/20/2011  . Iron deficiency anemia 07/20/2011  . Other iron deficiency anemia 07/20/2011  . Chronic idiopathic monocytosis 07/20/2011   Past Medical History:  Diagnosis Date  . Anxiety   . Arthritis   . Celiac disease 07/20/2011  . Chronic idiopathic monocytosis 07/20/2011  . Dysphagia 12/2012   responsive to dexilant  . GERD (gastroesophageal reflux disease)   . H/O eating disorder   . Hypothyroidism   . Iron deficiency anemia, unspecified 07/20/2011  . Migraine headache   . Monocytosis (symptomatic) 07/20/2011  . Other specified intestinal malabsorption 07/20/2011   Family History  Problem Relation Age of Onset  . Cancer Mother   . Heart disease Mother   . Heart disease Father   . Hyperlipidemia Father   . Hypertension Father   . Stroke Father   . Hypertension Brother   . Cancer Maternal Grandmother   . Heart disease Paternal Grandmother   . Stroke Maternal Grandfather    Past Surgical History:  Procedure Laterality Date  . ABDOMINAL HYSTERECTOMY    . COLONOSCOPY  06/2003   neg  . DIAGNOSTIC LAPAROSCOPY    . ESOPHAGOGASTRODUODENOSCOPY (EGD) N/A 10/20/2012   Performed by Cleotis Nipper, MD at [facility]  . FLEXIBLE SIGMOIDOSCOPY  2005  . SINUS IRRIGATION    . TMJ ARTHROPLASTY     Social History   Occupational History  . Not on file  Tobacco Use  . Smoking status: Never Smoker  . Smokeless tobacco:  Never Used  Substance and Sexual Activity  . Alcohol use: No    Alcohol/week: 0.0 oz  . Drug use: No  . Sexual activity: Yes    Birth control/protection: Surgical    OBJECTIVE:  VS:  HT:5' 6"  (167.6 cm)   WT:94 lb 12.8 oz (43 kg)  BMI:15.31    BP:102/74  HR:69bpm  TEMP: ( )  RESP:98 % EXAM: Findings:  Adult female.  No acute distress.  Alert and appropriate.  Bilateral lower extremities overall well aligned.   She has good range of motion of bilateral hips with internal and external rotation but does have a functionally  tight left hip flexor.   Lower extremity sensation is intact.  Lower extremity myotomes 5+/5.  Trigger point at the thoracolumbar junction on the left.     No results found. ASSESSMENT & PLAN:     ICD-10-CM   1. Pain in thoracic spine M54.6   2. Myalgia M79.1   3. Chronic right-sided thoracic back pain M54.6    G89.29   4. Somatic dysfunction of thoracic region M99.02   5. Somatic dysfunction of lumbar region M99.03   6. Somatic dysfunction of cervical region M99.01    ================================================================= Chronic right-sided thoracic back pain Patient is a significant anterior chain dominance and has had chronic midthoracic and upper thoracic pain.  This does seem to be worse while she is at work.  We will have her begin on therapeutic exercises per AVS as well as performed osteopathic manipulation on her today.  ++++++++++++++++++++++++++++++++++++++++++++   PROCEDURE NOTE : OSTEOPATHIC MANIPULATION The decision today to treat with Osteopathic Manipulative Therapy (OMT) was based on physical exam findings. Verbal consent was obtained after after explanation of risks, benefits and potential side effects, including acute pain flare, post manipulation soreness and need for repeat treatments.  Additional time was spent discussing the minimal risk of  injury to neurovascular structures for associated Cervical manipulation.  After verbal consent was obtained manipulation was performed as below:            Regions treated: Per examined regions as below and associated billing codes          Techniques used: Muscle Energy, MFR, HVLA and ART The patient tolerated the treatment well and reported Improved symptoms following treatment today. Patient was given medications, exercises, stretches and lifestyle modifications per AVS and verbally.     OSTEOPATHIC/STRUCTURAL EXAM FINDINGS:    C2 through C4 rotated right  T1  ERS left  T2 through T4 neutral rotated  right  Posterior rib 6  L3 FRS right     No notes on file ================================================================= Patient Instructions  Also check out [employer]" which is a program developed by Dr. Minerva Ends.   There are links to a couple of his YouTube Videos below and I would like to see performing one of his videos 5-6 days per week.    A good intro video is: "Independence from Pain 7-minute Video" - [email or web address]   His more advanced video is: "Powerful Posture and Pain Relief: 12 minutes of Foundation Training" - https://youtu.[email or web address]  Do not try to attempt this entire video when first beginning.    Try breaking of each exercise that he goes into shorter segments.  Otherwise if they perform an exercise for 45 seconds, start with 15 seconds and rest and then resume when they begin the new activity.    If you work your way up to doing this 12 minute video, I expect you  will see significant improvements in your pain.  If you enjoy his videos and would like to find out more you can look on his website: [email or web address]  He has a workout streaming option as well as a DVD set available for purchase.  Amazon has the best price for his DVDs.      ================================================================= Future Appointments  Date Time Provider [place]  01/13/2017  3:00 PM [name], DO [facility] None    Follow-up: Return in about 4 weeks (around 11/17/2016).   CMA/ATC served as Education administrator during this visit. History, Physical, and Plan performed by medical provider. Documentation and orders reviewed and attested to.      Teresa Coombs, [place] Sports Medicine Physician

## 2016-11-18 ENCOUNTER — Ambulatory Visit (INDEPENDENT_AMBULATORY_CARE_PROVIDER_SITE_OTHER): Payer: 59 | Admitting: Sports Medicine

## 2016-11-18 ENCOUNTER — Encounter: Payer: Self-pay | Admitting: Sports Medicine

## 2016-11-18 VITALS — BP 110/62 | HR 64 | Ht 66.0 in | Wt 95.2 lb

## 2016-11-18 DIAGNOSIS — M9901 Segmental and somatic dysfunction of cervical region: Secondary | ICD-10-CM

## 2016-11-18 DIAGNOSIS — M9904 Segmental and somatic dysfunction of sacral region: Secondary | ICD-10-CM

## 2016-11-18 DIAGNOSIS — M542 Cervicalgia: Secondary | ICD-10-CM | POA: Diagnosis not present

## 2016-11-18 DIAGNOSIS — M9905 Segmental and somatic dysfunction of pelvic region: Secondary | ICD-10-CM

## 2016-11-18 DIAGNOSIS — G8929 Other chronic pain: Secondary | ICD-10-CM

## 2016-11-18 DIAGNOSIS — M546 Pain in thoracic spine: Secondary | ICD-10-CM | POA: Diagnosis not present

## 2016-11-18 DIAGNOSIS — M9903 Segmental and somatic dysfunction of lumbar region: Secondary | ICD-10-CM

## 2016-11-18 DIAGNOSIS — M9902 Segmental and somatic dysfunction of thoracic region: Secondary | ICD-10-CM

## 2016-11-18 NOTE — Progress Notes (Signed)
OFFICE VISIT NOTE Marilyn Collins. Marilyn Collins, [place] at [facility] [phone]  Marilyn Collins - 61 y.o. female MRN [id number]  Date of birth: 11/05/1955  Visit Date: 11/18/2016  PCP: Marilyn Coke, PA   Referred by: Marilyn Collins, [place]  Marilyn Collins PT, LAT, ATC acting as scribe for Dr. Paulla Fore.  SUBJECTIVE:   Chief Complaint  Patient presents with  . Follow-up    R sidede T-spine pain   HPI: As below and per problem based documentation when appropriate.  Ms. Marilyn Collins is an established pt presenting for f/u of her R-sided t-spine pain.  She was last seen on 10/20/16.  She had been doing her [employer] exercises but didn't notice much change in her symptoms previously so she has not been doing those most recently.  She has also tried dry needling.  Pt states that she feels a little better than the last time she was here. She states that she has been using the [place] and notes some improvement w/ that.  However, she feels that the thing that helps her the most is the osteopathic manipulation.  She notes that she prefers this form of manual therapy over the chiropractor that she had been seeing in [place] (Dr. Owens Shark).      Review of Systems  Constitutional: Negative for chills, fever and weight loss.  HENT: Negative.   Eyes: Negative.   Respiratory: Negative for cough, shortness of breath and wheezing.   Cardiovascular: Positive for palpitations. Negative for chest pain.  Gastrointestinal: Positive for heartburn. Negative for abdominal pain and nausea.  Genitourinary: Negative.   Musculoskeletal: Positive for back pain and joint pain. Negative for falls.  Neurological: Negative for dizziness, tingling and headaches.  Endo/Heme/Allergies: Does not bruise/bleed easily.  Psychiatric/Behavioral: Negative for depression. The patient is nervous/anxious. The patient does not have insomnia.     Otherwise per HPI.  HISTORY & PERTINENT PRIOR DATA:  No  specialty comments available. She reports that she has never smoked. She has never used smokeless tobacco. No results for input(s): HGBA1C, LABURIC in the last 8760 hours. Allergies reviewed per EMR Prior to Admission medications   Medication Sig Start Date End Date Taking? Authorizing Provider  bifidobacterium infantis (ALIGN) capsule Take 1 capsule by mouth daily.   Yes [provider]  clonazePAM (KLONOPIN) 0.5 MG tablet Take 0.25 mg by mouth at bedtime.   Yes [provider]  dexlansoprazole (DEXILANT) 60 MG capsule Take 60 mg by mouth daily.   Yes [provider]  Diclofenac Sodium (PENNSAID) 2 % SOLN Place 1 application onto the skin 2 (two) times daily. 10/20/16  Yes [name], DO  diflunisal (DOLOBID) 500 MG TABS Take 500 mg by mouth as needed.    Yes [provider]  docusate sodium (COLACE) 100 MG capsule Take 100 mg by mouth daily.   Yes [provider]  estradiol (VIVELLE-DOT) 0.05 MG/24HR Place 1 patch onto the skin 2 (two) times a week.   Yes [provider]  levothyroxine (SYNTHROID, LEVOTHROID) 112 MCG tablet Take 112 mcg by mouth daily.  05/20/16  Yes [provider]  loratadine (CLARITIN) 10 MG tablet Take 10 mg by mouth daily.   Yes [provider]  loratadine (CLARITIN) 10 MG tablet Take 10 mg by mouth.   Yes [provider]  Methylcellulose, Laxative, (CITRUCEL PO) Take 1 scoop by mouth daily.   Yes [provider]  Multiple Vitamin (MULTIVITAMIN) tablet Take  1 tablet by mouth. Takes 1 tablet  M, W, F.   Yes [provider]   Patient Active Problem List   Diagnosis Date Noted  . Neck pain 11/20/2016  . Chronic right-sided thoracic back pain 08/03/2016  . Celiac disease 07/20/2011  . Iron deficiency anemia 07/20/2011  . Other iron deficiency anemia 07/20/2011  . Chronic idiopathic monocytosis 07/20/2011   Past Medical History:  Diagnosis Date  . Anxiety   .  Arthritis   . Celiac disease 07/20/2011  . Chronic idiopathic monocytosis 07/20/2011  . Dysphagia 12/2012   responsive to dexilant  . GERD (gastroesophageal reflux disease)   . H/O eating disorder   . Hypothyroidism   . Iron deficiency anemia, unspecified 07/20/2011  . Migraine headache   . Monocytosis (symptomatic) 07/20/2011  . Other specified intestinal malabsorption 07/20/2011   Family History  Problem Relation Age of Onset  . Cancer Mother   . Heart disease Mother   . Heart disease Father   . Hyperlipidemia Father   . Hypertension Father   . Stroke Father   . Hypertension Brother   . Cancer Maternal Grandmother   . Heart disease Paternal Grandmother   . Stroke Maternal Grandfather    Past Surgical History:  Procedure Laterality Date  . ABDOMINAL HYSTERECTOMY    . COLONOSCOPY  06/2003   neg  . DIAGNOSTIC LAPAROSCOPY    . ESOPHAGOGASTRODUODENOSCOPY N/A 10/20/2012   Procedure: ESOPHAGOGASTRODUODENOSCOPY (EGD);  Surgeon: Cleotis Nipper, MD;  Location: [facility];  Service: Endoscopy;  Laterality: N/A;  . FLEXIBLE SIGMOIDOSCOPY  2005  . SINUS IRRIGATION    . TMJ ARTHROPLASTY     Social History   Occupational History  . Not on file.   Social History Main Topics  . Smoking status: Never Smoker  . Smokeless tobacco: Never Used  . Alcohol use No  . Drug use: No  . Sexual activity: Yes    Birth control/ protection: Surgical    OBJECTIVE:  VS:  HT:5' 6"  (167.6 cm)   WT:95 lb 3.2 oz (43.2 kg)  BMI:15.37    BP:110/62  HR:64bpm  TEMP: ( )  RESP:99 % EXAM: Findings:  Adult female.  No acute distress.   Alert and appropriate.   Bilateral lower extremities overall well aligned.   She has good range of motion of bilateral hips with internal and external rotation but does have a functionally tight left hip flexor.   Hypermobility of the lumbar spine last for her to fully extend her hip but once again this is coming more from lumbar mechanics.   Lower extremity  sensation is intact.   Lower extremity myotomes 5+/5.  Trigger point at the thoracolumbar junction on the left.      RADIOLOGY: DG Outside Films Spine This examination belongs to an outside facility and is stored here for  comparison purposes only.  Contact the originating outside institution for  any associated report or interpretation.  ASSESSMENT & PLAN:     ICD-10-CM   1. Chronic right-sided thoracic back pain M54.6    G89.29   2. Segmental and somatic dysfunction of cervical region M99.01 OSTEOPATHIC MANIPULATION TREATMENT  3. Segmental and somatic dysfunction of thoracic region M99.02 OSTEOPATHIC MANIPULATION TREATMENT  4. Segmental and somatic dysfunction of lumbar region M99.03 OSTEOPATHIC MANIPULATION TREATMENT  5. Segmental and somatic dysfunction of sacral region M99.04 OSTEOPATHIC MANIPULATION TREATMENT  6. Segmental and somatic dysfunction of pelvic region M99.05 OSTEOPATHIC MANIPULATION TREATMENT  7. Neck pain M54.2    =================================================================  Neck pain Anterior chain dominance, osteopathic manipulation performed today with overall good improvement.  She reports this is been the most beneficial for her.  We can consider repeating this in the future.  Continue working on [employer] exercises and posterior chain recruitment exercises.  Chronic right-sided thoracic back pain See above  PROCEDURE NOTE : OSTEOPATHIC MANIPULATION The decision today to treat with Osteopathic Manipulative Therapy (OMT) was based on physical exam findings. Verbal consent was obtained after after explanation of risks, benefits and potential side effects, including acute pain flare, post manipulation soreness and need for repeat treatments.  Additional time was spent discussing the minimal risk of  injury to neurovascular structures for associated Cervical manipulation.  After verbal consent was obtained manipulation was performed as below:            Regions  treated: Per examined regions as below and associated billing codes          Techniques used: [employer], MFR, HVLA and ART The patient tolerated the treatment well and reported Improved symptoms following treatment today. Patient was given medications, exercises, stretches and lifestyle modifications per AVS and verbally.     OSTEOPATHIC/STRUCTURAL EXAM FINDINGS:    Left anterior innominate  Right on right sacral torsion  Markedly tight hip flexors  Anterior chain dominance  C2 FRS left  C4 through C6 flexed, rotated right  C7 extended rotated left  T2 through T4 NRleft, Sidebent Right  Posterior rib 6 on the left  L3 FRS right  =================================================================  Follow-up: Return in about 4 weeks (around 12/16/2016).   CMA/ATC served as Education administrator during this visit. History, Physical, and Plan performed by medical provider. Documentation and orders reviewed and attested to.      Teresa Coombs, [place] Sports [facility] Physician

## 2016-11-20 DIAGNOSIS — M542 Cervicalgia: Secondary | ICD-10-CM | POA: Insufficient documentation

## 2016-11-20 MED FILL — clonazePAM 1 MG TABS: 1 | 8 days supply | Qty: 15 | Fill #1

## 2016-11-20 MED FILL — LEVOTHYROXINE 112 MCG TAB: 112 | 90 days supply | Qty: 90 | Fill #1

## 2016-11-20 NOTE — Procedures (Signed)
PROCEDURE NOTE : OSTEOPATHIC MANIPULATION The decision today to treat with Osteopathic Manipulative Therapy (OMT) was based on physical exam findings. Verbal consent was obtained after after explanation of risks, benefits and potential side effects, including acute pain flare, post manipulation soreness and need for repeat treatments.  Additional time was spent discussing the minimal risk of  injury to neurovascular structures for associated Cervical manipulation.  After verbal consent was obtained manipulation was performed as below:            Regions treated: Per examined regions as below and associated billing codes          Techniques used: [employer], MFR, HVLA and ART The patient tolerated the treatment well and reported Improved symptoms following treatment today. Patient was given medications, exercises, stretches and lifestyle modifications per AVS and verbally.     OSTEOPATHIC/STRUCTURAL EXAM FINDINGS:    Left anterior innominate  Right on right sacral torsion  Markedly tight hip flexors  Anterior chain dominance  C2 FRS left  C4 through C6 flexed, rotated right  C7 extended rotated left  T2 through T4 NRleft, Sidebent Right  Posterior rib 6 on the left  L3 FRS right

## 2016-11-20 NOTE — Assessment & Plan Note (Signed)
Anterior chain dominance, osteopathic manipulation performed today with overall good improvement.  She reports this is been the most beneficial for her.  We can consider repeating this in the future.  Continue working on [employer] exercises and posterior chain recruitment exercises.

## 2016-11-20 NOTE — Assessment & Plan Note (Signed)
See above

## 2016-11-24 DIAGNOSIS — H524 Presbyopia: Secondary | ICD-10-CM | POA: Diagnosis not present

## 2016-11-24 DIAGNOSIS — H5213 Myopia, bilateral: Secondary | ICD-10-CM | POA: Diagnosis not present

## 2016-11-24 DIAGNOSIS — H52223 Regular astigmatism, bilateral: Secondary | ICD-10-CM | POA: Diagnosis not present

## 2016-11-30 MED FILL — XIIDRA 5% EYE DROPS: 5 | 30 days supply | Qty: 60 | Fill #0

## 2016-12-16 ENCOUNTER — Ambulatory Visit: Payer: 59 | Admitting: Sports Medicine

## 2016-12-16 ENCOUNTER — Encounter: Payer: Self-pay | Admitting: Sports Medicine

## 2016-12-16 VITALS — BP 108/70 | HR 62 | Ht 66.0 in | Wt 99.6 lb

## 2016-12-16 DIAGNOSIS — G8929 Other chronic pain: Secondary | ICD-10-CM

## 2016-12-16 DIAGNOSIS — M546 Pain in thoracic spine: Secondary | ICD-10-CM | POA: Diagnosis not present

## 2016-12-16 DIAGNOSIS — M542 Cervicalgia: Secondary | ICD-10-CM

## 2016-12-16 MED ORDER — TIZANIDINE HCL 4 MG PO CAPS: 4.0000 mg | ORAL_CAPSULE | Freq: Every day | ORAL | 1 refills | Status: DC

## 2016-12-16 MED FILL — tiZANidine HCL 4 MG TABS: 4 | 30 days supply | Qty: 30 | Fill #0

## 2016-12-16 MED FILL — DEXILANT DR 60 MG CAPSULE: 60 | 90 days supply | Qty: 90 | Fill #1

## 2016-12-16 NOTE — Patient Instructions (Signed)

## 2016-12-16 NOTE — Assessment & Plan Note (Signed)
Multiple trigger points  appreciated today the thoracolumbar junction. Trigger point injections performed as below.  ++++++++++++++++++++++++++++++++++++++++++++ PROCEDURE NOTE: TRIGGER POINTINJECTIONS  DESCRIPTION OF PROCEDURE:  The patient's clinical condition is marked by substantial pain and/or significant functional disability. Other conservative therapy has not provided relief, is contraindicated, or not appropriate. There is a reasonable likelihood that injection will significantly improve the patient's pain and/or functional impairment. After discussing the risks, benefits and expected outcomes of the injection and all questions were reviewed and answered, the patient wished to undergo the above named procedure. Verbal consent was obtained. The target structure was injected under direct visualization using sterile technique as below: PREP: Alcohol, Ethel Chloride APPROACH: Patient was injected in a prone position in the thoracolumbar musculature utilizing a pinch and inject technique under sterile conditions.  Patient tolerated this procedure well.  25-gauge 1.5 inch needle INJECTATE: 1cc 1% lidocaine, 1cc 0.5% marcaine, 1cc 33m DepoMedrol DRESSING: Band-Aid  Post procedural instructions including recommending icing and warning signs for infection were reviewed. This procedure was well tolerated and there were no complications.

## 2016-12-16 NOTE — Progress Notes (Signed)
OFFICE VISIT NOTE Marilyn Collins. Marilyn Collins, [place] at [facility] [phone]  Marilyn Collins - 61 y.o. female MRN [id number]  Date of birth: 09-13-1955  Visit Date: 12/16/2016  PCP: Marilyn Coke, PA   Referred by: Marilyn Coke, PA  [place], [place] acting as scribe for Dr. Paulla Fore.  SUBJECTIVE:   Chief Complaint  Patient presents with  . Follow-up    RT-sided thoracic back pain   HPI: As below and per problem based documentation when appropriate.  Marilyn Collins is an established patient presenting today in follow-up of RT-sided thoracic back pain. She was last seen 11/18/16 and received OMT. She was previously referred to PT and provided with the link for [facility] exercises.   Pt reports continued thoracic back pain since her last visit. She didn't get as much relief from OMT as she did the first visit. She has been exercising at home and she has done the [place] exercises.     Review of Systems  Constitutional: Positive for malaise/fatigue. Negative for chills and fever.  Respiratory: Negative for shortness of breath and wheezing.   Cardiovascular: Negative for chest pain and palpitations.  Musculoskeletal: Positive for back pain.  Neurological: Positive for weakness. Negative for dizziness, tingling and headaches.    Otherwise per HPI.  HISTORY & PERTINENT PRIOR DATA:  No specialty comments available. She reports that  has never smoked. she has never used smokeless tobacco. No results for input(s): HGBA1C, LABURIC, CREATINE in the last 8760 hours.  Invalid input(s): CR Allergies reviewed per EMR Prior to Admission medications   Medication Sig Start Date End Date Taking? Authorizing Provider  bifidobacterium infantis (ALIGN) capsule Take 1 capsule by mouth daily.   Yes [provider]  clonazePAM (KLONOPIN) 0.5 MG tablet Take 0.25 mg by mouth at bedtime.   Yes [provider]  dexlansoprazole (DEXILANT) 60 MG  capsule Take 60 mg by mouth daily.   Yes [provider]  Diclofenac Sodium (PENNSAID) 2 % SOLN Place 1 application onto the skin 2 (two) times daily. 10/20/16  Yes [name], DO  diflunisal (DOLOBID) 500 MG TABS Take 500 mg by mouth as needed.    Yes [provider]  docusate sodium (COLACE) 100 MG capsule Take 100 mg by mouth daily.   Yes [provider]  estradiol (VIVELLE-DOT) 0.05 MG/24HR Place 1 patch onto the skin 2 (two) times a week.   Yes [provider]  levothyroxine (SYNTHROID, LEVOTHROID) 112 MCG tablet Take 112 mcg by mouth daily.  05/20/16  Yes [provider]  loratadine (CLARITIN) 10 MG tablet Take 10 mg by mouth daily.   Yes [provider]  Methylcellulose, Laxative, (CITRUCEL PO) Take 1 scoop by mouth daily.   Yes [provider]  Multiple Vitamin (MULTIVITAMIN) tablet Take 1 tablet by mouth. Takes 1 tablet  M, W, F.   Yes [provider]  tiZANidine (ZANAFLEX) 4 MG capsule Take 1 capsule (4 mg total) at bedtime by mouth. 12/16/16   [name], DO   Patient Active Problem List   Diagnosis Date Noted  . Neck pain 11/20/2016  . Chronic right-sided thoracic back pain 08/03/2016  . Celiac disease 07/20/2011  . Iron deficiency anemia 07/20/2011  . Other iron deficiency anemia 07/20/2011  . Chronic idiopathic monocytosis 07/20/2011   Past Medical History:  Diagnosis Date  . Anxiety   . Arthritis   . Celiac disease 07/20/2011  . Chronic idiopathic  monocytosis 07/20/2011  . Dysphagia 12/2012   responsive to dexilant  . GERD (gastroesophageal reflux disease)   . H/O eating disorder   . Hypothyroidism   . Iron deficiency anemia, unspecified 07/20/2011  . Migraine headache   . Monocytosis (symptomatic) 07/20/2011  . Other specified intestinal malabsorption 07/20/2011   Family History  Problem Relation Age of Onset  . Cancer Mother   . Heart disease Mother   . Heart disease Father   .  Hyperlipidemia Father   . Hypertension Father   . Stroke Father   . Hypertension Brother   . Cancer Maternal Grandmother   . Heart disease Paternal Grandmother   . Stroke Maternal Grandfather    Past Surgical History:  Procedure Laterality Date  . ABDOMINAL HYSTERECTOMY    . COLONOSCOPY  06/2003   neg  . DIAGNOSTIC LAPAROSCOPY    . FLEXIBLE SIGMOIDOSCOPY  2005  . SINUS IRRIGATION    . TMJ ARTHROPLASTY     Social History   Occupational History  . Not on file  Tobacco Use  . Smoking status: Never Smoker  . Smokeless tobacco: Never Used  Substance and Sexual Activity  . Alcohol use: No    Alcohol/week: 0.0 oz  . Drug use: No  . Sexual activity: Yes    Birth control/protection: Surgical    OBJECTIVE:  VS:  HT:5' 6"  (167.6 cm)   WT:99 lb 9.6 oz (45.2 kg)  BMI:16.08    BP:108/70  HR:62bpm  TEMP: ( )  RESP:98 %  Wt Readings from Last 3 Encounters:  12/16/16 99 lb 9.6 oz (45.2 kg)  11/18/16 95 lb 3.2 oz (43.2 kg)  10/20/16 94 lb 12.8 oz (43 kg)   EXAM: Findings:  Thoracolumbar paraspinal muscle spasms with multiple trigger points appreciated at the thoracolumbar junction.  She does have a slight rotation of the upper thoracic spine.  Osteopathic manipulation was deferred today.    RADIOLOGY:  ASSESSMENT & PLAN:     ICD-10-CM   1. Neck pain M54.2   2. Chronic right-sided thoracic back pain M54.6    G89.29    ================================================================= Chronic right-sided thoracic back pain Multiple trigger points  appreciated today the thoracolumbar junction. Trigger point injections performed as below.  ++++++++++++++++++++++++++++++++++++++++++++ PROCEDURE NOTE: TRIGGER POINTINJECTIONS  DESCRIPTION OF PROCEDURE:  The patient's clinical condition is marked by substantial pain and/or significant functional disability. Other conservative therapy has not provided relief, is contraindicated, or not appropriate. There is a reasonable  likelihood that injection will significantly improve the patient's pain and/or functional impairment. After discussing the risks, benefits and expected outcomes of the injection and all questions were reviewed and answered, the patient wished to undergo the above named procedure. Verbal consent was obtained. The target structure was injected under direct visualization using sterile technique as below: PREP: Alcohol, Ethel Chloride APPROACH: Patient was injected in a prone position in the thoracolumbar musculature utilizing a pinch and inject technique under sterile conditions.  Patient tolerated this procedure well.  25-gauge 1.5 inch needle INJECTATE: 1cc 1% lidocaine, 1cc 0.5% marcaine, 1cc 4m DepoMedrol DRESSING: Band-Aid  Post procedural instructions including recommending icing and warning signs for infection were reviewed. This procedure was well tolerated and there were no complications.      No notes on file =================================================================   Follow-up: Return in about 4 weeks (around 01/13/2017).   CMA/ATC served as sEducation administratorduring this visit. History, Physical, and Plan performed by medical provider. Documentation and orders reviewed and attested to.  Teresa Coombs, [place] Sports Medicine Physician

## 2016-12-20 ENCOUNTER — Encounter: Payer: Self-pay | Admitting: Sports Medicine

## 2016-12-20 NOTE — Assessment & Plan Note (Signed)
Patient is a significant anterior chain dominance and has had chronic midthoracic and upper thoracic pain.  This does seem to be worse while she is at work.  We will have her begin on therapeutic exercises per AVS as well as performed osteopathic manipulation on her today.  ++++++++++++++++++++++++++++++++++++++++++++   PROCEDURE NOTE : OSTEOPATHIC MANIPULATION The decision today to treat with Osteopathic Manipulative Therapy (OMT) was based on physical exam findings. Verbal consent was obtained after after explanation of risks, benefits and potential side effects, including acute pain flare, post manipulation soreness and need for repeat treatments.  Additional time was spent discussing the minimal risk of  injury to neurovascular structures for associated Cervical manipulation.  After verbal consent was obtained manipulation was performed as below:            Regions treated: Per examined regions as below and associated billing codes          Techniques used: [employer], MFR, HVLA and ART The patient tolerated the treatment well and reported Improved symptoms following treatment today. Patient was given medications, exercises, stretches and lifestyle modifications per AVS and verbally.     OSTEOPATHIC/STRUCTURAL EXAM FINDINGS:    C2 through C4 rotated right  T1  ERS left  T2 through T4 neutral rotated right  Posterior rib 6  L3 FRS right

## 2016-12-25 MED FILL — SUMATRIPTAN SUCC 50 MG TABL: 50 | 90 days supply | Qty: 27 | Fill #1

## 2016-12-25 MED FILL — XIIDRA 5% EYE DROPS: 5 | 30 days supply | Qty: 60 | Fill #1

## 2016-12-30 DIAGNOSIS — M9902 Segmental and somatic dysfunction of thoracic region: Secondary | ICD-10-CM | POA: Diagnosis not present

## 2016-12-31 DIAGNOSIS — Z131 Encounter for screening for diabetes mellitus: Secondary | ICD-10-CM | POA: Diagnosis not present

## 2016-12-31 DIAGNOSIS — H6123 Impacted cerumen, bilateral: Secondary | ICD-10-CM | POA: Diagnosis not present

## 2016-12-31 DIAGNOSIS — Z0000 Encounter for general adult medical examination without abnormal findings: Secondary | ICD-10-CM | POA: Diagnosis not present

## 2016-12-31 DIAGNOSIS — Z13220 Encounter for screening for lipoid disorders: Secondary | ICD-10-CM | POA: Diagnosis not present

## 2016-12-31 DIAGNOSIS — H6120 Impacted cerumen, unspecified ear: Secondary | ICD-10-CM | POA: Diagnosis not present

## 2016-12-31 DIAGNOSIS — D509 Iron deficiency anemia, unspecified: Secondary | ICD-10-CM | POA: Diagnosis not present

## 2016-12-31 DIAGNOSIS — E039 Hypothyroidism, unspecified: Secondary | ICD-10-CM | POA: Diagnosis not present

## 2016-12-31 DIAGNOSIS — I700 Atherosclerosis of aorta: Secondary | ICD-10-CM | POA: Diagnosis not present

## 2016-12-31 MED FILL — clonazePAM 1 MG TABS: 1 | 7 days supply | Qty: 15 | Fill #0

## 2016-12-31 MED FILL — ESTRADIOL 0.1 MG PATCH: 0.1 | 84 days supply | Qty: 24 | Fill #0

## 2017-01-13 ENCOUNTER — Ambulatory Visit: Payer: 59 | Admitting: Sports Medicine

## 2017-01-13 ENCOUNTER — Encounter: Payer: Self-pay | Admitting: Sports Medicine

## 2017-01-13 VITALS — BP 100/60 | HR 69 | Ht 66.0 in | Wt 96.4 lb

## 2017-01-13 DIAGNOSIS — M9902 Segmental and somatic dysfunction of thoracic region: Secondary | ICD-10-CM

## 2017-01-13 DIAGNOSIS — M546 Pain in thoracic spine: Secondary | ICD-10-CM

## 2017-01-13 DIAGNOSIS — M9901 Segmental and somatic dysfunction of cervical region: Secondary | ICD-10-CM

## 2017-01-13 DIAGNOSIS — G8929 Other chronic pain: Secondary | ICD-10-CM | POA: Diagnosis not present

## 2017-01-13 DIAGNOSIS — M9908 Segmental and somatic dysfunction of rib cage: Secondary | ICD-10-CM

## 2017-01-13 MED ORDER — DULOXETINE HCL 20 MG PO CPEP: 20.0000 mg | ORAL_CAPSULE | Freq: Every day | ORAL | 3 refills | Status: DC

## 2017-01-13 MED FILL — DULoxetine HCL 20 MG CPEP: 20 | 30 days supply | Qty: 30 | Fill #0

## 2017-01-13 NOTE — Assessment & Plan Note (Addendum)
Multilevel degenerative changes of lower thoracic spine on x-rays as well as CT scan.  May reflect a true thoracic radiculitis. Osteopathic manipulation and chiropractic treatment have been somewhat beneficial.  Overhead physical activity seems to worsen the symptoms.  She is anterior chain dominant.  And would benefit from continued therapeutic exercises with posterior chain and core stabilization focus.  Osteopathic manipulation was performed today based on physical exam findings.  Please see procedure note for further information  We also discussed multiple options including potential for MRI of the thoracic spine to evaluate further for potential H&P with the option for epidural steroid injection if indicated We will start her on Cymbalta and see if this helps with any of the myofascial type pain that she is having which is a contributing factor but I do think there is an organic thoracic issue as well but no evidence of significant myelopathy or indications for neurosurgical evaluation/intervention.

## 2017-01-13 NOTE — Progress Notes (Signed)
Marilyn Collins. Marilyn Collins, [place] at [facility] [phone]  KENYATTE Collins - 61 y.o. female MRN [id number]  Date of birth: 05-Sep-1955   Scribe for today's visit: Josepha Pigg, CMA    SUBJECTIVE:  Marilyn Collins is here for Follow-up (neck and RT sided thoracic pain)  12/16/2016 summary: Marilyn Collins is an established patient presenting today in follow-up of RT-sided thoracic back pain. She was last seen 11/18/16 and received OMT. She was previously referred to PT and provided with the link for [facility] exercises.  Pt reports continued thoracic back pain since her last visit. She didn't get as much relief from OMT as she did the first visit. She has been exercising at home and she has done the [place] exercises.     Compared to the last office visit, her previously described symptoms show no change. She did get about 2 days of relief after injection but sx have returned. She has noticed that her sx improved slightly since cutting back on her weight training exercises.  Current symptoms are moderate on average, sometimes worse other times better & are radiating to the RT side She has not been doing home therapeutic exercises regularly though she does do her own exercises regularly. She taking tylenol prn and uses topical Diclofenac with some help. She also applies heat to her back with some relief. She also takes Zanaflex prn with some relief.    ROS Denies night time disturbances. Denies fevers, chills, or night sweats. Denies unexplained weight loss. Denies personal history of cancer. Denies changes in bowel or bladder habits. Denies recent unreported falls. Denies new or worsening dyspnea or wheezing. Denies headaches or dizziness.  Denies numbness, tingling or weakness  In the extremities.  Denies dizziness or presyncopal episodes Denies lower extremity edema     HISTORY & PERTINENT PRIOR DATA:  Prior History reviewed and updated per electronic  medical record.  Significant history, findings, studies and interim changes include:  reports that  has never smoked. she has never used smokeless tobacco. No results for input(s): HGBA1C, LABURIC, CREATINE in the last 8760 hours. No specialty comments available. Problem  Chronic Right-Sided Thoracic Back Pain   Multilevel degenerative changes of lower thoracic spine on x-rays as well as CT scan.  May reflect a true thoracic radiculitis. Osteopathic manipulation and chiropractic treatment have been somewhat beneficial.  Overhead physical activity seems to worsen the symptoms.  She is anterior chain dominant.     OBJECTIVE:  VS:  HT:5' 6"  (167.6 cm)   WT:96 lb 6.4 oz (43.7 kg)  BMI:15.57    BP:100/60  HR:69bpm  TEMP: ( )  RESP:99 %  PHYSICAL EXAM: Constitutional: WDWN, Non-toxic appearing. Psychiatric: Alert & appropriately interactive. Not depressed or anxious appearing. Respiratory: No increased work of breathing. Trachea Midline Eyes: Pupils are equal. EOM intact without nystagmus. No scleral icterus Cardiovascular:  Peripheral Pulses: peripheral pulses symmetrical No clubbing or cyanosis appreciated Capillary Refill is normal, less than 2 seconds No signficant generalized edema/anasarca Sensory Exam: intact to light touch  Overall anterior chain predominant postural position.  With anterior rotation of bilateral shoulders.  She has limited thoracic side bending with overall well-maintained rotation.  There are multiple trigger points within various spinal and periscapular musculature.  No reproducible radicular symptoms but she does have a generalized T8 and T9 radicular dysesthesia within the appropriate dermatomal distribution.   No additional findings.   ASSESSMENT & PLAN:   1. Chronic right-sided thoracic  back pain   2. Somatic dysfunction of cervical region   3. Somatic dysfunction of thoracic region   4. Somatic dysfunction of rib cage region    PLAN:    Chronic  right-sided thoracic back pain Multilevel degenerative changes of lower thoracic spine on x-rays as well as CT scan.  May reflect a true thoracic radiculitis. Osteopathic manipulation and chiropractic treatment have been somewhat beneficial.  Overhead physical activity seems to worsen the symptoms.  She is anterior chain dominant.  And would benefit from continued therapeutic exercises with posterior chain and core stabilization focus.  Osteopathic manipulation was performed today based on physical exam findings.  Please see procedure note for further information  We also discussed multiple options including potential for MRI of the thoracic spine to evaluate further for potential H&P with the option for epidural steroid injection if indicated We will start her on Cymbalta and see if this helps with any of the myofascial type pain that she is having which is a contributing factor but I do think there is an organic thoracic issue as well but no evidence of significant myelopathy or indications for neurosurgical evaluation/intervention.   ++++++++++++++++++++++++++++++++++++++++++++ Orders & Meds: Orders Placed This Encounter  Procedures  . OSTEOPATHIC MANIPULATION TREATMENT    Meds ordered this encounter  Medications  . DULoxetine (CYMBALTA) 20 MG capsule    Sig: Take 1 capsule (20 mg total) by mouth daily.    Dispense:  30 capsule    Refill:  3    ++++++++++++++++++++++++++++++++++++++++++++ Follow-up: Return in about 4 weeks (around 02/10/2017).   Pertinent documentation may be included in additional procedure notes, imaging studies, problem based documentation and patient instructions. Please see these sections of the encounter for additional information regarding this visit. CMA/ATC served as Education administrator during this visit. History, Physical, and Plan performed by medical provider. Documentation and orders reviewed and attested to.      [name], [facility]  Physician

## 2017-01-13 NOTE — Procedures (Signed)
PROCEDURE NOTE : OSTEOPATHIC MANIPULATION The decision today to treat with Osteopathic Manipulative Therapy (OMT) was based on physical exam findings. Verbal consent was obtained after after explanation of risks, benefits and potential side effects, including acute pain flare, post manipulation soreness and need for repeat treatments.      After verbal consent was obtained manipulation was performed as below:  Contraindications to OMT reviewed and include: NONE.             Regions treated: Per examined regions as below and associated billing codes          Techniques used: Muscle Energy, MFR and HVLA The patient tolerated the treatment well and reported Improved symptoms following treatment today. Patient was given medications, exercises, stretches and lifestyle modifications per AVS and verbally.     OSTEOPATHIC/STRUCTURAL EXAM FINDINGS:       C2 through C4 FRS left  C5 FRS right  T2 - T5 extended side bent right, rotated left  Ribs 6 posterior right  T8 FRS right

## 2017-01-15 ENCOUNTER — Telehealth: Payer: Self-pay | Admitting: Physician Assistant

## 2017-01-15 NOTE — Telephone Encounter (Signed)
Copied from [place]. Topic: Quick Communication - See Telephone Encounter >> Jan 15, 2017  3:24 PM Boyd Kerbs wrote: CRM for notification. See Telephone encounter for:   Patient called and said she would like to have the MRI done.  Please put order in.  01/15/17.

## 2017-01-18 ENCOUNTER — Other Ambulatory Visit: Payer: Self-pay

## 2017-01-18 DIAGNOSIS — M546 Pain in thoracic spine: Principal | ICD-10-CM

## 2017-01-18 DIAGNOSIS — G8929 Other chronic pain: Secondary | ICD-10-CM

## 2017-01-18 NOTE — Telephone Encounter (Signed)
Please place order for a thoracic spine MRI, rule out radiculitis.  Underlying degenerative changes, failed conservative measures.  Follow up after MRI is obtained to review these results.

## 2017-01-18 NOTE — Telephone Encounter (Signed)
Order placed. Called pt and advised. She will call back if she hasn't heard from [place] by next week.

## 2017-01-28 ENCOUNTER — Ambulatory Visit
Admission: RE | Admit: 2017-01-28 | Discharge: 2017-01-28 | Disposition: A | Discharge status: Home / Self Care | Payer: 59 | Source: Ambulatory Visit | Attending: Sports Medicine | Admitting: Sports Medicine

## 2017-01-28 DIAGNOSIS — M5114 Intervertebral disc disorders with radiculopathy, thoracic region: Secondary | ICD-10-CM | POA: Diagnosis not present

## 2017-01-28 DIAGNOSIS — M546 Pain in thoracic spine: Principal | ICD-10-CM

## 2017-01-28 DIAGNOSIS — G8929 Other chronic pain: Secondary | ICD-10-CM

## 2017-02-03 DIAGNOSIS — D692 Other nonthrombocytopenic purpura: Secondary | ICD-10-CM | POA: Diagnosis not present

## 2017-02-03 DIAGNOSIS — L819 Disorder of pigmentation, unspecified: Secondary | ICD-10-CM | POA: Diagnosis not present

## 2017-02-03 DIAGNOSIS — L814 Other melanin hyperpigmentation: Secondary | ICD-10-CM | POA: Diagnosis not present

## 2017-02-03 DIAGNOSIS — L821 Other seborrheic keratosis: Secondary | ICD-10-CM | POA: Diagnosis not present

## 2017-02-03 DIAGNOSIS — Z85828 Personal history of other malignant neoplasm of skin: Secondary | ICD-10-CM | POA: Diagnosis not present

## 2017-02-03 DIAGNOSIS — D1801 Hemangioma of skin and subcutaneous tissue: Secondary | ICD-10-CM | POA: Diagnosis not present

## 2017-02-03 DIAGNOSIS — D225 Melanocytic nevi of trunk: Secondary | ICD-10-CM | POA: Diagnosis not present

## 2017-02-03 DIAGNOSIS — D2372 Other benign neoplasm of skin of left lower limb, including hip: Secondary | ICD-10-CM | POA: Diagnosis not present

## 2017-02-03 NOTE — Progress Notes (Signed)
My chart message has been sent.  We will plan to follow-up with her as scheduled.  Later this week  Overall the MRI is reassuring that there is no significant nerve impingement however you do have some progression of degenerative disc disease since your last MRI in 2007. It does appear however the protrusions at the T7-8 level which correlates with your area of discomfort seem to be worse on the left than on the right.  We can discuss these results further at your next appointment but if you would like to consider getting set up to do an epidural directed at that level we can go ahead and place the referral.

## 2017-02-10 ENCOUNTER — Ambulatory Visit: Payer: 59 | Admitting: Sports Medicine

## 2017-02-23 MED FILL — LEVOTHYROXINE 112 MCG TAB: 112 | 90 days supply | Qty: 90 | Fill #0

## 2017-02-23 MED FILL — XIIDRA 5% EYE DROPS: 5 | 30 days supply | Qty: 60 | Fill #2

## 2017-03-08 MED FILL — DEXILANT DR 60 MG CAPSULE: 60 | 90 days supply | Qty: 90 | Fill #2

## 2017-03-16 ENCOUNTER — Ambulatory Visit: Payer: 59 | Admitting: Sports Medicine

## 2017-03-16 ENCOUNTER — Encounter: Payer: Self-pay | Admitting: Sports Medicine

## 2017-03-16 VITALS — BP 102/64 | HR 64 | Ht 66.0 in | Wt 97.0 lb

## 2017-03-16 DIAGNOSIS — M9905 Segmental and somatic dysfunction of pelvic region: Secondary | ICD-10-CM | POA: Diagnosis not present

## 2017-03-16 DIAGNOSIS — M4004 Postural kyphosis, thoracic region: Secondary | ICD-10-CM

## 2017-03-16 DIAGNOSIS — M9903 Segmental and somatic dysfunction of lumbar region: Secondary | ICD-10-CM | POA: Diagnosis not present

## 2017-03-16 DIAGNOSIS — S76312A Strain of muscle, fascia and tendon of the posterior muscle group at thigh level, left thigh, initial encounter: Secondary | ICD-10-CM | POA: Diagnosis not present

## 2017-03-16 DIAGNOSIS — G8929 Other chronic pain: Secondary | ICD-10-CM | POA: Diagnosis not present

## 2017-03-16 DIAGNOSIS — M9902 Segmental and somatic dysfunction of thoracic region: Secondary | ICD-10-CM

## 2017-03-16 DIAGNOSIS — M546 Pain in thoracic spine: Secondary | ICD-10-CM | POA: Diagnosis not present

## 2017-03-16 NOTE — Assessment & Plan Note (Signed)
Once again emphasized the importance of continuing to work on postural exercises.  While standing exercises reviewed with her today.

## 2017-03-16 NOTE — Assessment & Plan Note (Signed)
Continue avoidance activities and hopefully osteopathic manipulation performed today will be beneficial in helping her continue recovery.

## 2017-03-16 NOTE — Assessment & Plan Note (Signed)
She does have prominent kyphosis with slightly bulging disks that are present at T7-8 that are slightly worse than in the past.  I would like for her to undergo a targeted left-sided T7-8 epidural for both diagnostic and therapeutic purposes.  She has had multiple interventions and has only mild short-term relief of her pain with osteopathic manipulation.  She had essentially no relief from trigger point injections in this area.  Symptoms do tend to radiate from at times.  Could consider a facet versus medial branch block injection as well she does have some osteoarthritic changes of the facets.  Referral placed to Dr. Ernestina Patches for his expertise on this and we did discuss likely starting with an epidural but once again will defer to Dr. Romona Curls expertise on this.

## 2017-03-16 NOTE — Procedures (Signed)
PROCEDURE NOTE : OSTEOPATHIC MANIPULATION The decision today to treat with Osteopathic Manipulative Therapy (OMT) was based on physical exam findings. Verbal consent was obtained following a discussion with the patient regarding the of risks, benefits and potential side effects, including an acute pain flare,post manipulation soreness and need for repeat treatments.     Contraindications to OMT reviewed and include: NONE  Manipulation was performed as below: Regions Treated: lumbar spine, thoracic spine and pelvis Techniques used: HVLA, muscle energy, myofascial release and Articulatory The patient tolerated the treatment well and reported Improved symptoms following treatment today. Patient was given medications, exercises, stretches and lifestyle modifications per AVS and verbally.     OSTEOPATHIC/STRUCTURAL EXAM FINDINGS:    T4 through T6 neutral rotated left, side bent right  T8 FRS right  L3 FRS left  Left posterior innominate

## 2017-03-16 NOTE — Progress Notes (Signed)
Marilyn Collins. Marilyn Collins, [place] at [facility] [phone]  Marilyn Collins - 62 y.o. female MRN [id number]  Date of birth: Sep 28, 1955  Visit Date: 03/16/2017  PCP: [name], PA   Referred by: [name], [place]   Scribe for today's visit: Josepha Pigg, CMA     SUBJECTIVE:  Marilyn Collins is here for Follow-up (back pain)  03/16/16 summary: Compared to the last office visit, her previously described symptoms show no change. She did get about 2 days of relief after injection but sx have returned. She has noticed that her sx improved slightly since cutting back on her weight training exercises. Current symptoms are moderate on average, sometimes worse other times better & are radiating to the RT side She has not been doing home therapeutic exercises regularly though she does do her own exercises regularly. She taking tylenol prn and uses topical Diclofenac with some help. She also applies heat to her back with some relief. She also takes Zanaflex prn with some relief.     Compared to the last office visit, her previously described symptoms are improving. She continues to have RT-sided thoracic spine pain that radiates around; sx are less severe.  Current symptoms are moderate & are radiating to the RT ribs.  She has been taking Zanaflex prn with some relief. She never started on Cymbalta, would like to discuss epidural injection instead.   Pt also reports that she was stretching about 5 weeks ago and heard something in her leg give. She felt a grinding pain around the ischial tuberosity and sharp pains that radiate into the gluteal region and LT leg. At times she will have "pins and needles" in the felt foot.    ROS Reports occasional night time disturbances. Denies fevers, chills, or night sweats. Denies unexplained weight loss. Denies personal history of cancer. Denies changes in bowel or bladder habits. Denies recent unreported  falls. Denies new or worsening dyspnea or wheezing. Denies headaches or dizziness.  Reports numbness, tingling or weakness in both hands and LT foot.  Denies dizziness or presyncopal episodes Denies lower extremity edema     HISTORY & PERTINENT PRIOR DATA:  Prior History reviewed and updated per electronic medical record.  Significant history, findings, studies and interim changes include:  reports that  has never smoked. she has never used smokeless tobacco. No results for input(s): HGBA1C, LABURIC, CREATINE in the last 8760 hours. No specialty comments available. Problem  Left Hamstring Strain, Initial Encounter   Occurred at the new year.  She was stretching and lunged forward trying to grab something and felt the acute onset of pain.  She has had good steady improvement and is only having minimal symptoms today.   Postural Kyphosis of Thoracic Region  Chronic Right-Sided Thoracic Back Pain   Multilevel degenerative changes of lower thoracic spine on x-rays as well as CT scan.  May reflect a true thoracic radiculitis. Osteopathic manipulation and chiropractic treatment have been somewhat beneficial.  Overhead physical activity seems to worsen the symptoms.  She is anterior chain dominant.     OBJECTIVE:  VS:  HT:5' 6"  (167.6 cm)   WT:97 lb (44 kg)  BMI:15.66    BP:102/64  HR:64bpm  TEMP: ( )  RESP:99 %   PHYSICAL EXAM: Constitutional: WDWN, Non-toxic appearing. Psychiatric: Alert & appropriately interactive.  Not depressed or anxious appearing. Respiratory: No increased work of breathing.  Trachea Midline Eyes: Pupils are equal.  EOM intact without  nystagmus.  No scleral icterus  NEUROVASCULAR exam: No clubbing or cyanosis appreciated No significant venous stasis changes Capillary Refill: normal, less than 2 seconds   Bilateral lower extremities overall well aligned without significant deformity or lower extremity swelling.  She does have a small amount of tenderness  within the proximal hamstring and small palpable defect but this is minimal.  Her strength is good she does have a small amount of guarding with active knee flexion.  Pain is slightly worse at 90 degrees of knee flexion with resisted hamstring testing.  Mid back has a small amount of focal pain with tissue texture changes at T8 right greater than left.  Otherwise functional changes per osteopathic exam.    ASSESSMENT & PLAN:   1. Chronic bilateral thoracic back pain   2. Somatic dysfunction of thoracic region   3. Somatic dysfunction of lumbar region   4. Somatic dysfunction of pelvis region   5. Left hamstring strain, initial encounter   6. Postural kyphosis of thoracic region    PLAN:    Chronic right-sided thoracic back pain She does have prominent kyphosis with slightly bulging disks that are present at T7-8 that are slightly worse than in the past.  I would like for her to undergo a targeted left-sided T7-8 epidural for both diagnostic and therapeutic purposes.  She has had multiple interventions and has only mild short-term relief of her pain with osteopathic manipulation.  She had essentially no relief from trigger point injections in this area.  Symptoms do tend to radiate from at times.  Could consider a facet versus medial branch block injection as well she does have some osteoarthritic changes of the facets.  Referral placed to Dr. Ernestina Patches for his expertise on this and we did discuss likely starting with an epidural but once again will defer to Dr. Romona Curls expertise on this.  Left hamstring strain, initial encounter Continue avoidance activities and hopefully osteopathic manipulation performed today will be beneficial in helping her continue recovery.  Postural kyphosis of thoracic region Once again emphasized the importance of continuing to work on postural exercises.  While standing exercises reviewed with her today.   ++++++++++++++++++++++++++++++++++++++++++++ Orders &  Meds: Orders Placed This Encounter  Procedures  . Ambulatory referral to Physical Medicine Rehab    No orders of the defined types were placed in this encounter.   ++++++++++++++++++++++++++++++++++++++++++++ Follow-up: Return in about 8 weeks (around 05/11/2017).   Pertinent documentation may be included in additional procedure notes, imaging studies, problem based documentation and patient instructions. Please see these sections of the encounter for additional information regarding this visit. CMA/ATC served as Education administrator during this visit. History, Physical, and Plan performed by medical provider. Documentation and orders reviewed and attested to.      [name], [place] [facility] Physician

## 2017-04-01 ENCOUNTER — Encounter (INDEPENDENT_AMBULATORY_CARE_PROVIDER_SITE_OTHER): Payer: Self-pay | Admitting: Physical Medicine and Rehabilitation

## 2017-04-01 ENCOUNTER — Ambulatory Visit (INDEPENDENT_AMBULATORY_CARE_PROVIDER_SITE_OTHER): Payer: 59 | Admitting: Physical Medicine and Rehabilitation

## 2017-04-01 ENCOUNTER — Ambulatory Visit (INDEPENDENT_AMBULATORY_CARE_PROVIDER_SITE_OTHER): Payer: 59

## 2017-04-01 VITALS — BP 112/62 | HR 72 | Temp 98.2°F

## 2017-04-01 DIAGNOSIS — M5414 Radiculopathy, thoracic region: Secondary | ICD-10-CM | POA: Diagnosis not present

## 2017-04-01 DIAGNOSIS — M519 Unspecified thoracic, thoracolumbar and lumbosacral intervertebral disc disorder: Secondary | ICD-10-CM

## 2017-04-01 DIAGNOSIS — G8929 Other chronic pain: Secondary | ICD-10-CM | POA: Diagnosis not present

## 2017-04-01 DIAGNOSIS — M5416 Radiculopathy, lumbar region: Secondary | ICD-10-CM

## 2017-04-01 DIAGNOSIS — M546 Pain in thoracic spine: Secondary | ICD-10-CM

## 2017-04-01 MED ORDER — METHYLPREDNISOLONE ACETATE 80 MG/ML IJ SUSP
80.0000 mg | Freq: Once | INTRAMUSCULAR | Status: AC
  Administered 2017-04-01: 80 mg

## 2017-04-01 NOTE — Patient Instructions (Signed)

## 2017-04-01 NOTE — Progress Notes (Deleted)
Pt states a dull aching pain in middle back mostly on the right side. Pt states pain has been going on years and has gotten worse the last 6-8 months. Pt states lifting and sitting in the same position for awhile, nothing makes it better. +Driver, -BT, -Dye Allergies.

## 2017-04-06 NOTE — Procedures (Signed)
Thoracic epidural Steroid Injection - Interlaminar Approach with Fluoroscopic Guidance  Patient: Marilyn Collins      Date of Birth: 07-Dec-1955 MRN: [id number] PCP: [name], PA      Visit Date: 04/01/2017   Universal Protocol:     Consent Given By: the patient  Position: PRONE  Additional Comments: Vital signs were monitored before and after the procedure. Patient was prepped and draped in the usual sterile fashion. The correct patient, procedure, and site was verified.   Injection Procedure Details:  Procedure Site One Meds Administered:  Meds ordered this encounter  Medications  . methylPREDNISolone acetate (DEPO-MEDROL) injection 80 mg     Laterality: Right  Location/Site:  T7-8  Needle size: 20 G  Needle type: Tuohy  Needle Placement: Paramedian epidural  Findings:   -Comments: Excellent flow of contrast into the epidural space.  Procedure Details: Using a paramedian approach from the side mentioned above, the region overlying the inferior lamina was localized under fluoroscopic visualization and the soft tissues overlying this structure were infiltrated with 4 ml. of 1% Lidocaine without Epinephrine. The Tuohy needle was inserted into the epidural space using a paramedian approach.   The epidural space was localized using loss of resistance along with lateral and bi-planar fluoroscopic views.  After negative aspirate for air, blood, and CSF, a 2 ml. volume of Isovue-250 was injected into the epidural space and the flow of contrast was observed. Radiographs were obtained for documentation purposes.    The injectate was administered into the level noted above.   Additional Comments:  The patient tolerated the procedure well Dressing: Band-Aid    Post-procedure details: Patient was observed during the procedure. Post-procedure instructions were reviewed.  Patient left the clinic in stable condition.

## 2017-04-06 NOTE — Progress Notes (Signed)
Marilyn Collins - 62 y.o. female MRN [id number]  Date of birth: [birth year]  Office Visit Note: Visit Date: 04/01/2017 PCP: [name], PA Referred by: [name], [place]  Subjective: Chief Complaint  Patient presents with  . Middle Back - Pain   HPI: Marilyn Collins is a 62 year old femalearham who comes in  today at the request of Dr. Teresa Coombs for thoracic epidural injection diagnostically and hopefully therapeutically.  She is having pain in the middle of the back more on the right than the left side which is across the lower angle of the shoulder blades.  She reports chronic worsening symptoms for 6-8 months with failure to get better with conservative care but is had some relief with osteopathic manipulation.  She reports worsening with lifting and sitting in the same position for a while.  She has had ongoing thoracic pain for some years.  She is a very thin individual.  MRI evidence shows a very small disc protrusion at T6-7 which really is noncompressive and barely visible.  At T7-8 there is a right more than left paracentral protrusion but without focal compression or cord signal changes.  Interestingly there is probably more of a disc herniation at C6-7 but the axial images do not go that far to look at it.  Her pain referral pattern would seem to be a little bit lower than what that disc in the cervical spine would do which cannot rule that out at this point.  We are going to complete a right T7-8 interlaminar epidural steroid injection.  Consideration could be given to a transforaminal approach although the level of risk does increase with that approach.    ROS Otherwise per HPI.  Assessment & Plan: Visit Diagnoses:  1. Thoracic radiculopathy   2. Chronic right-sided thoracic back pain   3. Thoracic disc disease     Plan: Findings:  Right T7-8 interlaminar epidural steroid injection diagnostically and hopefully therapeutically.    Meds & Orders:  Meds ordered this encounter    Medications  . methylPREDNISolone acetate (DEPO-MEDROL) injection 80 mg    Orders Placed This Encounter  Procedures  . XR C-ARM NO REPORT  . Epidural Steroid injection    Follow-up: Return if symptoms worsen or fail to improve, for Dr. Paulla Fore.   Procedures: No procedures performed  Thoracic epidural Steroid Injection - Interlaminar Approach with Fluoroscopic Guidance  Patient: Marilyn Collins      Date of Birth: 14-Mar-1955 MRN: [id number] PCP: [name], PA      Visit Date: 04/01/2017   Universal Protocol:     Consent Given By: the patient  Position: PRONE  Additional Comments: Vital signs were monitored before and after the procedure. Patient was prepped and draped in the usual sterile fashion. The correct patient, procedure, and site was verified.   Injection Procedure Details:  Procedure Site One Meds Administered:  Meds ordered this encounter  Medications  . methylPREDNISolone acetate (DEPO-MEDROL) injection 80 mg     Laterality: Right  Location/Site:  T7-8  Needle size: 20 G  Needle type: Tuohy  Needle Placement: Paramedian epidural  Findings:   -Comments: Excellent flow of contrast into the epidural space.  Procedure Details: Using a paramedian approach from the side mentioned above, the region overlying the inferior lamina was localized under fluoroscopic visualization and the soft tissues overlying this structure were infiltrated with 4 ml. of 1% Lidocaine without Epinephrine. The Tuohy needle was inserted into the epidural space using a paramedian approach.  The epidural space was localized using loss of resistance along with lateral and bi-planar fluoroscopic views.  After negative aspirate for air, blood, and CSF, a 2 ml. volume of Isovue-250 was injected into the epidural space and the flow of contrast was observed. Radiographs were obtained for documentation purposes.    The injectate was administered into the level noted  above.   Additional Comments:  The patient tolerated the procedure well Dressing: Band-Aid    Post-procedure details: Patient was observed during the procedure. Post-procedure instructions were reviewed.  Patient left the clinic in stable condition.   Clinical History: MRI THORACIC SPINE WITHOUT CONTRAST  TECHNIQUE: Multiplanar, multisequence MR imaging of the thoracic spine was performed. No intravenous contrast was administered.  COMPARISON:  Thoracic spine MRI 05/04/2005.  FINDINGS: Limited cervical spine imaging: Partially visible C6-C7 disc space loss and disc herniation on series 4, image 6.  Thoracic spine segmentation: Appears normal. The same numbering system is used today as on the 2007 MRI.  Alignment: Stable thoracic vertebral height and alignment since 2007. Mild straightening of lower thoracic kyphosis.  Vertebrae: Several benign chronic vertebral body hemangiomas in the lower thoracic spine are stable since 2007, most notable at T11 and T12. Underlying bone marrow signal remains within normal limits. Mild chronic endplate irregularity inferiorly at T10, and at T11-T12 is stable since 2007. No marrow edema or evidence of acute osseous abnormality.  Cord: Spinal cord signal is within normal limits at all visualized levels. Capacious thoracic spinal canal. Conus medullaris occurs below the T12-L1 level and is not visible.  Paraspinal and other soft tissues: Negative visualized thoracic inlet and thoracic viscera. Negative visualized posterior paraspinal soft tissues.  Disc levels:  T1-T2: Negative.  T2-T3: Mild facet hypertrophy.  No stenosis.  T3-T4: Borderline to mild facet hypertrophy.  No stenosis.  T4-T5: Negative.  T5-T6: Negative.  T6-T7: Subtle central disc protrusion is new since 2007 but only faintly visible (series 4, image 7). No stenosis.  T7-T8: Small lobulated left greater than right paracentral  disc protrusions are new or increased since 2007, best seen on series 8, image 23. No associated stenosis.  T8-T9: Subtle if any left paracentral disc protrusion.  No stenosis.  T9-T10: Mild facet hypertrophy.  No stenosis.  T10-T11: Minimal disc bulge. Mild facet and ligament flavum hypertrophy. No stenosis.  T11-T12: Minimal disc bulge.  No stenosis.  T12-L1: Negative.  IMPRESSION: 1. Largely stable MRI appearance of the thoracic spine since 2007. No acute or interval osseous abnormality. 2. Mild for age thoracic spine degeneration and capacious underlying thoracic spinal canal. Subtle disc protrusions from T6-T7 to T8-T9 are new or have increased since 2007, but results in no spinal stenosis or neural impingement.   Electronically Signed   By: Genevie Ann M.D.   On: 01/28/2017 15:25  She reports that  has never smoked. she has never used smokeless tobacco. No results for input(s): HGBA1C, LABURIC in the last 8760 hours.  Objective:  VS:  HT:    WT:   BMI:     BP:112/62  HR:72bpm  TEMP:98.2 F (36.8 C)(Oral)  RESP:98 % Physical Exam  Musculoskeletal:  They stand and ambulate with a forward flexed lumbar spine.  There is low back pain with extension of the lumbar spine.  There is good distal strength.    Ortho Exam Imaging: No results found.  Past Medical/Family/Surgical/Social History: Medications & Allergies reviewed per EMR Patient Active Problem List   Diagnosis Date Noted  . Left hamstring strain, initial  encounter 03/16/2017  . Postural kyphosis of thoracic region 03/16/2017  . Neck pain 11/20/2016  . Chronic right-sided thoracic back pain 08/03/2016  . Celiac disease 07/20/2011  . Iron deficiency anemia 07/20/2011  . Other iron deficiency anemia 07/20/2011  . Chronic idiopathic monocytosis 07/20/2011   Past Medical History:  Diagnosis Date  . Anxiety   . Arthritis   . Celiac disease 07/20/2011  . Chronic idiopathic monocytosis 07/20/2011  .  Dysphagia 12/2012   responsive to dexilant  . GERD (gastroesophageal reflux disease)   . H/O eating disorder   . Hypothyroidism   . Iron deficiency anemia, unspecified 07/20/2011  . Migraine headache   . Monocytosis (symptomatic) 07/20/2011  . Other specified intestinal malabsorption 07/20/2011   Family History  Problem Relation Age of Onset  . Cancer Mother   . Heart disease Mother   . Heart disease Father   . Hyperlipidemia Father   . Hypertension Father   . Stroke Father   . Hypertension Brother   . Cancer Maternal Grandmother   . Heart disease Paternal Grandmother   . Stroke Maternal Grandfather    Past Surgical History:  Procedure Laterality Date  . ABDOMINAL HYSTERECTOMY    . COLONOSCOPY  06/2003   neg  . DIAGNOSTIC LAPAROSCOPY    . ESOPHAGOGASTRODUODENOSCOPY N/A 10/20/2012   Procedure: ESOPHAGOGASTRODUODENOSCOPY (EGD);  Surgeon: Cleotis Nipper, MD;  Location: [facility];  Service: Endoscopy;  Laterality: N/A;  . FLEXIBLE SIGMOIDOSCOPY  2005  . SINUS IRRIGATION    . TMJ ARTHROPLASTY     Social History   Occupational History  . Not on file  Tobacco Use  . Smoking status: Never Smoker  . Smokeless tobacco: Never Used  Substance and Sexual Activity  . Alcohol use: No    Alcohol/week: 0.0 oz  . Drug use: No  . Sexual activity: Yes    Birth control/protection: Surgical

## 2017-04-08 MED FILL — XIIDRA 5% EYE DROPS: 5 | 30 days supply | Qty: 60 | Fill #3

## 2017-04-08 MED FILL — tiZANidine HCL 4 MG TABS: 4 | 30 days supply | Qty: 30 | Fill #1

## 2017-04-27 ENCOUNTER — Other Ambulatory Visit: Payer: Self-pay | Admitting: *Deleted

## 2017-04-28 MED FILL — clonazePAM 1 MG TABS: 1 | 30 days supply | Qty: 15 | Fill #1

## 2017-05-11 ENCOUNTER — Ambulatory Visit: Payer: 59 | Admitting: Sports Medicine

## 2017-05-11 ENCOUNTER — Encounter: Payer: Self-pay | Admitting: Sports Medicine

## 2017-05-11 VITALS — BP 100/64 | HR 62 | Ht 66.0 in | Wt 97.6 lb

## 2017-05-11 DIAGNOSIS — M546 Pain in thoracic spine: Secondary | ICD-10-CM

## 2017-05-11 DIAGNOSIS — M9905 Segmental and somatic dysfunction of pelvic region: Secondary | ICD-10-CM

## 2017-05-11 DIAGNOSIS — M9908 Segmental and somatic dysfunction of rib cage: Secondary | ICD-10-CM

## 2017-05-11 DIAGNOSIS — G8929 Other chronic pain: Secondary | ICD-10-CM | POA: Diagnosis not present

## 2017-05-11 DIAGNOSIS — M9902 Segmental and somatic dysfunction of thoracic region: Secondary | ICD-10-CM | POA: Diagnosis not present

## 2017-05-11 NOTE — Progress Notes (Signed)
Marilyn Collins. Marilyn Collins, [name] [phone]  CRYSTINA Collins - 62 y.o. female MRN [id number]  Date of birth: 1955/11/13  Visit Date: 05/11/2017  PCP: [name], PA   Referred by: [name], [place]  Scribe for today's visit: [name], LAT, ATC     SUBJECTIVE:  Marilyn Collins is here for Follow-up (T-spine and L leg pain) .   03/16/16 summary: Compared to the last office visit, her previously described symptoms show no change. She did get about 2 days of relief after injection but sx have returned. She has noticed that her sx improved slightly since cutting back on her weight training exercises. Current symptoms are moderate on average, sometimes worse other times better & are radiating to the RT side She has not been doing home therapeutic exercises regularly though she does do her own exercises regularly. She taking tylenol prn and uses topical Diclofenac with some help. She also applies heat to her back with some relief. She also takes Zanaflex prn with some relief.     Compared to the last office visit, her previously described symptoms are improving. She continues to have RT-sided thoracic spine pain that radiates around; sx are less severe.  Current symptoms are moderate & are radiating to the RT ribs.  She has been taking Zanaflex prn with some relief. She never started on Cymbalta, would like to discuss epidural injection instead.   Pt also reports that she was stretching about 5 weeks ago and heard something in her leg give. She felt a grinding pain around the ischial tuberosity and sharp pains that radiate into the gluteal region and LT leg. At times she will have "pins and needles" in the felt foot.   05/11/17: Compared to the last office visit on 03/16/17, her previously described T-spine and L leg pain symptoms are improving with the symptoms being less intense. Current symptoms are mild-mod & are nonradiating She has  been taking Zanaflex.  She saw Dr. Ernestina Patches on 04/01/17 for a T7-8 epidural injection.  ROS Denies night time disturbances. Denies fevers, chills, or night sweats. Denies unexplained weight loss. Denies personal history of cancer. Denies changes in bowel or bladder habits. Denies recent unreported falls. Denies new or worsening dyspnea or wheezing. Denies headaches or dizziness.  Reports numbness, tingling or weakness  In the extremities - in B hands and feet. Denies dizziness or presyncopal episodes Denies lower extremity edema    HISTORY & PERTINENT PRIOR DATA:  Prior History reviewed and updated per electronic medical record.  Significant/pertinent history, findings, studies include:  reports that she has never smoked. She has never used smokeless tobacco. No results for input(s): HGBA1C, LABURIC, CREATINE in the last 8760 hours. No specialty comments available. No problems updated.  OBJECTIVE:  VS:  HT:5' 6"  (167.6 cm)   WT:97 lb 9.6 oz (44.3 kg)  BMI:15.76    BP:100/64  HR:62bpm  TEMP: ( )  RESP:98 %   PHYSICAL EXAM: Constitutional: WDWN, Non-toxic appearing. Psychiatric: Alert & appropriately interactive.  Not depressed or anxious appearing. Respiratory: No increased work of breathing.  Trachea Midline Eyes: Pupils are equal.  EOM intact without nystagmus.  No scleral icterus  Vascular Exam: warm to touch no edema  upper and lower extremity neuro exam: unremarkable  MSK Exam: Overall she is doing slightly better and has improved thoracic mobility at baseline.  Persistent osteopathic exam findings per procedure section.   ASSESSMENT & PLAN:  1. Somatic dysfunction of thoracic region   2. Chronic bilateral thoracic back pain   3. Somatic dysfunction of rib cage region   4. Somatic dysfunction of pelvis region     PLAN: She has done better following epidural.  Hopefully that combined with the aspect medication today we will provide her continued relief.  We  will follow-up with her as needed.  Follow-up: Return if symptoms worsen or fail to improve.    PROCEDURE NOTE : OSTEOPATHIC MANIPULATION The decision today to treat with Osteopathic Manipulative Therapy (OMT) was based on physical exam findings. Verbal consent was obtained following a discussion with the patient regarding the of risks, benefits and potential side effects, including an acute pain flare,post manipulation soreness and need for repeat treatments.     Contraindications to OMT reviewed and include: NONE  Manipulation was performed as below: Regions treated: Ribs, Thoracic spine and Pelvis OMT Techniques Used: HVLA, muscle energy and myofascial release  The patient tolerated the treatment well and reported Improved symptoms following treatment today. Patient was given medications, exercises, stretches and lifestyle modifications per AVS and verbally.   OSTEOPATHIC/STRUCTURAL EXAM:   T4 FRS right (Flexed, Rotated & Sidebent) T6 FRS right (Flexed, Rotated & Sidebent) T8 FRS left (Flexed, Rotated & Sidebent) Rib 7 Right  Posterior Right psoas spasm Right anterior innonimate      Please see additional documentation for Objective, Assessment and Plan sections. Pertinent additional documentation may be included in corresponding procedure notes, imaging studies, problem based documentation and patient instructions. Please see these sections of the encounter for additional information regarding this visit.  CMA/ATC served as Education administrator during this visit. History, Physical, and Plan performed by medical provider. Documentation and orders reviewed and attested to.      [name], [facility] Physician

## 2017-05-19 DIAGNOSIS — M25572 Pain in left ankle and joints of left foot: Secondary | ICD-10-CM | POA: Diagnosis not present

## 2017-05-19 DIAGNOSIS — E785 Hyperlipidemia, unspecified: Secondary | ICD-10-CM | POA: Diagnosis not present

## 2017-05-19 DIAGNOSIS — E039 Hypothyroidism, unspecified: Secondary | ICD-10-CM | POA: Diagnosis not present

## 2017-05-19 LAB — LIPID PANEL
Cholesterol: 174 (ref 0–200)
HDL: 66 (ref 35–70)
LDL Cholesterol: 98
Triglycerides: 51 (ref 40–160)

## 2017-05-23 ENCOUNTER — Telehealth: Payer: 59 | Admitting: Family

## 2017-05-23 DIAGNOSIS — B373 Candidiasis of vulva and vagina: Secondary | ICD-10-CM | POA: Diagnosis not present

## 2017-05-23 DIAGNOSIS — B3731 Acute candidiasis of vulva and vagina: Secondary | ICD-10-CM

## 2017-05-23 MED ORDER — FLUCONAZOLE 150 MG PO TABS: 150.0000 mg | ORAL_TABLET | Freq: Once | ORAL | 0 refills | Status: AC

## 2017-05-23 NOTE — Progress Notes (Signed)
Thank you for the details you included in the comment boxes. Those details are very helpful in determining the best course of treatment for you and help [place] to provide the best care. It certainly could be an indirect relation to the prednisone. On the other hand, it could be coincidence. Either way, we can solve the problem. I will send diflucan as below.  We are sorry that you are not feeling well. Here is how we plan to help! Based on what you shared with me it looks like you: May have a yeast vaginosis  Vaginosis is an inflammation of the vagina that can result in discharge, itching and pain. The cause is usually a change in the normal balance of vaginal bacteria or an infection. Vaginosis can also result from reduced estrogen levels after menopause.  The most common causes of vaginosis are:   Bacterial vaginosis which results from an overgrowth of one on several organisms that are normally present in your vagina.   Yeast infections which are caused by a naturally occurring fungus called candida.   Vaginal atrophy (atrophic vaginosis) which results from the thinning of the vagina from reduced estrogen levels after menopause.   Trichomoniasis which is caused by a parasite and is commonly transmitted by sexual intercourse.  Factors that increase your risk of developing vaginosis include: Marland Kitchen Medications, such as antibiotics and steroids . Uncontrolled diabetes . Use of hygiene products such as bubble bath, vaginal spray or vaginal deodorant . Douching . Wearing damp or tight-fitting clothing . Using an intrauterine device (IUD) for birth control . Hormonal changes, such as those associated with pregnancy, birth control pills or menopause . Sexual activity . Having a sexually transmitted infection  Your treatment plan is A single Diflucan (fluconazole) 141m tablet once.  I have electronically sent this prescription into the pharmacy that you have chosen.  Be sure to take all of the  medication as directed. Stop taking any medication if you develop a rash, tongue swelling or shortness of breath. Mothers who are breast feeding should consider pumping and discarding their breast milk while on these antibiotics. However, there is no consensus that infant exposure at these doses would be harmful.  Remember that medication creams can weaken latex condoms. .Marland Kitchen  HOME CARE:  Good hygiene may prevent some types of vaginosis from recurring and may relieve some symptoms:  . Avoid baths, hot tubs and whirlpool spas. Rinse soap from your outer genital area after a shower, and dry the area well to prevent irritation. Don't use scented or harsh soaps, such as those with deodorant or antibacterial action. .Marland KitchenAvoid irritants. These include scented tampons and pads. . Wipe from front to back after using the toilet. Doing so avoids spreading fecal bacteria to your vagina.  Other things that may help prevent vaginosis include:  .Marland KitchenDon't douche. Your vagina doesn't require cleansing other than normal bathing. Repetitive douching disrupts the normal organisms that reside in the vagina and can actually increase your risk of vaginal infection. Douching won't clear up a vaginal infection. . Use a latex condom. Both female and female latex condoms may help you avoid infections spread by sexual contact. . Wear cotton underwear. Also wear pantyhose with a cotton crotch. If you feel comfortable without it, skip wearing underwear to bed. Yeast thrives in mCampbell SoupYour symptoms should improve in the next day or two.  GET HELP RIGHT AWAY IF:  . You have pain in your lower abdomen ( pelvic area or  over your ovaries) . You develop nausea or vomiting . You develop a fever . Your discharge changes or worsens . You have persistent pain with intercourse . You develop shortness of breath, a rapid pulse, or you faint.  These symptoms could be signs of problems or infections that need to be evaluated  by a medical provider now.  MAKE SURE YOU    Understand these instructions.  Will watch your condition.  Will get help right away if you are not doing well or get worse.  Your e-visit answers were reviewed by a board certified advanced clinical practitioner to complete your personal care plan. Depending upon the condition, your plan could have included both over the counter or prescription medications. Please review your pharmacy choice to make sure that you have choses a pharmacy that is open for you to pick up any needed prescription, Your safety is important to [place]. If you have drug allergies check your prescription carefully.   You can use MyChart to ask questions about today's visit, request a non-urgent call back, or ask for a work or school excuse for 24 hours related to this e-Visit. If it has been greater than 24 hours you will need to follow up with your provider, or enter a new e-Visit to address those concerns. You will get a MyChart message within the next two days asking about your experience. I hope that your e-visit has been valuable and will speed your recovery.

## 2017-05-25 MED FILL — LEVOTHYROXINE 112 MCG TAB: 112 | 90 days supply | Qty: 90 | Fill #1 | Status: TO

## 2017-05-25 MED FILL — DEXILANT DR 60 MG CAPSULE: 60 | 90 days supply | Qty: 90 | Fill #3

## 2017-05-31 MED FILL — FLUCONAZOLE 150 MG TABS: 150 | 7 days supply | Qty: 7 | Fill #0

## 2017-06-10 ENCOUNTER — Encounter: Payer: Self-pay | Admitting: Sports Medicine

## 2017-06-24 MED FILL — clonazePAM 1 MG TABS: 1 | 30 days supply | Qty: 15 | Fill #2

## 2017-06-24 MED FILL — XIIDRA 5% EYE DROPS: 5 | 30 days supply | Qty: 60 | Fill #4

## 2017-07-27 DIAGNOSIS — E039 Hypothyroidism, unspecified: Secondary | ICD-10-CM | POA: Diagnosis not present

## 2017-07-27 DIAGNOSIS — E785 Hyperlipidemia, unspecified: Secondary | ICD-10-CM | POA: Diagnosis not present

## 2017-07-27 DIAGNOSIS — D509 Iron deficiency anemia, unspecified: Secondary | ICD-10-CM | POA: Diagnosis not present

## 2017-07-27 DIAGNOSIS — F419 Anxiety disorder, unspecified: Secondary | ICD-10-CM | POA: Diagnosis not present

## 2017-07-27 DIAGNOSIS — I700 Atherosclerosis of aorta: Secondary | ICD-10-CM | POA: Diagnosis not present

## 2017-07-27 DIAGNOSIS — R0789 Other chest pain: Secondary | ICD-10-CM | POA: Diagnosis not present

## 2017-07-27 LAB — IRON,TIBC AND FERRITIN PANEL
Ferritin: 55.6
Iron: 101

## 2017-07-27 LAB — TSH: TSH: 1.71 (ref <=5.90)

## 2017-07-27 LAB — HEPATIC FUNCTION PANEL
ALT: 14 (ref 7–35)
AST: 16 (ref 13–35)
Alkaline Phosphatase: 40 (ref 25–125)
Bilirubin, Total: 0.4

## 2017-07-27 LAB — CBC AND DIFFERENTIAL
HCT: 39 (ref 36–46)
Hemoglobin: 13 (ref 12.0–16.0)
Platelets: 199 (ref 150–399)
WBC: 5.1

## 2017-07-27 LAB — BASIC METABOLIC PANEL
BUN: 22 — AB (ref 4–21)
Creatinine: 0.7 (ref <=1.1)
Glucose: 73
Potassium: 4.7 (ref 3.4–5.3)
Sodium: 141 (ref 137–147)

## 2017-08-11 MED FILL — SUMAtriptan SUCCINATE 50 MG: 50 | 90 days supply | Qty: 54 | Fill #0

## 2017-08-11 MED FILL — LEVOTHYROXINE 112 MCG TAB: 112 | 90 days supply | Qty: 90 | Fill #0

## 2017-08-17 ENCOUNTER — Ambulatory Visit: Payer: 59 | Admitting: Cardiology

## 2017-09-07 ENCOUNTER — Encounter: Payer: Self-pay | Admitting: Sports Medicine

## 2017-09-07 ENCOUNTER — Ambulatory Visit: Payer: 59 | Admitting: Sports Medicine

## 2017-09-07 VITALS — BP 92/60 | HR 70 | Ht 66.0 in | Wt 95.6 lb

## 2017-09-07 DIAGNOSIS — M9902 Segmental and somatic dysfunction of thoracic region: Secondary | ICD-10-CM | POA: Diagnosis not present

## 2017-09-07 DIAGNOSIS — G8929 Other chronic pain: Secondary | ICD-10-CM

## 2017-09-07 DIAGNOSIS — M9905 Segmental and somatic dysfunction of pelvic region: Secondary | ICD-10-CM | POA: Diagnosis not present

## 2017-09-07 DIAGNOSIS — M9903 Segmental and somatic dysfunction of lumbar region: Secondary | ICD-10-CM | POA: Diagnosis not present

## 2017-09-07 DIAGNOSIS — M9901 Segmental and somatic dysfunction of cervical region: Secondary | ICD-10-CM | POA: Diagnosis not present

## 2017-09-07 DIAGNOSIS — M546 Pain in thoracic spine: Secondary | ICD-10-CM

## 2017-09-07 DIAGNOSIS — M9908 Segmental and somatic dysfunction of rib cage: Secondary | ICD-10-CM

## 2017-09-07 MED FILL — DEXILANT DR 60 MG CAPSULE: 60 | 90 days supply | Qty: 90 | Fill #0

## 2017-09-07 MED FILL — ESTRADIOL 0.1 MG PATCH: 0.1 | 84 days supply | Qty: 24 | Fill #1

## 2017-09-07 MED FILL — XIIDRA 5% EYE DROPS: 5 | 30 days supply | Qty: 60 | Fill #5

## 2017-09-07 NOTE — Progress Notes (Signed)
PROCEDURE NOTE : OSTEOPATHIC MANIPULATION The decision today to treat with Osteopathic Manipulative Therapy (OMT) was based on physical exam findings. Verbal consent was obtained following a discussion with the patient regarding the of risks, benefits and potential side effects, including an acute pain flare,post manipulation soreness and need for repeat treatments. Additionally, we specifically discussed the minimal risk of  injury to neurovascular structures associated with Cervical manipulation.   Contraindications to OMT: NONE  Manipulation was performed as below: Regions Treated OMT Techniques Used  Cervical spine Thoracic spine Ribs Lumbar spine Pelvis HVLA muscle energy myofascial release   The patient tolerated the treatment well and reported Improved symptoms following treatment today. Patient was given medications, exercises, stretches and lifestyle modifications per AVS and verbally.   OSTEOPATHIC/STRUCTURAL EXAM:   OA - rotated right T2 -6 Neutral, Rotated LEFT, Sidebent RIGHT T8 -10 Neutral, Rotated RIGHT, Sidebent LEFT Rib 7 Left  Posterior L3 FRS right (Flexed, Rotated & Sidebent) Right psoas spasm Right anterior innonimate

## 2017-09-07 NOTE — Progress Notes (Signed)
Marilyn Collins. Marilyn Collins, [place] at [facility] [phone]  Marilyn Collins - 62 y.o. female MRN [id number]  Date of birth: [birth year]  Visit Date: 09/07/2017  PCP: [name], PA   Referred by: [name], [place]  Scribe(s) for today's visit: Josepha Pigg, CMA  SUBJECTIVE:  Marilyn Collins is here for Follow-up (back pain)   03/16/16 summary: Compared to the last office visit, her previously described symptoms show no change. She did get about 2 days of relief after injection but sx have returned. She has noticed that her sx improved slightly since cutting back on her weight training exercises. Current symptoms are moderate on average, sometimes worse other times better & are radiating to the RT side She has not been doing home therapeutic exercises regularly though she does do her own exercises regularly. She taking tylenol prn and uses topical Diclofenac with some help. She also applies heat to her back with some relief. She also takes Zanaflex prn with some relief.  Compared to the last office visit, her previously described symptoms are improving. She continues to have RT-sided thoracic spine pain that radiates around; sx are less severe.  Current symptoms are moderate & are radiating to the RT ribs.  She has been taking Zanaflex prn with some relief. She never started on Cymbalta, would like to discuss epidural injection instead.  Pt also reports that she was stretching about 5 weeks ago and heard something in her leg give. She felt a grinding pain around the ischial tuberosity and sharp pains that radiate into the gluteal region and LT leg. At times she will have "pins and needles" in the left foot.   05/11/17: Compared to the last office visit on 03/16/17, her previously described T-spine and L leg pain symptoms are improving with the symptoms being less intense. Current symptoms are mild-mod & are nonradiating She has been taking Zanaflex.   She saw Dr. Ernestina Patches on 04/01/17 for a T7-8 epidural injection.  09/07/2017: Compared to the last office visit, her previously described symptoms are worsening. She did get some relief after epidural injection but she can feel pain "creeping back up". She is no longer having pins and needles in her L foot. She does have pain at night but it doesn't seem to wake her up or keep her up at night.  Current symptoms are mild & are radiating to the R ribs.  She has been having her husband massage her back and that seems to give her some relief.  She has been taking Zanaflex and Tylenol prn.    REVIEW OF SYSTEMS: Denies night time disturbances. Denies fevers, chills, or night sweats. Denies unexplained weight loss. Denies personal history of cancer. Denies changes in bowel or bladder habits. Denies recent unreported falls. Denies new or worsening dyspnea or wheezing. Reports headaches or dizziness.  Reports occasional numbness, tingling in the extremities.  Denies dizziness or presyncopal episodes Denies lower extremity edema    HISTORY:  Prior history reviewed and updated per electronic medical record.  Social History   Occupational History  . Not on file  Tobacco Use  . Smoking status: Never Smoker  . Smokeless tobacco: Never Used  Substance and Sexual Activity  . Alcohol use: No    Alcohol/week: 0.0 standard drinks  . Drug use: No  . 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 Marland Kitchen MRI Thoracic Spine:  o Mild for age thoracic spine degeneration and capacious underlying thoracic spinal canal. Subtle disc protrusions from T6-T7 to T8-T9 are new or have increased since 2007, but results in no spinal stenosis or neural impingement. . S/p Thoracic ESI ([facility]) 04/01/17 with good  improvement  OBJECTIVE:  VS:  HT:5' 6"  (167.6 cm)   WT:95 lb 9.6 oz (43.4 kg)  BMI:15.44    BP:92/60  HR:70bpm  TEMP: ( )  RESP:98 %   PHYSICAL EXAM: CONSTITUTIONAL: Well-developed, Well-nourished and In no acute distress PSYCHIATRIC: Alert & appropriately interactive. and Not depressed or anxious appearing. RESPIRATORY: No increased work of breathing and Trachea Midline EYES: Pupils are equal., EOM intact without nystagmus. and No scleral icterus.  VASCULAR EXAM: Warm and well perfused NEURO: unremarkable  MSK Exam: BACK:   Anterior chain dominance with mobile exaggerated thoracic kyphosis. No overlying skin changes. No focal bony tenderness TTP over Bilateral paraspinal muscles worse on the left than the right   She is able to heel and toe walk without difficulty.  Negative neural tension testing.  Cervical rotation is good within normal VBA test       ASSESSMENT   1. Chronic bilateral thoracic back pain   2. Somatic dysfunction of thoracic region   3. Somatic dysfunction of rib cage region   4. Somatic dysfunction of pelvis region   5. Somatic dysfunction of lumbar region   6. Somatic dysfunction of cervical region     PLAN:  Pertinent additional documentation may be included in corresponding procedure notes, imaging studies, problem based documentation and patient instructions.  Procedures:  . Osteopathic manipulation was performed today based on physical exam findings.  Please see procedure note for further information including Osteopathic Exam findings  Medications:  No orders of the defined types were placed in this encounter.  Discussion/Instructions: No problem-specific Assessment & Plan notes found for this encounter.  Marland Kitchen Responded well to epidural steroid injection February as well as with intermittent osteopathic manipulation. . Discussed the underlying features of tight hip flexors leading to crouched, fetal like position that results in spinal  column compression.  Including lumbar hyperflexion with hypermobility, thoracic flexion with restrictive rotation and cervical lordosis reversal  . Links to [employer] provided today per Patient Instructions.  These exercises were developed by Minerva Ends, DC with a strong emphasis on core neuromuscular reducation and postural realignment through body-weight exercises. . Continue previously prescribed home exercise program.  . Discussed red flag symptoms that warrant earlier emergent evaluation and patient voices understanding. . Activity modifications and the importance of avoiding exacerbating activities (limiting pain to no more than a 4 / 10 during or following activity) recommended and discussed.  Follow-up:  . Return if symptoms worsen or fail to improve, for consideration of repeat Osteopathic Manipulation.  . If any lack of improvement consider: . repeat corticosteroid injections . With Dr. Ernestina Patches     CMA/ATC served as scribe during this visit. History, Physical, and Plan performed by medical provider. Documentation and orders reviewed and attested to.      [name], [facility] Physician

## 2017-09-07 NOTE — Patient Instructions (Signed)

## 2017-09-14 NOTE — Progress Notes (Signed)
Referring-[facility] Reason for referral-Chest pain  HPI: 62 yo female for evaluation of chest pain at request of [facility].  Patient states that she has had intermittent chest pain for several years.  It is described as a tightness like a belt wrapped around her chest.  It is substernal without radiation.  Mild dyspnea but no nausea or diaphoresis.  It is not related to exertion, eating and is not pleuritic.  Can last 30 minutes and resolves spontaneously.  It has become more frequent and she requested further evaluation.  She does not have exertional chest pain nor does she have dyspnea on exertion, orthopnea, PND, pedal edema or history of syncope.  Current Outpatient Medications  Medication Sig Dispense Refill  . bifidobacterium infantis (ALIGN) capsule Take 1 capsule by mouth daily.    . clonazePAM (KLONOPIN) 0.5 MG tablet Take 0.25 mg by mouth at bedtime.    Marland Kitchen dexlansoprazole (DEXILANT) 60 MG capsule Take 60 mg by mouth daily.    . Diclofenac Sodium (PENNSAID) 2 % SOLN Place 1 application onto the skin 2 (two) times daily. 112 g 2  . docusate sodium (COLACE) 100 MG capsule Take 100 mg by mouth daily.    Marland Kitchen estradiol (VIVELLE-DOT) 0.05 MG/24HR Place 1 patch onto the skin 2 (two) times a week.    . levothyroxine (SYNTHROID, LEVOTHROID) 112 MCG tablet Take 112 mcg by mouth daily.     Marland Kitchen loratadine (CLARITIN) 10 MG tablet Take 5 mg by mouth daily.    . Methylcellulose, Laxative, (CITRUCEL PO) Take 1 scoop by mouth daily.    . Multiple Vitamin (MULTIVITAMIN) tablet Take 1 tablet by mouth. Takes 1 tablet  M, W, F.    . tiZANidine (ZANAFLEX) 4 MG capsule Take 2 mg by mouth 3 (three) times daily.    Marland Kitchen XIIDRA 5 % SOLN PLACE 1 DROP IN [facility] E[facility] TWICE A DAY  11   No current facility-administered medications for this visit.     Allergies  Allergen Reactions  . Morphine And Related Nausea And Vomiting  . Venofer [Ferric Oxide] Anaphylaxis     Past Medical History:    Diagnosis Date  . Anxiety   . Arthritis   . Celiac disease 07/20/2011  . Chronic idiopathic monocytosis 07/20/2011  . Dysphagia 12/2012   responsive to dexilant  . GERD (gastroesophageal reflux disease)   . H/O eating disorder   . Hypothyroidism   . Iron deficiency anemia, unspecified 07/20/2011  . Migraine headache   . Monocytosis (symptomatic) 07/20/2011  . Other specified intestinal malabsorption 07/20/2011    Past Surgical History:  Procedure Laterality Date  . ABDOMINAL HYSTERECTOMY    . COLONOSCOPY  06/2003   neg  . DIAGNOSTIC LAPAROSCOPY    . ESOPHAGOGASTRODUODENOSCOPY N/A 10/20/2012   Procedure: ESOPHAGOGASTRODUODENOSCOPY (EGD);  Surgeon: Cleotis Nipper, MD;  Location: [facility];  Service: Endoscopy;  Laterality: N/A;  . FLEXIBLE SIGMOIDOSCOPY  2005  . SINUS IRRIGATION    . TMJ ARTHROPLASTY      Social History   Socioeconomic History  . Marital status: Married    Spouse name: Not on file  . Number of children: 2  . Years of education: Not on file  . Highest education level: Not on file  Occupational History  . Not on file  Social Needs  . Financial resource strain: Not on file  . Food insecurity:    Worry: Not on file    Inability: Not on file  .  Transportation needs:    Medical: Not on file    Non-medical: Not on file  Tobacco Use  . Smoking status: Never Smoker  . Smokeless tobacco: Never Used  Substance and Sexual Activity  . Alcohol use: No    Alcohol/week: 0.0 standard drinks  . Drug use: No  . Sexual activity: Yes    Birth control/protection: Surgical  Lifestyle  . Physical activity:    Days per week: Not on file    Minutes per session: Not on file  . Stress: Not on file  Relationships  . Social connections:    Talks on phone: Not on file    Gets together: Not on file    Attends religious service: Not on file    Active member of club or organization: Not on file    Attends meetings of clubs or organizations: Not on file     Relationship status: Not on file  . Intimate partner violence:    Fear of current or ex partner: Not on file    Emotionally abused: Not on file    Physically abused: Not on file    Forced sexual activity: Not on file  Other Topics Concern  . 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 Cancer Mother   . Heart disease Mother   . Heart disease Father   . Hyperlipidemia Father   . Hypertension Father   . Stroke Father   . Hypertension Brother   . Cancer Maternal Grandmother   . Heart disease Paternal Grandmother   . Stroke Maternal Grandfather     ROS: no fevers or chills, productive cough, hemoptysis, dysphasia, odynophagia, melena, hematochezia, dysuria, hematuria, rash, seizure activity, orthopnea, PND, pedal edema, claudication. Remaining systems are negative.  Physical Exam:   Blood pressure (!) 99/58, pulse 61, height 5' 6"  (1.676 m), weight 96 lb 1.9 oz (43.6 kg).  General:  Well developed/well nourished in NAD Skin warm/dry Patient not depressed No peripheral clubbing Back-normal HEENT-normal/normal eyelids Neck supple/normal carotid upstroke bilaterally; no bruits; no JVD; no thyromegaly chest - CTA/ normal expansion CV - RRR/normal S1 and S2; no murmurs, rubs or gallops;  PMI nondisplaced Abdomen -NT/ND, no HSM, no mass, + bowel sounds, no bruit 2+ femoral pulses, no bruits Ext-no edema, chords, 2+ DP Neuro-grossly nonfocal  ECG -normal sinus rhythm at a rate of 61.  No ST changes.  Personally reviewed  A/P  1 chest pain-symptoms are atypical.  Electrocardiogram is normal.  I will arrange an exercise treadmill for risk stratification.  2 hypothyroid-continue Synthroid.  3 celiac disease-Per primary care.  Kirk Ruths, MD

## 2017-09-15 ENCOUNTER — Encounter: Payer: Self-pay | Admitting: Cardiology

## 2017-09-15 ENCOUNTER — Ambulatory Visit: Payer: 59 | Admitting: Cardiology

## 2017-09-15 VITALS — BP 99/58 | HR 61 | Ht 66.0 in | Wt 96.1 lb

## 2017-09-15 DIAGNOSIS — R072 Precordial pain: Secondary | ICD-10-CM | POA: Diagnosis not present

## 2017-09-15 NOTE — Patient Instructions (Signed)
Medication Instructions:   NO CHANGE  Testing/Procedures:  Your physician has requested that you have an exercise tolerance test. For further information please visit [email or web address]. Please also follow instruction sheet, as given.  Follow-Up:  Your physician recommends that you schedule a follow-up appointment in: AS NEEDED    Exercise Stress Electrocardiogram An exercise stress electrocardiogram is a test to check how blood flows to your heart. It is done to find areas of poor blood flow. You will need to walk on a treadmill for this test. The electrocardiogram will record your heartbeat when you are at rest and when you are exercising. What happens before the procedure?  Do not have drinks with caffeine or foods with caffeine for 24 hours before the test, or as told by your doctor. This includes coffee, tea (even decaf tea), sodas, chocolate, and cocoa.  Follow your doctor's instructions about eating and drinking before the test.  Ask your doctor what medicines you should or should not take before the test. Take your medicines with water unless told by your doctor not to.  If you use an inhaler, bring it with you to the test.  Bring a snack to eat after the test.  Do not  smoke for 4 hours before the test.  Do not put lotions, powders, creams, or oils on your chest before the test.  Wear comfortable shoes and clothing. What happens during the procedure?  You will have patches put on your chest. Small areas of your chest may need to be shaved. Wires will be connected to the patches.  Your heart rate will be watched while you are resting and while you are exercising.  You will walk on the treadmill. The treadmill will slowly get faster to raise your heart rate.  The test will take about 1-2 hours. What happens after the procedure?  Your heart rate and blood pressure will be watched after the test.  You may return to your normal diet, activities, and medicines or as  told by your doctor. This information is not intended to replace advice given to you by your health care provider. Make sure you discuss any questions you have with your health care provider. Document Released: 07/08/2007 Document Revised: 09/18/2015 Document Reviewed: 09/26/2012 Elsevier Interactive Patient Education  [employer].

## 2017-09-28 ENCOUNTER — Encounter (HOSPITAL_COMMUNITY): Payer: 59

## 2017-10-05 ENCOUNTER — Inpatient Hospital Stay (HOSPITAL_COMMUNITY): Admission: RE | Admit: 2017-10-05 | Payer: 59 | Source: Ambulatory Visit

## 2017-10-11 ENCOUNTER — Telehealth: Payer: Self-pay | Admitting: Sports Medicine

## 2017-10-11 NOTE — Telephone Encounter (Signed)
See note

## 2017-10-11 NOTE — Telephone Encounter (Signed)
Patient is requesting a call back in regards to Tizanidine, the patient stated she only takes this medication as needed. She is requesting a call back.  Please advise

## 2017-10-12 MED ORDER — TIZANIDINE HCL 4 MG PO CAPS: ORAL_CAPSULE | ORAL | 1 refills | Status: DC

## 2017-10-12 MED FILL — clonazePAM 1 MG TABS: 1 | 7 days supply | Qty: 15 | Fill #0

## 2017-10-12 MED FILL — tiZANidine HCL 4 MG TABS: 4 | 30 days supply | Qty: 30 | Fill #0

## 2017-10-12 NOTE — Telephone Encounter (Signed)
Rx has been refilled, pt aware.

## 2017-10-12 NOTE — Addendum Note (Signed)
Addended by: Jasper Loser on: 10/12/2017 01:46 PM   Modules accepted: Orders

## 2017-10-12 NOTE — Telephone Encounter (Signed)
Marilyn Collins, pt was taking this when she saw Dr. Paulla Fore last on 09/07/2017 visit. Will he refill for pt?

## 2017-10-14 ENCOUNTER — Telehealth (INDEPENDENT_AMBULATORY_CARE_PROVIDER_SITE_OTHER): Payer: Self-pay | Admitting: Physical Medicine and Rehabilitation

## 2017-10-14 NOTE — Telephone Encounter (Signed)
Yes ok, if same symptoms

## 2017-10-14 NOTE — Telephone Encounter (Signed)
Scheduled for 10/26/17 at 1530 with driver and no blood thinners.

## 2017-10-19 ENCOUNTER — Encounter: Payer: Self-pay | Admitting: Sports Medicine

## 2017-10-26 ENCOUNTER — Encounter (INDEPENDENT_AMBULATORY_CARE_PROVIDER_SITE_OTHER): Payer: 59 | Admitting: Physical Medicine and Rehabilitation

## 2017-11-04 ENCOUNTER — Ambulatory Visit (INDEPENDENT_AMBULATORY_CARE_PROVIDER_SITE_OTHER): Payer: Self-pay

## 2017-11-04 ENCOUNTER — Ambulatory Visit (INDEPENDENT_AMBULATORY_CARE_PROVIDER_SITE_OTHER): Payer: 59 | Admitting: Physical Medicine and Rehabilitation

## 2017-11-04 ENCOUNTER — Encounter (INDEPENDENT_AMBULATORY_CARE_PROVIDER_SITE_OTHER): Payer: Self-pay | Admitting: Physical Medicine and Rehabilitation

## 2017-11-04 VITALS — BP 107/60 | HR 60 | Temp 98.3°F

## 2017-11-04 DIAGNOSIS — M5414 Radiculopathy, thoracic region: Secondary | ICD-10-CM

## 2017-11-04 DIAGNOSIS — G8929 Other chronic pain: Secondary | ICD-10-CM

## 2017-11-04 DIAGNOSIS — M519 Unspecified thoracic, thoracolumbar and lumbosacral intervertebral disc disorder: Secondary | ICD-10-CM | POA: Diagnosis not present

## 2017-11-04 DIAGNOSIS — M546 Pain in thoracic spine: Secondary | ICD-10-CM | POA: Diagnosis not present

## 2017-11-04 MED ORDER — METHYLPREDNISOLONE ACETATE 80 MG/ML IJ SUSP
80.0000 mg | Freq: Once | INTRAMUSCULAR | Status: AC
  Administered 2017-11-04: 80 mg

## 2017-11-04 NOTE — Progress Notes (Signed)
 .  Numeric Pain Rating Scale and Functional Assessment Average Pain 6   In the last MONTH (on 0-10 scale) has pain interfered with the following?  1. General activity like being  able to carry out your everyday physical activities such as walking, climbing stairs, carrying groceries, or moving a chair?  Rating(3)   +Driver, -BT, -Dye Allergies.

## 2017-11-04 NOTE — Patient Instructions (Signed)

## 2017-11-09 MED FILL — XIIDRA 5% EYE DROPS: 5 | 30 days supply | Qty: 60 | Fill #6

## 2017-11-11 NOTE — Procedures (Signed)
Thoracic epidural Steroid Injection - Interlaminar Approach with Fluoroscopic Guidance  Patient: Marilyn Collins      Date of Birth: 1956-01-23 MRN: [id number] PCP: [name], PA      Visit Date: 11/04/2017   Universal Protocol:     Consent Given By: the patient  Position: PRONE  Additional Comments: Vital signs were monitored before and after the procedure. Patient was prepped and draped in the usual sterile fashion. The correct patient, procedure, and site was verified.   Injection Procedure Details:  Procedure Site One Meds Administered:  Meds ordered this encounter  Medications  . methylPREDNISolone acetate (DEPO-MEDROL) injection 80 mg     Laterality: Right  Location/Site:  T7-8  Needle size: 20 G  Needle type: Tuohy  Needle Placement: Paramedian epidural  Findings:   -Comments: Excellent flow of contrast into the epidural space.  Procedure Details: Using a paramedian approach from the side mentioned above, the region overlying the inferior lamina was localized under fluoroscopic visualization and the soft tissues overlying this structure were infiltrated with 4 ml. of 1% Lidocaine without Epinephrine. The Tuohy needle was inserted into the epidural space using a paramedian approach.   The epidural space was localized using loss of resistance along with lateral and bi-planar fluoroscopic views.  After negative aspirate for air, blood, and CSF, a 2 ml. volume of Isovue-250 was injected into the epidural space and the flow of contrast was observed. Radiographs were obtained for documentation purposes.    The injectate was administered into the level noted above.   Additional Comments:  The patient tolerated the procedure well Dressing: Band-Aid    Post-procedure details: Patient was observed during the procedure. Post-procedure instructions were reviewed.  Patient left the clinic in stable condition.

## 2017-11-11 NOTE — Progress Notes (Signed)
Marilyn Collins - 62 y.o. female MRN [id number]  Date of birth: [birth year]  Office Visit Note: Visit Date: 11/04/2017 PCP: [name], PA Referred by: [name], [place]  Subjective: Chief Complaint  Patient presents with  . Middle Back - Pain   HPI:  Marilyn Collins is a 62 y.o. female who comes in today for planned repeat of right T7-8 interlaminar epidural steroid injection.  She continues to follow with Teresa Coombs for her thoracic pain which is chronic.  Rates her pain on average is 6 out of 10 and does affect her daily activities.  Pain is mostly on the right side.  Injection did make her symptoms much better back in February.  Symptoms never fully went away that she was pretty happy with the amount of relief that she did get.  She does report some decreased symptoms if she sits for a while and uses a muscle relaxer.  ROS Otherwise per HPI.  Assessment & Plan: Visit Diagnoses:  1. Thoracic radiculopathy   2. Chronic right-sided thoracic back pain   3. Thoracic disc disease     Plan: No additional findings.   Meds & Orders:  Meds ordered this encounter  Medications  . methylPREDNISolone acetate (DEPO-MEDROL) injection 80 mg    Orders Placed This Encounter  Procedures  . XR C-ARM NO REPORT  . Epidural Steroid injection    Follow-up: Return if symptoms worsen or fail to improve.   Procedures: No procedures performed  Thoracic epidural Steroid Injection - Interlaminar Approach with Fluoroscopic Guidance  Patient: Marilyn Collins      Date of Birth: August 02, 1955 MRN: [id number] PCP: [name], PA      Visit Date: 11/04/2017   Universal Protocol:     Consent Given By: the patient  Position: PRONE  Additional Comments: Vital signs were monitored before and after the procedure. Patient was prepped and draped in the usual sterile fashion. The correct patient, procedure, and site was verified.   Injection Procedure Details:  Procedure Site One Meds  Administered:  Meds ordered this encounter  Medications  . methylPREDNISolone acetate (DEPO-MEDROL) injection 80 mg     Laterality: Right  Location/Site:  T7-8  Needle size: 20 G  Needle type: Tuohy  Needle Placement: Paramedian epidural  Findings:   -Comments: Excellent flow of contrast into the epidural space.  Procedure Details: Using a paramedian approach from the side mentioned above, the region overlying the inferior lamina was localized under fluoroscopic visualization and the soft tissues overlying this structure were infiltrated with 4 ml. of 1% Lidocaine without Epinephrine. The Tuohy needle was inserted into the epidural space using a paramedian approach.   The epidural space was localized using loss of resistance along with lateral and bi-planar fluoroscopic views.  After negative aspirate for air, blood, and CSF, a 2 ml. volume of Isovue-250 was injected into the epidural space and the flow of contrast was observed. Radiographs were obtained for documentation purposes.    The injectate was administered into the level noted above.   Additional Comments:  The patient tolerated the procedure well Dressing: Band-Aid    Post-procedure details: Patient was observed during the procedure. Post-procedure instructions were reviewed.  Patient left the clinic in stable condition.   Clinical History: MRI THORACIC SPINE WITHOUT CONTRAST  TECHNIQUE: Multiplanar, multisequence MR imaging of the thoracic spine was performed. No intravenous contrast was administered.  COMPARISON:  Thoracic spine MRI 05/04/2005.  FINDINGS: Limited cervical spine imaging: Partially visible C6-C7 disc  space loss and disc herniation on series 4, image 6.  Thoracic spine segmentation: Appears normal. The same numbering system is used today as on the 2007 MRI.  Alignment: Stable thoracic vertebral height and alignment since 2007. Mild straightening of lower thoracic  kyphosis.  Vertebrae: Several benign chronic vertebral body hemangiomas in the lower thoracic spine are stable since 2007, most notable at T11 and T12. Underlying bone marrow signal remains within normal limits. Mild chronic endplate irregularity inferiorly at T10, and at T11-T12 is stable since 2007. No marrow edema or evidence of acute osseous abnormality.  Cord: Spinal cord signal is within normal limits at all visualized levels. Capacious thoracic spinal canal. Conus medullaris occurs below the T12-L1 level and is not visible.  Paraspinal and other soft tissues: Negative visualized thoracic inlet and thoracic viscera. Negative visualized posterior paraspinal soft tissues.  Disc levels:  T1-T2: Negative.  T2-T3: Mild facet hypertrophy.  No stenosis.  T3-T4: Borderline to mild facet hypertrophy.  No stenosis.  T4-T5: Negative.  T5-T6: Negative.  T6-T7: Subtle central disc protrusion is new since 2007 but only faintly visible (series 4, image 7). No stenosis.  T7-T8: Small lobulated left greater than right paracentral disc protrusions are new or increased since 2007, best seen on series 8, image 23. No associated stenosis.  T8-T9: Subtle if any left paracentral disc protrusion.  No stenosis.  T9-T10: Mild facet hypertrophy.  No stenosis.  T10-T11: Minimal disc bulge. Mild facet and ligament flavum hypertrophy. No stenosis.  T11-T12: Minimal disc bulge.  No stenosis.  T12-L1: Negative.  IMPRESSION: 1. Largely stable MRI appearance of the thoracic spine since 2007. No acute or interval osseous abnormality. 2. Mild for age thoracic spine degeneration and capacious underlying thoracic spinal canal. Subtle disc protrusions from T6-T7 to T8-T9 are new or have increased since 2007, but results in no spinal stenosis or neural impingement.   Electronically Signed   By: Genevie Ann M.D.   On: 01/28/2017 15:25     Objective:  VS:  HT:    WT:   BMI:      BP:107/60  HR:60bpm  TEMP:98.3 F (36.8 C)(Oral)  RESP:  Physical Exam  Ortho Exam Imaging: No results found.

## 2017-11-18 ENCOUNTER — Telehealth (INDEPENDENT_AMBULATORY_CARE_PROVIDER_SITE_OTHER): Payer: Self-pay | Admitting: Physical Medicine and Rehabilitation

## 2017-11-19 NOTE — Telephone Encounter (Signed)
Could repeat x1 versus facet block in same area or just f/up with Dr. Paulla Fore

## 2017-11-19 NOTE — Telephone Encounter (Signed)
Pt is scheduled for 12/02/17 for inj with driver, pt states if she feels better she will call and cancel appt.

## 2017-11-23 MED FILL — LEVOTHYROXINE 112 MCG TAB: 112 | 90 days supply | Qty: 90 | Fill #1

## 2017-11-30 DIAGNOSIS — H16223 Keratoconjunctivitis sicca, not specified as Sjogren's, bilateral: Secondary | ICD-10-CM | POA: Diagnosis not present

## 2017-11-30 DIAGNOSIS — H2513 Age-related nuclear cataract, bilateral: Secondary | ICD-10-CM | POA: Diagnosis not present

## 2017-11-30 MED FILL — RESTASIS 0.05% EYE EMULSION: 0.05 | 90 days supply | Qty: 180 | Fill #0

## 2017-12-02 ENCOUNTER — Encounter (INDEPENDENT_AMBULATORY_CARE_PROVIDER_SITE_OTHER): Payer: Self-pay | Admitting: Physical Medicine and Rehabilitation

## 2017-12-10 MED FILL — DEXILANT DR 60 MG CAPSULE: 60 | 90 days supply | Qty: 90 | Fill #1

## 2017-12-10 MED FILL — clonazePAM 1 MG TABS: 1 | 7 days supply | Qty: 15 | Fill #1

## 2017-12-15 ENCOUNTER — Encounter: Payer: Self-pay | Admitting: *Deleted

## 2018-01-18 ENCOUNTER — Ambulatory Visit: Payer: 59 | Admitting: Family Medicine

## 2018-01-20 ENCOUNTER — Encounter: Payer: Self-pay | Admitting: Emergency Medicine

## 2018-02-03 ENCOUNTER — Encounter: Payer: Self-pay | Admitting: Family Medicine

## 2018-02-03 ENCOUNTER — Other Ambulatory Visit: Payer: Self-pay

## 2018-02-03 ENCOUNTER — Ambulatory Visit: Payer: 59 | Admitting: Family Medicine

## 2018-02-03 VITALS — BP 110/72 | HR 56 | Temp 97.5°F | Resp 14 | Ht 66.0 in | Wt 97.6 lb

## 2018-02-03 DIAGNOSIS — Z79890 Hormone replacement therapy (postmenopausal): Secondary | ICD-10-CM | POA: Diagnosis not present

## 2018-02-03 DIAGNOSIS — K900 Celiac disease: Secondary | ICD-10-CM

## 2018-02-03 DIAGNOSIS — D509 Iron deficiency anemia, unspecified: Secondary | ICD-10-CM | POA: Diagnosis not present

## 2018-02-03 DIAGNOSIS — E611 Iron deficiency: Secondary | ICD-10-CM

## 2018-02-03 DIAGNOSIS — M7711 Lateral epicondylitis, right elbow: Secondary | ICD-10-CM

## 2018-02-03 DIAGNOSIS — Z0000 Encounter for general adult medical examination without abnormal findings: Secondary | ICD-10-CM | POA: Diagnosis not present

## 2018-02-03 DIAGNOSIS — Z23 Encounter for immunization: Secondary | ICD-10-CM

## 2018-02-03 DIAGNOSIS — K9089 Other intestinal malabsorption: Secondary | ICD-10-CM | POA: Diagnosis not present

## 2018-02-03 DIAGNOSIS — D72821 Monocytosis (symptomatic): Secondary | ICD-10-CM | POA: Diagnosis not present

## 2018-02-03 DIAGNOSIS — R636 Underweight: Secondary | ICD-10-CM | POA: Diagnosis not present

## 2018-02-03 DIAGNOSIS — K219 Gastro-esophageal reflux disease without esophagitis: Secondary | ICD-10-CM

## 2018-02-03 DIAGNOSIS — E039 Hypothyroidism, unspecified: Secondary | ICD-10-CM | POA: Diagnosis not present

## 2018-02-03 HISTORY — DX: Hypothyroidism, unspecified: E03.9

## 2018-02-03 LAB — COMPREHENSIVE METABOLIC PANEL
ALT: 16 U/L (ref 0–35)
AST: 17 U/L (ref 0–37)
Albumin: 4.3 g/dL (ref 3.5–5.2)
Alkaline Phosphatase: 47 U/L (ref 39–117)
BUN: 18 mg/dL (ref 6–23)
CO2: 29 mEq/L (ref 19–32)
Calcium: 9.7 mg/dL (ref 8.4–10.5)
Chloride: 103 mEq/L (ref 96–112)
Creatinine, Ser: 0.75 mg/dL (ref 0.40–1.20)
GFR: 83.63 mL/min (ref >60.00)
Glucose, Bld: 77 mg/dL (ref 70–99)
Potassium: 4.2 mEq/L (ref 3.5–5.1)
Sodium: 138 mEq/L (ref 135–145)
Total Bilirubin: 0.5 mg/dL (ref 0.2–1.2)
Total Protein: 7 g/dL (ref 6.0–8.3)

## 2018-02-03 LAB — CBC WITH DIFFERENTIAL/PLATELET
Basophils Absolute: 0 10*3/uL (ref 0.0–0.1)
Basophils Relative: 0.6 % (ref 0.0–3.0)
Eosinophils Absolute: 0.1 10*3/uL (ref 0.0–0.7)
Eosinophils Relative: 0.9 % (ref 0.0–5.0)
HCT: 41.6 % (ref 36.0–46.0)
Hemoglobin: 13.8 g/dL (ref 12.0–15.0)
Lymphocytes Relative: 30.2 % (ref 12.0–46.0)
Lymphs Abs: 2.3 10*3/uL (ref 0.7–4.0)
MCHC: 33.1 g/dL (ref 30.0–36.0)
MCV: 97 fl (ref 78.0–100.0)
Monocytes Absolute: 0.6 10*3/uL (ref 0.1–1.0)
Monocytes Relative: 7.7 % (ref 3.0–12.0)
Neutro Abs: 4.7 10*3/uL (ref 1.4–7.7)
Neutrophils Relative %: 60.6 % (ref 43.0–77.0)
Platelets: 214 10*3/uL (ref 150.0–400.0)
RBC: 4.29 Mil/uL (ref 3.87–5.11)
RDW: 13.3 % (ref 11.5–15.5)
WBC: 7.8 10*3/uL (ref 4.0–10.5)

## 2018-02-03 LAB — LIPID PANEL
Cholesterol: 188 mg/dL (ref 0–200)
HDL: 76.5 mg/dL (ref >39.00)
LDL Cholesterol: 101 mg/dL — ABNORMAL HIGH (ref 0–99)
NonHDL: 111.58
Total CHOL/HDL Ratio: 2
Triglycerides: 51 mg/dL (ref 0.0–149.0)
VLDL: 10.2 mg/dL (ref 0.0–40.0)

## 2018-02-03 LAB — TSH: TSH: 3 u[IU]/mL (ref 0.35–4.50)

## 2018-02-03 LAB — VITAMIN D 25 HYDROXY (VIT D DEFICIENCY, FRACTURES): VITD: 33.08 ng/mL (ref 30.00–100.00)

## 2018-02-03 NOTE — Progress Notes (Signed)
Subjective  Chief Complaint  Patient presents with  . Establish Care    Last CPE 9-11/2016  . Joint Swelling    Right elbow, HX of osteoarthritis.. Takes Tylenol Arthritis    HPI: Marilyn Collins is a 63 y.o. female who presents to [place] at [facility] today for a Female Wellness Visit. She also has the concerns and/or needs as listed above in the chief complaint and would like a cpe. I discussed with patient that the coding for this visit will include the wellness/preventive visit along with the code for a chronic problem follow up or acute/sick problem visit. Documentation reflects the need for this. . These will be addressed in addition to the Health Maintenance Visit.   Wellness Visit: annual visit with health maintenance review and exam without Pap   S/p hysterectomy. On long term HRT; would prefer not to stop. Low CV risk.   Due mammogram, shingrix. Flu vaccine up todate.   H/o vit D deficiency.   CRC screen up to date.   Underweight chronically; gluten free diet for celiac. [place]. Weight is stable.  Chronic disease f/u and/or acute problem visit: (deemed necessary to be done in addition to the wellness visit):  Hypothyroidism: due for recheck. Had been stable.   GERD on meds.   Has seen heme for monocytosis and iron deficiency. Due for recheck.   Celiac disease: gluten free and does well. Sees GI  Chronic back pain: recently managed by ortho; has seen Paulla Fore. Doing better. MSK and mild djd  C/o right elbow pain for several months. Lateral elbow. No injury. 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 Pain with opening doors and lifting. No wrist pain. 'was swollen'  Assessment  1. Annual physical exam   2. Gastroesophageal reflux disease, esophagitis presence not specified   3. Acquired hypothyroidism   4. Hormone replacement therapy (HRT)   5. Celiac disease   6. Other specified intestinal malabsorption   7. Chronic idiopathic monocytosis   8. Need for  shingles vaccine   9. Lateral epicondylitis of right elbow   10. Underweight   11. Dietary iron deficiency - malabsorption      Plan  Female Wellness Visit:  Age appropriate Health Maintenance and Prevention measures were discussed with patient. Included topics are cancer screening recommendations, ways to keep healthy (see AVS) including dietary and exercise recommendations, regular eye and dental care, use of seat belts, and avoidance of moderate alcohol use and tobacco use. Ordered mammogram.   BMI: discussed patient's BMI and encouraged positive lifestyle modifications to help get to or maintain a target BMI.  HM needs and immunizations were addressed and ordered. See below for orders. See HM and immunization section for updates. Shingrix today. Next due In 6 months. Education given.   Routine labs and screening tests ordered including cmp, cbc and lipids where appropriate.  Discussed recommendations regarding Vit D and calcium supplementation (see AVS)  Chronic disease management visit and/or acute problem visit:  HRT: continue for now. Consider wean in future. Discussed risks/benefits.   Tennis elbow: ICE< wrist splint. Has topical voltaren. Can't use nsaidse. Rest. Return if not improving.   Celiac: gluten free. Encourage increasing calories.   Recheck iron levels.   Hypothyroidism: recheck clinically euthyroid  Follow up: Return in about 1 year (around 02/04/2019) for complete physical.  Orders Placed This Encounter  Procedures  . MM DIGITAL SCREENING BILATERAL  . Varicella-zoster vaccine IM  . CBC with Differential/Platelet  . Comprehensive metabolic panel  .  Lipid panel  . HIV Antibody (routine testing w rflx)  . Hepatitis C antibody  . TSH  . Iron, TIBC and Ferritin Panel  . Vitamin D (25 hydroxy)   No orders of the defined types were placed in this encounter.     Lifestyle: Body mass index is 15.75 kg/m. Wt Readings from Last 3 Encounters:  02/03/18 97  lb 9.6 oz (44.3 kg)  09/15/17 96 lb 1.9 oz (43.6 kg)  09/07/17 95 lb 9.6 oz (43.4 kg)     Patient Active Problem List   Diagnosis Date Noted  . Chronic right-sided thoracic back pain 08/03/2016    Priority: High    Multilevel degenerative changes of lower thoracic spine on x-rays as well as CT scan.  May reflect a true thoracic radiculitis. Osteopathic manipulation and chiropractic treatment have been somewhat beneficial.  Overhead physical activity seems to worsen the symptoms.  She is anterior chain dominant.   . Celiac disease 07/20/2011    Priority: High  . Dietary iron deficiency - malabsorption 07/20/2011    Priority: High    Has had iron infusions in past; related to celiac   . Chronic idiopathic monocytosis 07/20/2011    Priority: High    Diagnosed and managed by Dr. Jana Hakim   . Iron deficiency anemia 07/20/2011    Priority: Medium  . Acquired hypothyroidism 02/03/2018  . Hormone replacement therapy (HRT) 02/03/2018    Total hysterectomy due to premature ovarian failure and endometriosis   . Underweight 02/03/2018  . GERD (gastroesophageal reflux disease)   . Left hamstring strain, initial encounter 03/16/2017    Occurred at the new year.  She was stretching and lunged forward trying to grab something and felt the acute onset of pain.  She has had good steady improvement and is only having minimal symptoms today.   Marland Kitchen Postural kyphosis of thoracic region 03/16/2017  . Neck pain 11/20/2016   Health Maintenance  Topic Date Due  . Hepatitis C Screening  23-Mar-1955  . HIV Screening  07/28/1972  . MAMMOGRAM  01/01/2017  . COLONOSCOPY  12/20/2023  . TETANUS/TDAP  10/04/2026  . INFLUENZA VACCINE  Completed   Immunization History  Administered Date(s) Administered  . Influenza-Unspecified 10/19/2016  . Td 02/26/2009  . Tdap 10/03/2016  . Zoster Recombinat (Shingrix) 02/03/2018   We updated and reviewed the patient's past history in detail and it is documented  below. Allergies: Patient is allergic to morphine and related and venofer [ferric oxide]. Past Medical History Patient  has a past medical history of Acquired hypothyroidism (02/03/2018), Anxiety, Arthritis, Celiac disease (07/20/2011), Chronic idiopathic monocytosis (07/20/2011), Dysphagia (12/2012), GERD (gastroesophageal reflux disease), H/O eating disorder, Hypothyroidism, Iron deficiency anemia, unspecified (07/20/2011), Migraine headache, and Other specified intestinal malabsorption (07/20/2011). Past Surgical History Patient  has a past surgical history that includes Abdominal hysterectomy; Diagnostic laparoscopy; Sinus Irrigation; Esophagogastroduodenoscopy (N/A, 10/20/2012); Colonoscopy (06/2003); TMJ Arthroplasty; and Flexible sigmoidoscopy (2005). Family History: Patient family history includes Cancer in her maternal grandmother and mother; Heart disease in her father, mother, and paternal grandmother; Hyperlipidemia in her father; Hypertension in her brother and father; Stroke in her father and maternal grandfather. Social History:  Patient  reports that she has never smoked. She has never used smokeless tobacco. She reports that she does not drink alcohol or use drugs. Married with 2 sons; 1 adopted. 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   Review of Systems: Constitutional: negative for fever or malaise Ophthalmic: negative for photophobia, double vision or loss of vision Cardiovascular: negative for  chest pain, dyspnea on exertion, or new LE swelling Respiratory: negative for SOB or persistent cough Gastrointestinal: negative for abdominal pain, change in bowel habits or melena Genitourinary: negative for dysuria or gross hematuria, no abnormal uterine bleeding or disharge Musculoskeletal: negative for new gait disturbance or muscular weakness Integumentary: negative for new or persistent rashes, no breast lumps Neurological: negative for TIA or stroke symptoms Psychiatric: negative for SI or  delusions Allergic/Immunologic: negative for hives  Patient Care Team    Relationship Specialty Notifications Start End  Leamon Arnt, MD PCP - General Family Medicine  02/03/18   Jerline Pain, MD Consulting Physician Cardiology  04/03/13   Annia Belt, MD Consulting Physician Oncology  07/27/16    Comment: IV Iron Infusions for Celiac Disease  Ronald Lobo, MD Consulting Physician Gastroenterology  07/27/16   Otelia Sergeant, OD Referring Physician   02/03/18     Objective  Vitals: BP 110/72   Pulse (!) 56   Temp (!) 97.5 F (36.4 C) (Oral)   Resp 14   Ht 5' 6"  (1.676 m)   Wt 97 lb 9.6 oz (44.3 kg)   SpO2 99%   BMI 15.75 kg/m  General:  Well developed, underweight, no acute distress  Psych:  Alert and orientedx3,normal mood and affect HEENT:  Normocephalic, atraumatic, non-icteric sclera, PERRL, oropharynx is clear without mass or exudate, supple neck without adenopathy, mass or thyromegaly Cardiovascular:  Normal S1, S2, RRR without gallop, rub or murmur, nondisplaced PMI Respiratory:  Good breath sounds bilaterally, CTAB with normal respiratory effort Gastrointestinal: normal bowel sounds, soft, non-tender, no noted masses. No HSM MSK: no deformities, contusions. Joints are without erythema or swelling. Spine and CVA region are nontender. Right lateral epicondyle is tender with pain with resisted supination and extension. No redness or warmth or swelling. Nl strength.  Skin:  Warm, no rashes or suspicious lesions noted Neurologic:    Mental status is normal. CN 2-11 are normal. Gross motor and sensory exams are normal. Normal gait. No tremor Breast Exam: No mass, skin retraction or nipple discharge is appreciated in either breast. No axillary adenopathy. Fibrocystic changes are not noted     Commons side effects, risks, benefits, and alternatives for medications and treatment plan prescribed today were discussed, and the patient expressed understanding of the given  instructions. Patient is instructed to call or message via MyChart if he/she has any questions or concerns regarding our treatment plan. No barriers to understanding were identified. We discussed Red Flag symptoms and signs in detail. Patient expressed understanding regarding what to do in case of urgent or emergency type symptoms.   Medication list was reconciled, printed and provided to the patient in AVS. Patient instructions and summary information was reviewed with the patient as documented in the AVS. This note was prepared with assistance of Dragon voice recognition software. Occasional wrong-word or sound-a-like substitutions may have occurred due to the inherent limitations of voice recognition software

## 2018-02-03 NOTE — Patient Instructions (Addendum)
Please return for other concerns. Otherwise I will see you annually for your physicals.   I will release your lab results to you on your MyChart account with further instructions. Please reply with any questions.   We will call you with information regarding your referral appointment. Mammogram. If you do not hear from [place] within the next 2 weeks, please let me know. It can take 1-2 weeks to get appointments set up with the specialists.   It was a pleasure meeting you today! Thank you for choosing [place] to meet your healthcare needs! I truly look forward to working with you. If you have any questions or concerns, please send me a message via Mychart or call the office at [phone].  Please do these things to maintain good health!   Exercise at least 30-45 minutes a day,  4-5 days a week.   Eat a low-fat diet with lots of fruits and vegetables, up to 7-9 servings per day.  Drink plenty of water daily. Try to drink 8 8oz glasses per day.  Seatbelts can save your life. Always wear your seatbelt.  Place Smoke Detectors on every level of your home and check batteries every year.  Schedule an appointment with an eye doctor for an eye exam every 1-2 years  Safe sex - use condoms to protect yourself from STDs if you could be exposed to these types of infections. Use birth control if you do not want to become pregnant and are sexually active.  Avoid heavy alcohol use. If you drink, keep it to less than 2 drinks/day and not every day.  [place].  Choose someone you trust that could speak for you if you became unable to speak for yourself.  Depression is common in our stressful world.If you're feeling down or losing interest in things you normally enjoy, please come in for a visit.  If anyone is threatening or hurting you, please get help. Physical or Emotional Violence is never OK.    Tennis Elbow  Tennis elbow (lateral epicondylitis) is inflammation of tendons in your  outer forearm, near your elbow. Tendons are tissues that connect muscle to bone. When you have tennis elbow, inflammation affects the tendons that you use to bend your wrist and move your hand up. Inflammation occurs in the lower part of the upper arm bone (humerus), where the tendons connect to the bone (lateral epicondyle). Tennis elbow often affects people who play tennis, but anyone may get the condition from repeatedly extending the wrist or turning the forearm. What are the causes? This condition is usually caused by repeatedly extending the wrist, turning the forearm, and using the hands. It can result from sports or work that requires repetitive forearm movements. In some cases, it may be caused by a sudden injury. What increases the risk? You are more likely to develop tennis elbow if you play tennis or another racket sport. You also have a higher risk if you frequently use your hands for work. Besides people who play tennis, others at greater risk include:  Musicians.  Carpenters, painters, and plumbers.  Cooks.  Cashiers.  People who work in [employer].  Architect workers.  Butchers.  People who use computers. What are the signs or symptoms? Symptoms of this condition include:  Pain and tenderness in the forearm and the outer part of the elbow. Pain may be felt only when using the arm, or it may be there all the time.  A burning feeling that starts  in the elbow and spreads down the forearm.  A weak grip in the hand. How is this diagnosed? This condition may be diagnosed based on:  Your symptoms and medical history.  A physical exam.  X-rays.  MRI. How is this treated? Resting and icing your arm is often the first treatment. Your health care provider may also recommend:  Medicines to reduce pain and inflammation. These may be in the form of a pill, topical gels, or shots of a steroid medicine (cortisone).  An elbow strap to reduce stress on the  area.  Physical therapy. This may include massage or exercises.  An elbow brace to restrict the movements that cause symptoms. If these treatments do not help relieve your symptoms, your health care provider may recommend surgery to remove damaged muscle and reattach healthy muscle to bone. Follow these instructions at home: Activity  Rest your elbow and wrist and avoid activities that cause symptoms, as told by your health care provider.  Do physical therapy exercises as instructed.  If you lift an object, lift it with your palm facing up. This reduces stress on your elbow. Lifestyle  If your tennis elbow is caused by sports, check your equipment and make sure that: ? You are using it correctly. ? It is the best fit for you.  If your tennis elbow is caused by work or computer use, take frequent breaks to stretch your arm. Talk with your manager about ways to manage your condition at work. If you have a brace:  Wear the brace or strap as told by your health care provider. Remove it only as told by your health care provider.  Loosen the brace if your fingers tingle, become numb, or turn cold and blue.  Keep the brace clean.  If the brace is not waterproof, ask if you may remove it for bathing. If you must keep the brace on while bathing: ? Do not let it get wet. ? Cover it with a watertight covering when you take a bath or a shower. General instructions   If directed, put ice on the painful area: ? Put ice in a plastic bag. ? Place a towel between your skin and the bag. ? Leave the ice on for 20 minutes, 2-3 times a day.  Take over-the-counter and prescription medicines only as told by your health care provider.  Keep all follow-up visits as told by your health care provider. This is important. Contact a health care provider if:  You have pain that gets worse or does not get better with treatment.  You have numbness or weakness in your forearm, hand, or  fingers. Summary  Tennis elbow (lateral epicondylitis) is inflammation of tendons in your outer forearm, near your elbow.  Common symptoms include pain and tenderness in your forearm and the outer part of your elbow.  This condition is usually caused by repeatedly extending your wrist, turning your forearm, and using your hands.  The first treatment is often resting and icing your arm to relieve symptoms. Further treatment may include taking medicine, getting physical therapy, wearing a brace or strap, or having surgery. This information is not intended to replace advice given to you by your health care provider. Make sure you discuss any questions you have with your health care provider. Document Released: 01/19/2005 Document Revised: 11/03/2016 Document Reviewed: 11/03/2016 Elsevier Interactive Patient Education  2019 [employer].

## 2018-02-04 LAB — HIV ANTIBODY (ROUTINE TESTING W REFLEX): HIV 1&2 Ab, 4th Generation: NONREACTIVE

## 2018-02-04 LAB — IRON,TIBC AND FERRITIN PANEL
%SAT: 47 % (calc) — ABNORMAL HIGH (ref 16–45)
Ferritin: 59 ng/mL (ref 16–232)
Iron: 131 ug/dL (ref 45–160)
TIBC: 279 mcg/dL (calc) (ref 250–450)

## 2018-02-04 LAB — HEPATITIS C ANTIBODY
Hepatitis C Ab: NONREACTIVE
SIGNAL TO CUT-OFF: 0.05 (ref <1.00)

## 2018-02-07 MED FILL — clonazePAM 1 MG TABS: 1 | 7 days supply | Qty: 15 | Fill #2

## 2018-02-15 ENCOUNTER — Other Ambulatory Visit: Payer: Self-pay | Admitting: *Deleted

## 2018-02-15 ENCOUNTER — Encounter: Payer: Self-pay | Admitting: Family Medicine

## 2018-02-15 MED ORDER — DEXLANSOPRAZOLE 60 MG PO CPDR: 60.0000 mg | DELAYED_RELEASE_CAPSULE | Freq: Every day | ORAL | 1 refills | Status: DC

## 2018-02-15 MED ORDER — LEVOTHYROXINE SODIUM 112 MCG PO TABS: 112.0000 ug | ORAL_TABLET | Freq: Every day | ORAL | 1 refills | Status: DC

## 2018-02-15 MED FILL — LEVOTHYROXINE 112 MCG TAB: 112 | 90 days supply | Qty: 90 | Fill #0

## 2018-02-22 ENCOUNTER — Encounter: Payer: Self-pay | Admitting: Family Medicine

## 2018-02-25 ENCOUNTER — Ambulatory Visit: Payer: 59 | Admitting: Family Medicine

## 2018-02-25 ENCOUNTER — Other Ambulatory Visit: Payer: Self-pay

## 2018-02-25 ENCOUNTER — Encounter: Payer: Self-pay | Admitting: Family Medicine

## 2018-02-25 VITALS — BP 102/62 | HR 52 | Temp 97.4°F | Resp 14 | Ht 66.0 in | Wt 97.0 lb

## 2018-02-25 DIAGNOSIS — M7711 Lateral epicondylitis, right elbow: Secondary | ICD-10-CM | POA: Diagnosis not present

## 2018-02-25 DIAGNOSIS — M7701 Medial epicondylitis, right elbow: Secondary | ICD-10-CM

## 2018-02-25 MED ORDER — TRIAMCINOLONE ACETONIDE 40 MG/ML IJ SUSP
20.0000 mg | Freq: Once | INTRAMUSCULAR | Status: AC
  Administered 2018-02-25: 20 mg via INTRA_ARTICULAR

## 2018-02-25 NOTE — Progress Notes (Signed)
Subjective  CC:  Chief Complaint  Patient presents with  . Elbow Pain    Right side, pain for several weeks.. Wants to have injection    HPI: Marilyn Collins is a 63 y.o. female who presents to the office today to address the problems listed above in the chief complaint.  Right elbow pain, first assessed earlier this month. Has tried topical nsaids, rest, wrist splint and elbow splint with minimal improvement in sxs for tennis elbow. 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 Now with pain at rest at time. Some swelling. No wrist pain. No numbness or weakness in right UE.  Also c/o medial elbow pain today. Has noted that both sides of the elbow are sore; however the medial elbow is mild when compare to the lateral elbow    Assessment  1. Lateral epicondylitis of right elbow   2. Medial epicondylitis, right      Plan   Lateral > Medial epicondylitis of right elbow:  Due to severity, injected lateral epicondyle today with steroids. See below. Improved post injections. Routine post procedure care instructions were given to patient in detail. rec complete rest from exercise (UE) x 2 weeks. Wrist splint x 1 week, then tennis elbow strap at work.   Medical epicondylitis: RICE therapy and topical nsaids. If persists, return for steroid injection. Discussed motions that cause both problems. Her work outs with the weighted barbell are causing her problems.   Follow up: prn  08/09/2018  No orders of the defined types were placed in this encounter.  Meds ordered this encounter  Medications  . triamcinolone acetonide (KENALOG-40) injection 20 mg      I reviewed the patients updated PMH, FH, and SocHx.    Patient Active Problem List   Diagnosis Date Noted  . Chronic right-sided thoracic back pain 08/03/2016    Priority: High  . Celiac disease 07/20/2011    Priority: High  . Dietary iron deficiency - malabsorption 07/20/2011    Priority: High  . Chronic  idiopathic monocytosis 07/20/2011    Priority: High  . Iron deficiency anemia 07/20/2011    Priority: Medium  . Acquired hypothyroidism 02/03/2018  . Hormone replacement therapy (HRT) 02/03/2018  . Underweight 02/03/2018  . GERD (gastroesophageal reflux disease)   . Left hamstring strain, initial encounter 03/16/2017  . Postural kyphosis of thoracic region 03/16/2017  . Neck pain 11/20/2016   Current Meds  Medication Sig  . bifidobacterium infantis (ALIGN) capsule Take 1 capsule by mouth daily.  . cholecalciferol (VITAMIN D3) 25 MCG (1000 UT) tablet Take 1,000 Units by mouth daily.  . clonazePAM (KLONOPIN) 0.5 MG tablet Take 0.25 mg by mouth at bedtime.  Marland Kitchen dexlansoprazole (DEXILANT) 60 MG capsule Take 1 capsule (60 mg total) by mouth daily.  . Diclofenac Sodium (PENNSAID) 2 % SOLN Place 1 application onto the skin 2 (two) times daily.  Marland Kitchen docusate sodium (COLACE) 100 MG capsule Take 100 mg by mouth daily.  Marland Kitchen estradiol (VIVELLE-DOT) 0.05 MG/24HR Place 1 patch onto the skin 2 (two) times a week.  . levothyroxine (SYNTHROID, LEVOTHROID) 112 MCG tablet Take 1 tablet (112 mcg total) by mouth daily.  Marland Kitchen loratadine (CLARITIN) 10 MG tablet Take 5 mg by mouth daily.  . Magnesium 250 MG TABS Take by mouth.  . Methylcellulose, Laxative, (CITRUCEL PO) Take 1 scoop by mouth daily.  . SUMAtriptan (IMITREX) 50 MG tablet Take 50 mg by mouth every 2 (two) hours as needed  for migraine. May repeat in 2 hours if headache persists or recurs.  Marland Kitchen tiZANidine (ZANAFLEX) 4 MG capsule Take 1 tablet by mouth at bedtime as needed, or as directed.  . vitamin B-12 (CYANOCOBALAMIN) 1000 MCG tablet Take 1,000 mcg by mouth daily.  Marland Kitchen zinc gluconate 50 MG tablet Take 50 mg by mouth daily.    Allergies: Patient is allergic to morphine and related and venofer [ferric oxide]. Family History: Patient family history includes Cancer in her maternal grandmother and mother; Heart disease in her father, mother, and paternal  grandmother; Hyperlipidemia in her father; Hypertension in her brother and father; Stroke in her father and maternal grandfather. Social History:  Patient  reports that she has never smoked. She has never used smokeless tobacco. She reports that she does not drink alcohol or use drugs.  Review of Systems: Constitutional: Negative for fever malaise or anorexia Cardiovascular: negative for chest pain Respiratory: negative for SOB or persistent cough Gastrointestinal: negative for abdominal pain  Objective  Vitals: BP 102/62   Pulse (!) 52   Temp (!) 97.4 F (36.3 C) (Oral)   Resp 14   Ht 5' 6"  (1.676 m)   Wt 97 lb (44 kg)   SpO2 99%   BMI 15.66 kg/m  General: no acute distress , A&Ox3 Elbow: right lateral elbow with mild swelling w/o warmth or redness over lateral epicondyle with focal ttp over insertion of tendon. Nl olecranon. Medical epicondyle is mildly tender as well. Pain with resisted supination and pronation. Normal bracioradialis mm w/o ttp. Nl grip.   Lateral epicondylitis Steroid Injection Procedure Note  Pre-operative Diagnosis: right lateral epicondylitis  Post-operative Diagnosis: same  Indications: pain and treatment   Anesthesia:cold spray  Procedure Details   Verbal consent was obtained for the procedure. Universal time out done. The point of maximum tenderness was identified and marked. The skin prepped with alcohol and cold spray was used for anesthesia.  A 1" 25ga needle was advanced into the skin over the lateral epicondyle and proximal ligament and  0.25 ml of triamcinolone (KENALOG) 81m/ml with 0.719mof lidocaine 1% was injected.   Complications:  None; patient tolerated the procedure well.     Commons side effects, risks, benefits, and alternatives for medications and treatment plan prescribed today were discussed, and the patient expressed understanding of the given instructions. Patient is instructed to call or message via MyChart if he/she has any  questions or concerns regarding our treatment plan. No barriers to understanding were identified. We discussed Red Flag symptoms and signs in detail. Patient expressed understanding regarding what to do in case of urgent or emergency type symptoms.   Medication list was reconciled, printed and provided to the patient in AVS. Patient instructions and summary information was reviewed with the patient as documented in the AVS. This note was prepared with assistance of Dragon voice recognition software. Occasional wrong-word or sound-a-like substitutions may have occurred due to the inherent limitations of voice recognition software

## 2018-02-25 NOTE — Patient Instructions (Signed)
Please follow up if symptoms do not improve or as needed.   You had a steroid injection today.   Things to be aware of after this injection are listed below:  You may experience no significant improvement or even a slight worsening in your symptoms during the first 24 to 48 hours.  After that we expect your symptoms to improve gradually over the next 2 weeks for the medicine to have its maximal effect.  You should continue to have improvement out to 6 weeks after your injection.  I recommend icing the site of the injection for 20 minutes  1-2 times the day of your injection  You may shower but no swimming, tub bath or Jacuzzi for 24 hours.  If your bandage falls off this does not need to be replaced.  It is appropriate to remove the bandage after 4 hours.  You may resume light activities as tolerated.     POSSIBLE PROCEDURE SIDE EFFECTS: The side effects of the injection are usually fairly minimal however if you may experience some of the following side effects that are usually self-limited and will is off on their own.  If you are concerned please feel free to call the office with questions:             Increased numbness or tingling             Nausea or vomiting             Swelling or bruising at the injection site    Please call our office if if you experience any of the following symptoms over the next week as these can be signs of infection:              Fever greater than 100.79F             Significant swelling at the injection site             Significant redness or drainage from the injection site

## 2018-03-02 ENCOUNTER — Telehealth (INDEPENDENT_AMBULATORY_CARE_PROVIDER_SITE_OTHER): Payer: Self-pay | Admitting: *Deleted

## 2018-03-02 NOTE — Telephone Encounter (Signed)
If she feels like the last injection helped, including the buttock pain then repeat or repeat and we can talk if lower buttock pain is different

## 2018-03-02 NOTE — Telephone Encounter (Signed)
Pt is scheduled for 03/17/2018 with driver, no BTs.

## 2018-03-08 ENCOUNTER — Encounter: Payer: Self-pay | Admitting: Family Medicine

## 2018-03-08 DIAGNOSIS — K9089 Other intestinal malabsorption: Secondary | ICD-10-CM

## 2018-03-08 DIAGNOSIS — K900 Celiac disease: Secondary | ICD-10-CM

## 2018-03-08 DIAGNOSIS — D72821 Monocytosis (symptomatic): Secondary | ICD-10-CM

## 2018-03-08 DIAGNOSIS — D509 Iron deficiency anemia, unspecified: Secondary | ICD-10-CM

## 2018-03-08 MED ORDER — ESTRADIOL 0.05 MG/24HR TD PTTW: 1.0000 | MEDICATED_PATCH | TRANSDERMAL | 3 refills | Status: DC

## 2018-03-08 MED FILL — DOTTI 0.05 MG/24HR PTTW: 0.05 | 28 days supply | Qty: 8 | Fill #0

## 2018-03-08 MED FILL — DEXILANT DR 60 MG CAPSULE: 60 | 90 days supply | Qty: 90 | Fill #0 | Status: TO

## 2018-03-16 ENCOUNTER — Other Ambulatory Visit: Payer: Self-pay | Admitting: Family Medicine

## 2018-03-16 DIAGNOSIS — Z1231 Encounter for screening mammogram for malignant neoplasm of breast: Secondary | ICD-10-CM

## 2018-03-17 ENCOUNTER — Ambulatory Visit (INDEPENDENT_AMBULATORY_CARE_PROVIDER_SITE_OTHER): Payer: 59 | Admitting: Physical Medicine and Rehabilitation

## 2018-03-17 ENCOUNTER — Encounter (INDEPENDENT_AMBULATORY_CARE_PROVIDER_SITE_OTHER): Payer: Self-pay | Admitting: Physical Medicine and Rehabilitation

## 2018-03-17 ENCOUNTER — Ambulatory Visit (INDEPENDENT_AMBULATORY_CARE_PROVIDER_SITE_OTHER): Payer: Self-pay

## 2018-03-17 VITALS — BP 108/60 | HR 57 | Temp 97.7°F

## 2018-03-17 DIAGNOSIS — G8929 Other chronic pain: Secondary | ICD-10-CM

## 2018-03-17 DIAGNOSIS — M546 Pain in thoracic spine: Secondary | ICD-10-CM | POA: Diagnosis not present

## 2018-03-17 DIAGNOSIS — M5414 Radiculopathy, thoracic region: Secondary | ICD-10-CM

## 2018-03-17 DIAGNOSIS — M519 Unspecified thoracic, thoracolumbar and lumbosacral intervertebral disc disorder: Secondary | ICD-10-CM

## 2018-03-17 DIAGNOSIS — R0781 Pleurodynia: Secondary | ICD-10-CM | POA: Diagnosis not present

## 2018-03-17 MED ORDER — METHYLPREDNISOLONE ACETATE 80 MG/ML IJ SUSP
80.0000 mg | Freq: Once | INTRAMUSCULAR | Status: AC
  Administered 2018-03-17: 80 mg

## 2018-03-17 NOTE — Progress Notes (Signed)
.  Numeric Pain Rating Scale and Functional Assessment Average Pain 6   In the last MONTH (on 0-10 scale) has pain interfered with the following?  1. General activity like being  able to carry out your everyday physical activities such as walking, climbing stairs, carrying groceries, or moving a chair?  Rating(5)   +Driver, -BT, -Dye Allergies.  

## 2018-04-07 NOTE — Progress Notes (Signed)
Marilyn Collins - 63 y.o. female MRN [id number]  Date of birth: 1955/12/24  Office Visit Note: Visit Date: 03/17/2018 PCP: Leamon Arnt, MD Referred by: Leamon Arnt, MD  Subjective: Chief Complaint  Patient presents with  . Middle Back - Pain   HPI: Marilyn Collins is a 63 y.o. female who comes in today For evaluation of thoracic back pain with now some onset of right-sided rib pain.  We completed injection which was a thoracic interlaminar epidural steroid injection at T7-8 on 11/04/2017 which she says helped quite a bit.  I think diagnostically this goes a long way into may be showing that the small disc protrusions that were present could be a pain generator.  She was followed by Dr. Teresa Coombs who she has not seen recently but still sees him.  She has had a good course of physical therapy which I think included dry needling.  She reported this did not seem to help at that time.  She has not had any new trauma or falls.  She is a very thin individual weighing 97 pounds.  She reports no bowel or bladder changes no fevers chills or night sweats or any unintended weight loss.  She denies any radicular pain down the legs.  Pain seems to be worse if she leans in the recliner for a long time but not necessarily endorsing more pain with standing and walking.  She rates her pain as a 6 out of 10.  Review of Systems  Constitutional: Negative for chills, fever, malaise/fatigue and weight loss.  HENT: Negative for hearing loss and sinus pain.   Eyes: Negative for blurred vision, double vision and photophobia.  Respiratory: Negative for cough and shortness of breath.   Cardiovascular: Negative for chest pain, palpitations and leg swelling.  Gastrointestinal: Negative for abdominal pain, nausea and vomiting.  Genitourinary: Negative for flank pain.  Musculoskeletal: Positive for back pain. Negative for myalgias.       Thoracic back pain with rib pain  Skin: Negative for itching and rash.    Neurological: Negative for tremors, focal weakness and weakness.  Endo/Heme/Allergies: Negative.   Psychiatric/Behavioral: Negative for depression.  All other systems reviewed and are negative.  Otherwise per HPI.  Assessment & Plan: Visit Diagnoses:  1. Thoracic radiculopathy   2. Chronic right-sided thoracic back pain   3. Thoracic disc disease   4. Rib pain on right side     Plan: Findings:  Chronic worsening severe at times thoracic mid back pain really at the level below the shoulder blades.  She does have focal trigger points that I think are at least contributing to some of the pain but she did get relief with prior epidural injection.  MRI of the thoracic spine has been performed and reviewed again does show disc protrusion without compression.  She has had no compression fractures noted.  She is a thin individual.  She has had dry needling but I wonder if trigger point injection by myself would do any different.  We are going to repeat the epidural injection today because she did get such good relief back in October.  We will see her back depending on her results.  Should continue with current activity and strengthening.  If rib pain were more problematic could consider intercostal nerve block.    Meds & Orders:  Meds ordered this encounter  Medications  . methylPREDNISolone acetate (DEPO-MEDROL) injection 80 mg    Orders Placed This Encounter  Procedures  .  XR C-ARM NO REPORT  . Epidural Steroid injection    Follow-up: Return if symptoms worsen or fail to improve.   Procedures: No procedures performed  Thoracic epidural Steroid Injection - Interlaminar Approach with Fluoroscopic Guidance  Patient: Marilyn Collins      Date of Birth: [birth year] MRN: [id number] PCP: Leamon Arnt, MD      Visit Date: 03/17/2018   Universal Protocol:     Consent Given By: the patient  Position: PRONE  Additional Comments: Vital signs were monitored before and after the  procedure. Patient was prepped and draped in the usual sterile fashion. The correct patient, procedure, and site was verified.   Injection Procedure Details:  Procedure Site One Meds Administered:  Meds ordered this encounter  Medications  . methylPREDNISolone acetate (DEPO-MEDROL) injection 80 mg     Laterality: Right  Location/Site:  T7-8  Needle size: 20 G  Needle type: Tuohy  Needle Placement: Paramedian epidural  Findings:   -Comments: Excellent flow of contrast into the epidural space.  Procedure Details: Using a paramedian approach from the side mentioned above, the region overlying the inferior lamina was localized under fluoroscopic visualization and the soft tissues overlying this structure were infiltrated with 4 ml. of 1% Lidocaine without Epinephrine. The Tuohy needle was inserted into the epidural space using a paramedian approach.   The epidural space was localized using loss of resistance along with lateral and bi-planar fluoroscopic views.  After negative aspirate for air, blood, and CSF, a 2 ml. volume of Isovue-250 was injected into the epidural space and the flow of contrast was observed. Radiographs were obtained for documentation purposes.    The injectate was administered into the level noted above.   Additional Comments:  The patient tolerated the procedure well Dressing: 2 x 2 sterile gauze and Band-Aid    Post-procedure details: Patient was observed during the procedure. Post-procedure instructions were reviewed.  Patient left the clinic in stable condition.   Clinical History: MRI THORACIC SPINE WITHOUT CONTRAST  TECHNIQUE: Multiplanar, multisequence MR imaging of the thoracic spine was performed. No intravenous contrast was administered.  COMPARISON:  Thoracic spine MRI 05/04/2005.  FINDINGS: Limited cervical spine imaging: Partially visible C6-C7 disc space loss and disc herniation on series 4, image 6.  Thoracic spine  segmentation: Appears normal. The same numbering system is used today as on the 2007 MRI.  Alignment: Stable thoracic vertebral height and alignment since 2007. Mild straightening of lower thoracic kyphosis.  Vertebrae: Several benign chronic vertebral body hemangiomas in the lower thoracic spine are stable since 2007, most notable at T11 and T12. Underlying bone marrow signal remains within normal limits. Mild chronic endplate irregularity inferiorly at T10, and at T11-T12 is stable since 2007. No marrow edema or evidence of acute osseous abnormality.  Cord: Spinal cord signal is within normal limits at all visualized levels. Capacious thoracic spinal canal. Conus medullaris occurs below the T12-L1 level and is not visible.  Paraspinal and other soft tissues: Negative visualized thoracic inlet and thoracic viscera. Negative visualized posterior paraspinal soft tissues.  Disc levels:  T1-T2: Negative.  T2-T3: Mild facet hypertrophy.  No stenosis.  T3-T4: Borderline to mild facet hypertrophy.  No stenosis.  T4-T5: Negative.  T5-T6: Negative.  T6-T7: Subtle central disc protrusion is new since 2007 but only faintly visible (series 4, image 7). No stenosis.  T7-T8: Small lobulated left greater than right paracentral disc protrusions are new or increased since 2007, best seen on series 8, image 23. No  associated stenosis.  T8-T9: Subtle if any left paracentral disc protrusion.  No stenosis.  T9-T10: Mild facet hypertrophy.  No stenosis.  T10-T11: Minimal disc bulge. Mild facet and ligament flavum hypertrophy. No stenosis.  T11-T12: Minimal disc bulge.  No stenosis.  T12-L1: Negative.  IMPRESSION: 1. Largely stable MRI appearance of the thoracic spine since 2007. No acute or interval osseous abnormality. 2. Mild for age thoracic spine degeneration and capacious underlying thoracic spinal canal. Subtle disc protrusions from T6-T7 to T8-T9 are new or  have increased since 2007, but results in no spinal stenosis or neural impingement.   Electronically Signed   By: Genevie Ann M.D.   On: 01/28/2017 15:25   She reports that she has never smoked. She has never used smokeless tobacco. No results for input(s): HGBA1C, LABURIC in the last 8760 hours.  Objective:  VS:  HT:    WT:   BMI:     BP:108/60  HR:(!) 57bpm  TEMP:97.7 F (36.5 C)(Oral)  RESP:  Physical Exam Vitals signs and nursing note reviewed.  Constitutional:      General: She is not in acute distress.    Appearance: Normal appearance. She is well-developed. She is not ill-appearing.  HENT:     Head: Normocephalic and atraumatic.  Eyes:     Conjunctiva/sclera: Conjunctivae normal.     Pupils: Pupils are equal, round, and reactive to light.  Cardiovascular:     Rate and Rhythm: Normal rate.     Pulses: Normal pulses.  Pulmonary:     Effort: Pulmonary effort is normal.  Musculoskeletal:     Right lower leg: No edema.     Left lower leg: No edema.     Comments: Patient stands with forward flexed cervical spine with some increased kyphosis of the thoracic spine without scoliosis.  There are focal trigger points and taut bands along the paraspinal musculature in the mid thoracic region.  There are trigger points noted in the rhomboids.  She has no pain to palpation along the rib margins.  No pain with rocking the vertebral bodies.  Has good distal strength and ambulates without aid.  Skin:    General: Skin is warm and dry.     Findings: No erythema or rash.  Neurological:     General: No focal deficit present.     Mental Status: She is alert and oriented to person, place, and time.     Sensory: No sensory deficit.     Motor: No abnormal muscle tone.     Coordination: Coordination normal.     Gait: Gait normal.  Psychiatric:        Mood and Affect: Mood normal.        Behavior: Behavior normal.     Ortho Exam Imaging: No results found.  Past  Medical/Family/Surgical/Social History: Medications & Allergies reviewed per EMR, new medications updated. Patient Active Problem List   Diagnosis Date Noted  . Acquired hypothyroidism 02/03/2018  . Hormone replacement therapy (HRT) 02/03/2018  . Underweight 02/03/2018  . GERD (gastroesophageal reflux disease)   . Left hamstring strain, initial encounter 03/16/2017  . Postural kyphosis of thoracic region 03/16/2017  . Neck pain 11/20/2016  . Chronic right-sided thoracic back pain 08/03/2016  . Celiac disease 07/20/2011  . Iron deficiency anemia 07/20/2011  . Dietary iron deficiency - malabsorption 07/20/2011  . Chronic idiopathic monocytosis 07/20/2011   Past Medical History:  Diagnosis Date  . Acquired hypothyroidism 02/03/2018  . Anxiety   . Arthritis   .  Celiac disease 07/20/2011  . Chronic idiopathic monocytosis 07/20/2011  . Dysphagia 12/2012   responsive to dexilant  . GERD (gastroesophageal reflux disease)   . H/O eating disorder   . Hypothyroidism   . Iron deficiency anemia, unspecified 07/20/2011  . Migraine headache   . Other specified intestinal malabsorption 07/20/2011   Family History  Problem Relation Age of Onset  . Cancer Mother   . Heart disease Mother   . Heart disease Father   . Hyperlipidemia Father   . Hypertension Father   . Stroke Father   . Hypertension Brother   . Cancer Maternal Grandmother   . Heart disease Paternal Grandmother   . Stroke Maternal Grandfather    Past Surgical History:  Procedure Laterality Date  . ABDOMINAL HYSTERECTOMY    . COLONOSCOPY  06/2003   neg  . DIAGNOSTIC LAPAROSCOPY    . ESOPHAGOGASTRODUODENOSCOPY N/A 10/20/2012   Procedure: ESOPHAGOGASTRODUODENOSCOPY (EGD);  Surgeon: Cleotis Nipper, MD;  Location: [facility];  Service: Endoscopy;  Laterality: N/A;  . FLEXIBLE SIGMOIDOSCOPY  2005  . SINUS IRRIGATION    . TMJ ARTHROPLASTY     Social History   Occupational History  . Occupation: [place] Program[employer], multimedia: [employer]  Tobacco Use  . Smoking status: Never Smoker  . Smokeless tobacco: Never Used  Substance and Sexual Activity  . Alcohol use: No    Alcohol/week: 0.0 standard drinks  . Drug use: No  . Sexual activity: Yes    Birth control/protection: Surgical

## 2018-04-07 NOTE — Procedures (Signed)
Thoracic epidural Steroid Injection - Interlaminar Approach with Fluoroscopic Guidance  Patient: Marilyn Collins      Date of Birth: 04-Feb-1955 MRN: [id number] PCP: Leamon Arnt, MD      Visit Date: 03/17/2018   Universal Protocol:     Consent Given By: the patient  Position: PRONE  Additional Comments: Vital signs were monitored before and after the procedure. Patient was prepped and draped in the usual sterile fashion. The correct patient, procedure, and site was verified.   Injection Procedure Details:  Procedure Site One Meds Administered:  Meds ordered this encounter  Medications  . methylPREDNISolone acetate (DEPO-MEDROL) injection 80 mg     Laterality: Right  Location/Site:  T7-8  Needle size: 20 G  Needle type: Tuohy  Needle Placement: Paramedian epidural  Findings:   -Comments: Excellent flow of contrast into the epidural space.  Procedure Details: Using a paramedian approach from the side mentioned above, the region overlying the inferior lamina was localized under fluoroscopic visualization and the soft tissues overlying this structure were infiltrated with 4 ml. of 1% Lidocaine without Epinephrine. The Tuohy needle was inserted into the epidural space using a paramedian approach.   The epidural space was localized using loss of resistance along with lateral and bi-planar fluoroscopic views.  After negative aspirate for air, blood, and CSF, a 2 ml. volume of Isovue-250 was injected into the epidural space and the flow of contrast was observed. Radiographs were obtained for documentation purposes.    The injectate was administered into the level noted above.   Additional Comments:  The patient tolerated the procedure well Dressing: 2 x 2 sterile gauze and Band-Aid    Post-procedure details: Patient was observed during the procedure. Post-procedure instructions were reviewed.  Patient left the clinic in stable condition.

## 2018-04-08 ENCOUNTER — Ambulatory Visit: Payer: 59 | Admitting: Family Medicine

## 2018-04-08 ENCOUNTER — Other Ambulatory Visit: Payer: Self-pay

## 2018-04-08 ENCOUNTER — Encounter: Payer: Self-pay | Admitting: Family Medicine

## 2018-04-08 VITALS — BP 104/54 | HR 71 | Temp 98.0°F | Resp 14 | Ht 66.0 in | Wt 97.6 lb

## 2018-04-08 DIAGNOSIS — R202 Paresthesia of skin: Secondary | ICD-10-CM

## 2018-04-08 DIAGNOSIS — M7021 Olecranon bursitis, right elbow: Secondary | ICD-10-CM

## 2018-04-08 LAB — VITAMIN B12: Vitamin B-12: 656 pg/mL (ref 211–911)

## 2018-04-08 MED ORDER — TRIAMCINOLONE ACETONIDE 0.1 % EX CREA: 1.0000 "application " | TOPICAL_CREAM | Freq: Two times a day (BID) | CUTANEOUS | 0 refills | Status: DC

## 2018-04-08 MED FILL — clonazePAM 1 MG TABS: 1 | 7 days supply | Qty: 15 | Fill #3

## 2018-04-08 MED FILL — TRIAMCINOLONE 0.1% CREAM: 0.1 | 25 days supply | Qty: 45 | Fill #0

## 2018-04-08 NOTE — Progress Notes (Signed)
Subjective  CC:  Chief Complaint  Patient presents with  . Elbow Pain    Right side  . Peripheral Neuropathy    bilateral feet goes halfway up calf, worse at night.. Numbness, coldness, tingling    HPI: Marilyn Collins is a 63 y.o. female who presents to the office today to address the problems listed above in the chief complaint.  Patient was here and treated for lateral epicondylitis last month.  That has resolved.  However over the last 1 to 2 weeks she is developed a right sided elbow rash that is flaking and mild swelling of the elbow.  No trauma.  No increase in exercise or use of the elbow.  No pain.  Always has cold hands and cold feet.  Occasionally will have numbness in the toes especially after walking on the treadmill or being on her feet for 12 hours at work.  But now feels a discomfort up to the mid calf at night.  This is usually associated with cold.  Worse in the cold months.  Better in the spring and summer.  No pain.  No burning or stinging.  No rash. Assessment  1. Olecranon bursitis, right elbow   2. Paresthesia of both feet      Plan   Mild olecranon bursitis with rash: Eczematous-like rash, treat with triamcinolone.  Question traumatic etiology.  Education given.  Will monitor.  Check lab work for paresthesias.  Suspect more related to cold feet and due to very thin body habitus.  Supportive care.  Monitor and if worsens will check nerve conduction studies.  Patient understands and agrees with care plan  Follow up: Return if symptoms worsen or fail to improve.  08/09/2018  Orders Placed This Encounter  Procedures  . Folate RBC  . Vitamin B12   Meds ordered this encounter  Medications  . triamcinolone cream (KENALOG) 0.1 %    Sig: Apply 1 application topically 2 (two) times daily. For 2 weeks, then as needed    Dispense:  45 g    Refill:  0      I reviewed the patients updated PMH, FH, and SocHx.    Patient Active Problem List   Diagnosis Date Noted    . Chronic right-sided thoracic back pain 08/03/2016    Priority: High  . Celiac disease 07/20/2011    Priority: High  . Dietary iron deficiency - malabsorption 07/20/2011    Priority: High  . Chronic idiopathic monocytosis 07/20/2011    Priority: High  . Iron deficiency anemia 07/20/2011    Priority: Medium  . Acquired hypothyroidism 02/03/2018  . Hormone replacement therapy (HRT) 02/03/2018  . Underweight 02/03/2018  . GERD (gastroesophageal reflux disease)   . Left hamstring strain, initial encounter 03/16/2017  . Postural kyphosis of thoracic region 03/16/2017  . Neck pain 11/20/2016   Current Meds  Medication Sig  . bifidobacterium infantis (ALIGN) capsule Take 1 capsule by mouth daily.  . cholecalciferol (VITAMIN D3) 25 MCG (1000 UT) tablet Take 1,000 Units by mouth daily.  . clonazePAM (KLONOPIN) 0.5 MG tablet Take 0.25 mg by mouth at bedtime.  Marland Kitchen dexlansoprazole (DEXILANT) 60 MG capsule Take 1 capsule (60 mg total) by mouth daily.  . Diclofenac Sodium (PENNSAID) 2 % SOLN Place 1 application onto the skin 2 (two) times daily.  Marland Kitchen docusate sodium (COLACE) 100 MG capsule Take 100 mg by mouth daily.  Marland Kitchen estradiol (VIVELLE-DOT) 0.05 MG/24HR patch Place 1 patch (0.05 mg total) onto the skin  2 (two) times a week.  . levothyroxine (SYNTHROID, LEVOTHROID) 112 MCG tablet Take 1 tablet (112 mcg total) by mouth daily.  . Magnesium 250 MG TABS Take by mouth.  . Methylcellulose, Laxative, (CITRUCEL PO) Take 1 scoop by mouth daily.  . SUMAtriptan (IMITREX) 50 MG tablet Take 50 mg by mouth every 2 (two) hours as needed for migraine. May repeat in 2 hours if headache persists or recurs.  Marland Kitchen tiZANidine (ZANAFLEX) 4 MG capsule Take 1 tablet by mouth at bedtime as needed, or as directed.  . vitamin B-12 (CYANOCOBALAMIN) 1000 MCG tablet Take 1,000 mcg by mouth daily.  Marland Kitchen zinc gluconate 50 MG tablet Take 50 mg by mouth daily.    Allergies: Patient is allergic to morphine and related and venofer  [ferric oxide]. Family History: Patient family history includes Cancer in her maternal grandmother and mother; Heart disease in her father, mother, and paternal grandmother; Hyperlipidemia in her father; Hypertension in her brother and father; Stroke in her father and maternal grandfather. Social History:  Patient  reports that she has never smoked. She has never used smokeless tobacco. She reports that she does not drink alcohol or use drugs.  Review of Systems: Constitutional: Negative for fever malaise or anorexia Cardiovascular: negative for chest pain Respiratory: negative for SOB or persistent cough Gastrointestinal: negative for abdominal pain  Objective  Vitals: BP (!) 104/54   Pulse 71   Temp 98 F (36.7 C) (Oral)   Resp 14   Ht 5' 6"  (1.676 m)   Wt 97 lb 9.6 oz (44.3 kg)   SpO2 98%   BMI 15.75 kg/m  General: no acute distress , A&Ox3 Right elbow: Nontender medial and lateral epicondyle, olecranon bursa is mildly thickened without tenderness warmth redness or fluctuance.  Overlying excoriation and flaking rash Skin:  Warm,  Extremities: Normal-appearing feet, +2 distal pulses bilaterally.  Normal sensation.  No edema.  No color changes    Commons side effects, risks, benefits, and alternatives for medications and treatment plan prescribed today were discussed, and the patient expressed understanding of the given instructions. Patient is instructed to call or message via MyChart if he/she has any questions or concerns regarding our treatment plan. No barriers to understanding were identified. We discussed Red Flag symptoms and signs in detail. Patient expressed understanding regarding what to do in case of urgent or emergency type symptoms.   Medication list was reconciled, printed and provided to the patient in AVS. Patient instructions and summary information was reviewed with the patient as documented in the AVS. This note was prepared with assistance of Dragon voice  recognition software. Occasional wrong-word or sound-a-like substitutions may have occurred due to the inherent limitations of voice recognition software

## 2018-04-08 NOTE — Patient Instructions (Signed)
Please follow up if symptoms do not improve or as needed.   Use the cream as directed

## 2018-04-09 LAB — FOLATE RBC: RBC Folate: 752 ng/mL RBC (ref >280)

## 2018-04-19 ENCOUNTER — Ambulatory Visit
Admission: RE | Admit: 2018-04-19 | Discharge: 2018-04-19 | Disposition: A | Discharge status: Home / Self Care | Payer: 59 | Source: Ambulatory Visit | Attending: Family Medicine | Admitting: Family Medicine

## 2018-04-19 ENCOUNTER — Other Ambulatory Visit: Payer: Self-pay

## 2018-04-19 DIAGNOSIS — Z1231 Encounter for screening mammogram for malignant neoplasm of breast: Secondary | ICD-10-CM

## 2018-04-27 ENCOUNTER — Encounter: Payer: Self-pay | Admitting: Family Medicine

## 2018-04-27 DIAGNOSIS — K9089 Other intestinal malabsorption: Secondary | ICD-10-CM

## 2018-04-27 DIAGNOSIS — D72821 Monocytosis (symptomatic): Secondary | ICD-10-CM

## 2018-04-27 DIAGNOSIS — K900 Celiac disease: Secondary | ICD-10-CM

## 2018-04-27 DIAGNOSIS — D509 Iron deficiency anemia, unspecified: Secondary | ICD-10-CM

## 2018-04-27 MED ORDER — ESTRADIOL 0.05 MG/24HR TD PTTW: 1.0000 | MEDICATED_PATCH | TRANSDERMAL | 1 refills | Status: DC

## 2018-04-28 MED FILL — DOTTI 0.05 MG/24HR PTTW: 0.05 | 84 days supply | Qty: 24 | Fill #0

## 2018-04-28 MED FILL — LEVOTHYROXINE 112 MCG TAB: 112 | 90 days supply | Qty: 90 | Fill #0

## 2018-04-30 ENCOUNTER — Other Ambulatory Visit: Payer: Self-pay | Admitting: Family Medicine

## 2018-05-15 MED FILL — DEXILANT DR 60 MG CAPSULE: 60 | 90 days supply | Qty: 90 | Fill #0

## 2018-05-30 MED FILL — RESTASIS 0.05% EYE EMULSION: 0.05 | 90 days supply | Qty: 180 | Fill #0

## 2018-06-20 ENCOUNTER — Other Ambulatory Visit: Payer: Self-pay | Admitting: *Deleted

## 2018-06-20 MED ORDER — CLONAZEPAM 0.5 MG PO TABS: 0.2500 mg | ORAL_TABLET | Freq: Every day | ORAL | 1 refills | Status: DC

## 2018-06-22 ENCOUNTER — Encounter: Payer: Self-pay | Admitting: Family Medicine

## 2018-06-22 ENCOUNTER — Other Ambulatory Visit: Payer: Self-pay

## 2018-06-22 MED ORDER — SUMATRIPTAN SUCCINATE 50 MG PO TABS: 50.0000 mg | ORAL_TABLET | ORAL | 0 refills | Status: DC | PRN

## 2018-06-22 MED FILL — SUMATRIPTAN SUCC 50 MG TAB: 50 | 30 days supply | Qty: 10 | Fill #0

## 2018-06-22 NOTE — Progress Notes (Signed)
Rx for Imitrex sent in to [place]

## 2018-07-12 MED FILL — clonazePAM 0.5 MG TABS: 0.5 | 60 days supply | Qty: 30 | Fill #0

## 2018-08-09 ENCOUNTER — Ambulatory Visit: Payer: 59

## 2018-08-16 ENCOUNTER — Encounter: Payer: Self-pay | Admitting: Family Medicine

## 2018-08-16 ENCOUNTER — Other Ambulatory Visit: Payer: Self-pay

## 2018-08-16 ENCOUNTER — Ambulatory Visit: Payer: 59 | Admitting: Family Medicine

## 2018-08-16 VITALS — BP 106/60 | HR 70 | Temp 98.1°F | Resp 14 | Ht 66.0 in | Wt 97.2 lb

## 2018-08-16 DIAGNOSIS — M7711 Lateral epicondylitis, right elbow: Secondary | ICD-10-CM

## 2018-08-16 DIAGNOSIS — Z23 Encounter for immunization: Secondary | ICD-10-CM | POA: Diagnosis not present

## 2018-08-16 DIAGNOSIS — R202 Paresthesia of skin: Secondary | ICD-10-CM | POA: Diagnosis not present

## 2018-08-16 MED ORDER — TRIAMCINOLONE ACETONIDE 40 MG/ML IJ SUSP
20.0000 mg | Freq: Once | INTRAMUSCULAR | Status: AC
  Administered 2018-08-16: 20 mg via INTRA_ARTICULAR

## 2018-08-16 MED ORDER — GABAPENTIN 100 MG PO CAPS: 100.0000 mg | ORAL_CAPSULE | Freq: Three times a day (TID) | ORAL | 3 refills | Status: DC

## 2018-08-16 MED FILL — GABAPENTIN 100 MG CAPSULE: 100 | 10 days supply | Qty: 90 | Fill #0

## 2018-08-16 NOTE — Patient Instructions (Addendum)
Please return in 6 months for your annual complete physical; please come fasting.  Today you were given your 2nd of 2 Shingrix vaccination.   Start the gabapentin as we discussed and increase the dose as tolerated. Let me know if it is not helpful.  If you have any questions or concerns, please don't hesitate to send me a message via MyChart or call the office at [phone]. Thank you for visiting with [place] today! It's our pleasure caring for you.   Peripheral Neuropathy Peripheral neuropathy is a type of nerve damage. It affects nerves that carry signals between the spinal cord and the arms, legs, and the rest of the body (peripheral nerves). It does not affect nerves in the spinal cord or brain. In peripheral neuropathy, one nerve or a group of nerves may be damaged. Peripheral neuropathy is a broad category that includes many specific nerve disorders, like diabetic neuropathy, hereditary neuropathy, and carpal tunnel syndrome. What are the causes? This condition may be caused by:  Diabetes. This is the most common cause of peripheral neuropathy.  Nerve injury.  Pressure or stress on a nerve that lasts a long time.  Lack (deficiency) of B vitamins. This can result from alcoholism, poor diet, or a restricted diet.  Infections.  Autoimmune diseases, such as rheumatoid arthritis and systemic lupus erythematosus.  Nerve diseases that are passed from parent to child (inherited).  Some medicines, such as cancer medicines (chemotherapy).  Poisonous (toxic) substances, such as lead and mercury.  Too little blood flowing to the legs.  Kidney disease.  Thyroid disease. In some cases, the cause of this condition is not known. What are the signs or symptoms? Symptoms of this condition depend on which of your nerves is damaged. Common symptoms include:  Loss of feeling (numbness) in the feet, hands, or both.  Tingling in the feet, hands, or both.  Burning pain.  Very sensitive  skin.  Weakness.  Not being able to move a part of the body (paralysis).  Muscle twitching.  Clumsiness or poor coordination.  Loss of balance.  Not being able to control your bladder.  Feeling dizzy.  Sexual problems. How is this diagnosed? Diagnosing and finding the cause of peripheral neuropathy can be difficult. Your health care provider will take your medical history and do a physical exam. A neurological exam will also be done. This involves checking things that are affected by your brain, spinal cord, and nerves (nervous system). For example, your health care provider will check your reflexes, how you move, and what you can feel. You may have other tests, such as:  Blood tests.  Electromyogram (EMG) and nerve conduction tests. These tests check nerve function and how well the nerves are controlling the muscles.  Imaging tests, such as CT scans or MRI to rule out other causes of your symptoms.  Removing a small piece of nerve to be examined in a lab (nerve biopsy). This is rare.  Removing and examining a small amount of the fluid that surrounds the brain and spinal cord (lumbar puncture). This is rare. How is this treated? Treatment for this condition may involve:  Treating the underlying cause of the neuropathy, such as diabetes, kidney disease, or vitamin deficiencies.  Stopping medicines that can cause neuropathy, such as chemotherapy.  Medicine to relieve pain. Medicines may include: ? Prescription or over-the-counter pain medicine. ? Antiseizure medicine. ? Antidepressants. ? Pain-relieving patches that are applied to painful areas of skin.  Surgery to relieve pressure on a  nerve or to destroy a nerve that is causing pain.  Physical therapy to help improve movement and balance.  Devices to help you move around (assistive devices). Follow these instructions at home: Medicines  Take over-the-counter and prescription medicines only as told by your health  care provider. Do not take any other medicines without first asking your health care provider.  Do not drive or use heavy machinery while taking prescription pain medicine. Lifestyle   Do not use any products that contain nicotine or tobacco, such as cigarettes and e-cigarettes. Smoking keeps blood from reaching damaged nerves. If you need help quitting, ask your health care provider.  Avoid or limit alcohol. Too much alcohol can cause a vitamin B deficiency, and vitamin B is needed for healthy nerves.  Eat a healthy diet. This includes: ? Eating foods that are high in fiber, such as fresh fruits and vegetables, whole grains, and beans. ? Limiting foods that are high in fat and processed sugars, such as fried or sweet foods. General instructions   If you have diabetes, work closely with your health care provider to keep your blood sugar under control.  If you have numbness in your feet: ? Check every day for signs of injury or infection. Watch for redness, warmth, and swelling. ? Wear padded socks and comfortable shoes. These help protect your feet.  Develop a good support system. Living with peripheral neuropathy can be stressful. Consider talking with a mental health specialist or joining a support group.  Use assistive devices and attend physical therapy as told by your health care provider. This may include using a walker or a cane.  Keep all follow-up visits as told by your health care provider. This is important. Contact a health care provider if:  You have new signs or symptoms of peripheral neuropathy.  You are struggling emotionally from dealing with peripheral neuropathy.  Your pain is not well-controlled. Get help right away if:  You have an injury or infection that is not healing normally.  You develop new weakness in an arm or leg.  You fall frequently. Summary  Peripheral neuropathy is when the nerves in the arms, or legs are damaged, resulting in numbness,  weakness, or pain.  There are many causes of peripheral neuropathy, including diabetes, pinched nerves, vitamin deficiencies, autoimmune disease, and hereditary conditions.  Diagnosing and finding the cause of peripheral neuropathy can be difficult. Your health care provider will take your medical history, do a physical exam, and do tests, including blood tests and nerve function tests.  Treatment involves treating the underlying cause of the neuropathy and taking medicines to help control pain. Physical therapy and assistive devices may also help. This information is not intended to replace advice given to you by your health care provider. Make sure you discuss any questions you have with your health care provider. Document Released: 01/09/2002 Document Revised: 01/01/2017 Document Reviewed: 03/30/2016 Elsevier Patient Education  2020 [employer].

## 2018-08-16 NOTE — Progress Notes (Signed)
Subjective  CC:  Chief Complaint  Patient presents with  . Paresthesia of both feet    Still having numbness and coldness feeling with pain  . Elbow Pain    Right elbow on going, wants to get injection    HPI: Marilyn Collins is a 63 y.o. female who presents to the office today to address the problems listed above in the chief complaint.  Paresthesia of bilateral feet persist and are worsening. Now with pain and numbness to mid foot especially after long work shift. No color changes. Has chronic cold feet and hands. + pins and needles. Had nl lab eval back in January.   Recurrent lateral epicondylitis: paining again. Had steroid injection in January and did very well with that. Had good relief up until about 3-4 weeks ago. No new sxs.   OA hands - uses prn advil.   Assessment  1. Paresthesia of both feet   2. Lateral epicondylitis of right elbow      Plan   pareshesia:  C/w peripheral neuropathy: trial of gabapentin. Discussed side effects and titration up.  S/p steroid injection of elbow. Routine postprocedure instructions.   Follow up: Return in about 6 months (around 02/16/2019) for complete physical.  Visit date not found  Orders Placed This Encounter  Procedures  . Varicella-zoster vaccine IM (Shingrix)   No orders of the defined types were placed in this encounter.     I reviewed the patients updated PMH, FH, and SocHx.    Patient Active Problem List   Diagnosis Date Noted  . Acquired hypothyroidism 02/03/2018    Priority: High  . Hormone replacement therapy (HRT) 02/03/2018    Priority: High  . Chronic right-sided thoracic back pain 08/03/2016    Priority: High  . Celiac disease 07/20/2011    Priority: High  . Dietary iron deficiency - malabsorption 07/20/2011    Priority: High  . Chronic idiopathic monocytosis 07/20/2011    Priority: High  . GERD (gastroesophageal reflux disease)     Priority: Medium  . Iron deficiency anemia 07/20/2011    Priority:  Medium  . Underweight 02/03/2018    Priority: Low  . Postural kyphosis of thoracic region 03/16/2017  . Neck pain 11/20/2016   Current Meds  Medication Sig  . bifidobacterium infantis (ALIGN) capsule Take 1 capsule by mouth daily.  . cholecalciferol (VITAMIN D3) 25 MCG (1000 UT) tablet Take 1,000 Units by mouth daily.  . clonazePAM (KLONOPIN) 0.5 MG tablet Take 0.5 tablets (0.25 mg total) by mouth at bedtime.  Marland Kitchen dexlansoprazole (DEXILANT) 60 MG capsule Take 1 capsule (60 mg total) by mouth daily.  . Diclofenac Sodium (PENNSAID) 2 % SOLN Place 1 application onto the skin 2 (two) times daily.  Marland Kitchen docusate sodium (COLACE) 100 MG capsule Take 100 mg by mouth daily.  Marland Kitchen estradiol (VIVELLE-DOT) 0.05 MG/24HR patch Place 1 patch (0.05 mg total) onto the skin 2 (two) times a week.  . levothyroxine (SYNTHROID, LEVOTHROID) 112 MCG tablet TAKE 1 TABLET BY MOUTH DAILY.  . Magnesium 250 MG TABS Take by mouth.  . Methylcellulose, Laxative, (CITRUCEL PO) Take 1 scoop by mouth daily.  . SUMAtriptan (IMITREX) 50 MG tablet Take 1 tablet (50 mg total) by mouth every 2 (two) hours as needed for migraine. May repeat in 2 hours if headache persists or recurs.  Marland Kitchen tiZANidine (ZANAFLEX) 4 MG capsule Take 1 tablet by mouth at bedtime as needed, or as directed.  . vitamin B-12 (CYANOCOBALAMIN) 1000 MCG tablet  Take 1,000 mcg by mouth daily.  Marland Kitchen zinc gluconate 50 MG tablet Take 50 mg by mouth daily.    Allergies: Patient is allergic to morphine and related and venofer [ferric oxide]. Family History: Patient family history includes Cancer in her maternal grandmother and mother; Heart disease in her father, mother, and paternal grandmother; Hyperlipidemia in her father; Hypertension in her brother and father; Stroke in her father and maternal grandfather. Social History:  Patient  reports that she has never smoked. She has never used smokeless tobacco. She reports that she does not drink alcohol or use drugs.  Review of  Systems: Constitutional: Negative for fever malaise or anorexia Cardiovascular: negative for chest pain Respiratory: negative for SOB or persistent cough Gastrointestinal: negative for abdominal pain  Objective  Vitals: BP 106/60   Pulse 70   Temp 98.1 F (36.7 C) (Oral)   Resp 14   Ht 5' 6"  (1.676 m)   Wt 97 lb 3.2 oz (44.1 kg)   SpO2 98%   BMI 15.69 kg/m  General: no acute distress , A&Ox3 HEENT: PEERL, conjunctiva normal, Oropharynx moist,neck is supple Cardiovascular:  RRR without murmur or gallop.  Respiratory:  Good breath sounds bilaterally, CTAB with normal respiratory effort Skin:  Warm, no rashes Normal feet; nl pedal pulses Right elbow: tender swollen lateral epicondyle; pain with resisted supination  Lab Results  Component Value Date   TSH 3.00 02/03/2018   Nl folate and vit B12 Nl CBC  Lateral epicondylitis Steroid Injection Procedure Note  Pre-operative Diagnosis: right lateral epicondylitis  Post-operative Diagnosis: same  Indications: pain and treatment   Anesthesia:cold spray  Procedure Details   Verbal consent was obtained for the procedure. Universal time out done. The point of maximum tenderness was identified and marked. The skin prepped with alcohol and cold spray was used for anesthesia.  A 1" 25ga needle was advanced into the skin over the lateral epicondyle and proximal ligament and  0.25 ml of triamcinolone (KENALOG) 35m/ml with 0.744mof lidocaine 1% was injected.   Complications:  None; patient tolerated the procedure well.    Commons side effects, risks, benefits, and alternatives for medications and treatment plan prescribed today were discussed, and the patient expressed understanding of the given instructions. Patient is instructed to call or message via MyChart if he/she has any questions or concerns regarding our treatment plan. No barriers to understanding were identified. We discussed Red Flag symptoms and signs in detail. Patient  expressed understanding regarding what to do in case of urgent or emergency type symptoms.   Medication list was reconciled, printed and provided to the patient in AVS. Patient instructions and summary information was reviewed with the patient as documented in the AVS. This note was prepared with assistance of Dragon voice recognition software. Occasional wrong-word or sound-a-like substitutions may have occurred due to the inherent limitations of voice recognition software

## 2018-08-22 ENCOUNTER — Other Ambulatory Visit: Payer: Self-pay | Admitting: *Deleted

## 2018-08-22 DIAGNOSIS — D509 Iron deficiency anemia, unspecified: Secondary | ICD-10-CM

## 2018-08-22 DIAGNOSIS — K9089 Other intestinal malabsorption: Secondary | ICD-10-CM

## 2018-08-22 DIAGNOSIS — D72821 Monocytosis (symptomatic): Secondary | ICD-10-CM

## 2018-08-22 DIAGNOSIS — K900 Celiac disease: Secondary | ICD-10-CM

## 2018-08-22 MED ORDER — ESTRADIOL 0.05 MG/24HR TD PTTW: 1.0000 | MEDICATED_PATCH | TRANSDERMAL | 11 refills | Status: DC

## 2018-08-22 MED FILL — LEVOTHYROXINE 112 MCG TAB: 112 | 90 days supply | Qty: 90 | Fill #0

## 2018-08-22 MED FILL — DOTTI 0.05 MG/24HR PTTW: 0.05 | 84 days supply | Qty: 24 | Fill #0

## 2018-08-23 MED FILL — RESTASIS 0.05% EYE EMULSION: 0.05 | 90 days supply | Qty: 180 | Fill #0

## 2018-08-25 MED FILL — DOTTI 0.05 MG/24HR PTTW: 0.05 | 84 days supply | Qty: 24 | Fill #0

## 2018-09-08 ENCOUNTER — Other Ambulatory Visit: Payer: Self-pay | Admitting: Family Medicine

## 2018-09-08 MED FILL — SUMAtriptan SUCCINATE 50 MG: 50 | 30 days supply | Qty: 10 | Fill #0

## 2018-09-08 MED FILL — GABAPENTIN 100 MG CAPSULE: 100 | 10 days supply | Qty: 90 | Fill #0

## 2018-09-19 ENCOUNTER — Telehealth: Payer: Self-pay | Admitting: Family Medicine

## 2018-09-20 NOTE — Telephone Encounter (Signed)
Pt calling.  States that the pharmacy has told her that a prior authorization is needed before they can release medication to her.

## 2018-09-20 NOTE — Telephone Encounter (Signed)
Pt aware that PA has been submitted and awaiting approval.. A MyChart message will be sent to pt and approva/denial sent to [facility]

## 2018-09-20 NOTE — Telephone Encounter (Signed)
See note

## 2018-09-22 ENCOUNTER — Encounter: Payer: Self-pay | Admitting: *Deleted

## 2018-09-22 MED FILL — DEXILANT DR 60 MG CAPSULE: 60 | 30 days supply | Qty: 30 | Fill #0

## 2018-09-27 ENCOUNTER — Encounter: Payer: Self-pay | Admitting: Family Medicine

## 2018-10-12 MED FILL — GABAPENTIN 100 MG CAPSULE: 100 | 10 days supply | Qty: 90 | Fill #1

## 2018-10-18 DIAGNOSIS — L821 Other seborrheic keratosis: Secondary | ICD-10-CM | POA: Diagnosis not present

## 2018-10-18 DIAGNOSIS — Z85828 Personal history of other malignant neoplasm of skin: Secondary | ICD-10-CM | POA: Diagnosis not present

## 2018-10-18 DIAGNOSIS — L819 Disorder of pigmentation, unspecified: Secondary | ICD-10-CM | POA: Diagnosis not present

## 2018-10-18 DIAGNOSIS — D225 Melanocytic nevi of trunk: Secondary | ICD-10-CM | POA: Diagnosis not present

## 2018-11-07 ENCOUNTER — Other Ambulatory Visit: Payer: Self-pay | Admitting: Family Medicine

## 2018-11-07 MED FILL — LEVOTHYROXINE 112 MCG TAB: 112 | 90 days supply | Qty: 90 | Fill #0

## 2018-11-07 MED FILL — GABAPENTIN 100 MG CAPSULE: 100 | 10 days supply | Qty: 90 | Fill #2

## 2018-11-07 MED FILL — DOTTI 0.05 MG/24HR PTTW: 0.05 | 84 days supply | Qty: 24 | Fill #1

## 2018-11-22 ENCOUNTER — Other Ambulatory Visit: Payer: Self-pay

## 2018-11-22 ENCOUNTER — Ambulatory Visit (INDEPENDENT_AMBULATORY_CARE_PROVIDER_SITE_OTHER): Payer: 59 | Admitting: Physical Medicine and Rehabilitation

## 2018-11-22 ENCOUNTER — Encounter: Payer: Self-pay | Admitting: Physical Medicine and Rehabilitation

## 2018-11-22 VITALS — BP 100/53 | HR 60

## 2018-11-22 DIAGNOSIS — M7918 Myalgia, other site: Secondary | ICD-10-CM

## 2018-11-22 DIAGNOSIS — M545 Low back pain, unspecified: Secondary | ICD-10-CM

## 2018-11-22 DIAGNOSIS — G8929 Other chronic pain: Secondary | ICD-10-CM

## 2018-11-22 DIAGNOSIS — M47816 Spondylosis without myelopathy or radiculopathy, lumbar region: Secondary | ICD-10-CM | POA: Diagnosis not present

## 2018-11-22 DIAGNOSIS — M546 Pain in thoracic spine: Secondary | ICD-10-CM

## 2018-11-22 NOTE — Progress Notes (Signed)
  Numeric Pain Rating Scale and Functional Assessment Average Pain 5   In the last MONTH (on 0-10 scale) has pain interfered with the following?  1. General activity like being  able to carry out your everyday physical activities such as walking, climbing stairs, carrying groceries, or moving a chair?  Rating(1)

## 2018-11-29 MED FILL — clonazePAM 0.5 MG TABS: 0.5 | 60 days supply | Qty: 30 | Fill #1

## 2018-11-30 ENCOUNTER — Other Ambulatory Visit: Payer: Self-pay | Admitting: *Deleted

## 2018-11-30 MED ORDER — GABAPENTIN 100 MG PO CAPS: 100.0000 mg | ORAL_CAPSULE | Freq: Three times a day (TID) | ORAL | 3 refills | Status: DC

## 2018-11-30 MED FILL — GABAPENTIN 100 MG CAPSULE: 100 | 10 days supply | Qty: 90 | Fill #0

## 2018-12-20 MED FILL — DEXILANT DR 60 MG CAPSULE: 60 | 90 days supply | Qty: 90 | Fill #1

## 2018-12-22 ENCOUNTER — Encounter: Payer: Self-pay | Admitting: Physical Medicine and Rehabilitation

## 2018-12-22 DIAGNOSIS — G8929 Other chronic pain: Secondary | ICD-10-CM | POA: Diagnosis not present

## 2018-12-22 DIAGNOSIS — M47816 Spondylosis without myelopathy or radiculopathy, lumbar region: Secondary | ICD-10-CM | POA: Diagnosis not present

## 2018-12-22 DIAGNOSIS — M545 Low back pain: Secondary | ICD-10-CM | POA: Diagnosis not present

## 2018-12-22 DIAGNOSIS — M546 Pain in thoracic spine: Secondary | ICD-10-CM | POA: Diagnosis not present

## 2018-12-22 DIAGNOSIS — M7918 Myalgia, other site: Secondary | ICD-10-CM

## 2018-12-22 MED ORDER — TRIAMCINOLONE ACETONIDE 40 MG/ML IJ SUSP
40.0000 mg | INTRAMUSCULAR | Status: AC | PRN
  Administered 2018-12-22: 40 mg via INTRAMUSCULAR

## 2018-12-22 MED ORDER — LIDOCAINE HCL 1 % IJ SOLN
3.0000 mL | INTRAMUSCULAR | Status: AC | PRN
  Administered 2018-12-22: 06:00:00 3 mL

## 2018-12-22 NOTE — Progress Notes (Signed)
Marilyn Collins - 63 y.o. female MRN [id number]  Date of birth: Jul 19, 1955  Office Visit Note: Visit Date: 11/22/2018 PCP: Leamon Arnt, MD Referred by: Leamon Arnt, MD  Subjective: Chief Complaint  Patient presents with  . Lower Back - Pain   HPI:  Marilyn Collins is a 63 y.o. female who comes in today For evaluation management of worsening low back pain felt to be myofascial pain.  Patient has a history of physical therapy with dry needling.  She is to also see Dr. Teresa Coombs for osteopathic manipulation.  She was originally seen in our office at the request of Dr. Briscoe Deutscher.  She is having thoracic pain she status post epidural injection with some relief.  She is a very thin individual she does have multiple areas of trigger points along the paraspinal region of the thoracic and lumbar spine particularly the quadratus lumborum of the lower spine today.  Tie-in of the latissimus dorsi.  Trigger point injections were provided today and we did talk about regrouping with physical therapy for dry needling.  She can continue to work on core strengthening.  We will see her back as needed.  Review of Systems  All other systems reviewed and are negative.  Otherwise per HPI.  Assessment & Plan: Visit Diagnoses:  1. Chronic bilateral low back pain without sciatica   2. Spondylosis without myelopathy or radiculopathy, lumbar region   3. Myofascial pain syndrome   4. Chronic midline thoracic back pain     Plan: No additional findings.   Meds & Orders: No orders of the defined types were placed in this encounter.   Orders Placed This Encounter  Procedures  . Trigger Point Inj    Follow-up: No follow-ups on file.   Procedures: Trigger Point Inj  Date/Time: 12/22/2018 5:37 AM Performed by: Magnus Sinning, MD Authorized by: Magnus Sinning, MD   Consent Given by:  Patient Site marked: the procedure site was marked   Timeout: prior to procedure the correct patient, procedure,  and site was verified   Indications:  Pain Total # of Trigger Points:  3 or more Location: back   Needle Size:  25 G Approach:  Dorsal and lateral Medications #1:  3 mL lidocaine 1 %; 40 mg triamcinolone acetonide 40 MG/ML Patient tolerance:  Patient tolerated the procedure well with no immediate complications Comments: Trigger points palpated in the paraspinal musculature multifidus as well as latissimus dorsi and quadratus lumborum.  Needling technique was utilized    No notes on file   Clinical History: MRI THORACIC SPINE WITHOUT CONTRAST  TECHNIQUE: Multiplanar, multisequence MR imaging of the thoracic spine was performed. No intravenous contrast was administered.  COMPARISON:  Thoracic spine MRI 05/04/2005.  FINDINGS: Limited cervical spine imaging: Partially visible C6-C7 disc space loss and disc herniation on series 4, image 6.  Thoracic spine segmentation: Appears normal. The same numbering system is used today as on the 2007 MRI.  Alignment: Stable thoracic vertebral height and alignment since 2007. Mild straightening of lower thoracic kyphosis.  Vertebrae: Several benign chronic vertebral body hemangiomas in the lower thoracic spine are stable since 2007, most notable at T11 and T12. Underlying bone marrow signal remains within normal limits. Mild chronic endplate irregularity inferiorly at T10, and at T11-T12 is stable since 2007. No marrow edema or evidence of acute osseous abnormality.  Cord: Spinal cord signal is within normal limits at all visualized levels. Capacious thoracic spinal canal. Conus medullaris occurs below the  T12-L1 level and is not visible.  Paraspinal and other soft tissues: Negative visualized thoracic inlet and thoracic viscera. Negative visualized posterior paraspinal soft tissues.  Disc levels:  T1-T2: Negative.  T2-T3: Mild facet hypertrophy.  No stenosis.  T3-T4: Borderline to mild facet hypertrophy.  No stenosis.   T4-T5: Negative.  T5-T6: Negative.  T6-T7: Subtle central disc protrusion is new since 2007 but only faintly visible (series 4, image 7). No stenosis.  T7-T8: Small lobulated left greater than right paracentral disc protrusions are new or increased since 2007, best seen on series 8, image 23. No associated stenosis.  T8-T9: Subtle if any left paracentral disc protrusion.  No stenosis.  T9-T10: Mild facet hypertrophy.  No stenosis.  T10-T11: Minimal disc bulge. Mild facet and ligament flavum hypertrophy. No stenosis.  T11-T12: Minimal disc bulge.  No stenosis.  T12-L1: Negative.  IMPRESSION: 1. Largely stable MRI appearance of the thoracic spine since 2007. No acute or interval osseous abnormality. 2. Mild for age thoracic spine degeneration and capacious underlying thoracic spinal canal. Subtle disc protrusions from T6-T7 to T8-T9 are new or have increased since 2007, but results in no spinal stenosis or neural impingement.   Electronically Signed   By: Genevie Ann M.D.   On: 01/28/2017 15:25     Objective:  VS:  HT:    WT:   BMI:     BP:(!) 100/53  HR:60bpm  TEMP: ( )  RESP:  Physical Exam Constitutional:      General: She is not in acute distress.    Appearance: Normal appearance. She is not ill-appearing.  HENT:     Head: Normocephalic and atraumatic.     Right Ear: External ear normal.     Left Ear: External ear normal.  Eyes:     Extraocular Movements: Extraocular movements intact.  Cardiovascular:     Rate and Rhythm: Normal rate.     Pulses: Normal pulses.  Musculoskeletal:     Right lower leg: No edema.     Left lower leg: No edema.     Comments: Patient has good distal strength with no pain over the greater trochanters.  No clonus or focal weakness.  Active trigger points in the thoracic and lumbar region particular in the paraspinal multifidus and quadratus lumborum and latissimus dorsi.  Skin:    Findings: No erythema, lesion or  rash.  Neurological:     General: No focal deficit present.     Mental Status: She is alert and oriented to person, place, and time.     Sensory: No sensory deficit.     Motor: No weakness or abnormal muscle tone.     Coordination: Coordination normal.  Psychiatric:        Mood and Affect: Mood normal.        Behavior: Behavior normal.     Ortho Exam Imaging: No results found.

## 2019-01-17 ENCOUNTER — Encounter: Payer: Self-pay | Admitting: Family Medicine

## 2019-01-23 MED FILL — GABAPENTIN 100 MG CAPSULE: 100 | 10 days supply | Qty: 90 | Fill #2

## 2019-02-07 ENCOUNTER — Other Ambulatory Visit: Payer: Self-pay

## 2019-02-07 ENCOUNTER — Encounter: Payer: Self-pay | Admitting: Family Medicine

## 2019-02-07 ENCOUNTER — Ambulatory Visit (INDEPENDENT_AMBULATORY_CARE_PROVIDER_SITE_OTHER): Payer: 59 | Admitting: Family Medicine

## 2019-02-07 VITALS — BP 105/73 | HR 66 | Temp 97.5°F | Ht 66.0 in | Wt 99.2 lb

## 2019-02-07 DIAGNOSIS — R202 Paresthesia of skin: Secondary | ICD-10-CM

## 2019-02-07 DIAGNOSIS — G609 Hereditary and idiopathic neuropathy, unspecified: Secondary | ICD-10-CM

## 2019-02-07 DIAGNOSIS — Z8639 Personal history of other endocrine, nutritional and metabolic disease: Secondary | ICD-10-CM

## 2019-02-07 LAB — CBC WITH DIFFERENTIAL/PLATELET
Basophils Absolute: 0.1 10*3/uL (ref 0.0–0.1)
Basophils Relative: 0.9 % (ref 0.0–3.0)
Eosinophils Absolute: 0.1 10*3/uL (ref 0.0–0.7)
Eosinophils Relative: 1.2 % (ref 0.0–5.0)
HCT: 38.3 % (ref 36.0–46.0)
Hemoglobin: 12.6 g/dL (ref 12.0–15.0)
Lymphocytes Relative: 26.4 % (ref 12.0–46.0)
Lymphs Abs: 2 10*3/uL (ref 0.7–4.0)
MCHC: 32.9 g/dL (ref 30.0–36.0)
MCV: 96.3 fl (ref 78.0–100.0)
Monocytes Absolute: 0.6 10*3/uL (ref 0.1–1.0)
Monocytes Relative: 8.5 % (ref 3.0–12.0)
Neutro Abs: 4.8 10*3/uL (ref 1.4–7.7)
Neutrophils Relative %: 63 % (ref 43.0–77.0)
Platelets: 246 10*3/uL (ref 150.0–400.0)
RBC: 3.98 Mil/uL (ref 3.87–5.11)
RDW: 13.9 % (ref 11.5–15.5)
WBC: 7.6 10*3/uL (ref 4.0–10.5)

## 2019-02-07 MED FILL — LEVOTHYROXINE SODIUM 112 MC: 112 | 90 days supply | Qty: 90 | Fill #1

## 2019-02-07 NOTE — Progress Notes (Signed)
Subjective  CC:  Chief Complaint  Patient presents with  . achiness in b/l legs    patient c/o achiness in b/l legs from knees down.  Feels that this happens when her iron levels are low    HPI: Marilyn Collins is a 64 y.o. female who presents to the office today to address the problems listed above in the chief complaint.  64 yo w/ celiac disease and h/o iron deficiency due to malabsorption last requiring iron infusion in 2015. Was put on gabapentin last year for presumed peripheral neuropathy and reports that she no longer has the pins and needles sensation in her feet although it makes her a little sleeper: she is taking 100 bid and 243m at night. Tolerating that dose ok.   However recent bout with "discomfort" in lower legs again. We have discussed this before. Ive reviewed notes from both encounters. Thinks cold weather may be playing a role. No color discoloration; feet are cold and uncomfortable at night:sleeps with electric warmers. No swelling or joint pain. Has occ "strange" sensation. Legs feel heavy. labwork early 2019 reviewed and was all normal.    Assessment  1. Paresthesia of both feet   2. History of iron deficiency   3. Idiopathic peripheral neuropathy      Plan   neuropathy:  Symptoms most c/w periph neuropathy. Improving with gabapentin. Titrate dose up at night and monitor for improvement.   Recheck iron levels to be certain not low again. Doubt related to symptoms   Follow up: No follow-ups on file.  05/09/2019  Orders Placed This Encounter  Procedures  . CBC w/Diff  . Iron, TIBC Ferritin   No orders of the defined types were placed in this encounter.     I reviewed the patients updated PMH, FH, and SocHx.    Patient Active Problem List   Diagnosis Date Noted  . Acquired hypothyroidism 02/03/2018    Priority: High  . Hormone replacement therapy (HRT) 02/03/2018    Priority: High  . Chronic right-sided thoracic back pain 08/03/2016    Priority: High   . Celiac disease 07/20/2011    Priority: High  . Dietary iron deficiency - malabsorption 07/20/2011    Priority: High  . Chronic idiopathic monocytosis 07/20/2011    Priority: High  . GERD (gastroesophageal reflux disease)     Priority: Medium  . Underweight 02/03/2018    Priority: Low  . Idiopathic peripheral neuropathy 02/07/2019  . Postural kyphosis of thoracic region 03/16/2017  . Neck pain 11/20/2016   Current Meds  Medication Sig  . bifidobacterium infantis (ALIGN) capsule Take 1 capsule by mouth daily.  . cholecalciferol (VITAMIN D3) 25 MCG (1000 UT) tablet Take 1,000 Units by mouth daily.  . clonazePAM (KLONOPIN) 0.5 MG tablet Take 0.5 tablets (0.25 mg total) by mouth at bedtime.  .Marland KitchenDEXILANT 60 MG capsule TAKE 1 CAPSULE BY MOUTH DAILY.  . Diclofenac Sodium (PENNSAID) 2 % SOLN Place 1 application onto the skin 2 (two) times daily.  .Marland Kitchendocusate sodium (COLACE) 100 MG capsule Take 100 mg by mouth daily.  .Marland Kitchenestradiol (VIVELLE-DOT) 0.05 MG/24HR patch Place 1 patch (0.05 mg total) onto the skin 2 (two) times a week.  . gabapentin (NEURONTIN) 100 MG capsule Take 1-3 capsules (100-300 mg total) by mouth 3 (three) times daily.  .Marland Kitchenlevothyroxine (SYNTHROID) 112 MCG tablet TAKE 1 TABLET BY MOUTH DAILY.  .Marland KitchenMethylcellulose, Laxative, (CITRUCEL PO) Take 1 scoop by mouth daily.  . RESTASIS 0.05 %  ophthalmic emulsion   . SUMAtriptan (IMITREX) 50 MG tablet TAKE 1 TABLET BY MOUTH AT ONSET OF MIGRAINE AS NEEDED, MAY REPEAT IN 2 HOURS IF HEADACHE PERSISTS OR RECURS  . TURMERIC CURCUMIN PO Take by mouth.  . vitamin B-12 (CYANOCOBALAMIN) 1000 MCG tablet Take 1,000 mcg by mouth daily.  Marland Kitchen zinc gluconate 50 MG tablet Take 50 mg by mouth daily.    Allergies: Patient is allergic to morphine and related and venofer [ferric oxide]. Family History: Patient family history includes Cancer in her maternal grandmother and mother; Heart disease in her father, mother, and paternal grandmother; Hyperlipidemia  in her father; Hypertension in her brother and father; Stroke in her father and maternal grandfather. Social History:  Patient  reports that she has never smoked. She has never used smokeless tobacco. She reports that she does not drink alcohol or use drugs.  Review of Systems: Constitutional: Negative for fever malaise or anorexia Cardiovascular: negative for chest pain Respiratory: negative for SOB or persistent cough Gastrointestinal: negative for abdominal pain  Objective  Vitals: BP 105/73 (BP Location: Left Arm, Patient Position: Sitting, Cuff Size: Normal)   Pulse 66   Temp (!) 97.5 F (36.4 C) (Skin)   Ht 5' 6"  (1.676 m)   Wt 99 lb 3.2 oz (45 kg)   SpO2 100%   BMI 16.01 kg/m  General: no acute distress , A&Ox3 EXT: normal nail beds, no edema. Nl sensation to light touch. Skin:  Warm, no rashes  Lab Results  Component Value Date   WBC 7.6 02/07/2019   HGB 12.6 02/07/2019   HCT 38.3 02/07/2019   MCV 96.3 02/07/2019   PLT 246.0 02/07/2019   Lab Results  Component Value Date   FERRITIN 59 02/03/2018      Commons side effects, risks, benefits, and alternatives for medications and treatment plan prescribed today were discussed, and the patient expressed understanding of the given instructions. Patient is instructed to call or message via MyChart if he/she has any questions or concerns regarding our treatment plan. No barriers to understanding were identified. We discussed Red Flag symptoms and signs in detail. Patient expressed understanding regarding what to do in case of urgent or emergency type symptoms.   Medication list was reconciled, printed and provided to the patient in AVS. Patient instructions and summary information was reviewed with the patient as documented in the AVS. This note was prepared with assistance of Dragon voice recognition software. Occasional wrong-word or sound-a-like substitutions may have occurred due to the inherent limitations of voice  recognition software  This visit occurred during the SARS-CoV-2 public health emergency.  Safety protocols were in place, including screening questions prior to the visit, additional usage of staff PPE, and extensive cleaning of exam room while observing appropriate contact time as indicated for disinfecting solutions.

## 2019-02-07 NOTE — Patient Instructions (Signed)
Please follow up as scheduled for your next visit with me: 05/09/2019   Try increasing your gabapentin to 300-471m at night. Let me know how this does for you.  If you have any questions or concerns, please don't hesitate to send me a message via MyChart or call the office at [phone] Thank you for visiting with [email or web address]! It's our pleasure caring for you.   Peripheral Neuropathy Peripheral neuropathy is a type of nerve damage. It affects nerves that carry signals between the spinal cord and the arms, legs, and the rest of the body (peripheral nerves). It does not affect nerves in the spinal cord or brain. In peripheral neuropathy, one nerve or a group of nerves may be damaged. Peripheral neuropathy is a broad category that includes many specific nerve disorders, like diabetic neuropathy, hereditary neuropathy, and carpal tunnel syndrome. What are the causes? This condition may be caused by:  Diabetes. This is the most common cause of peripheral neuropathy.  Nerve injury.  Pressure or stress on a nerve that lasts a long time.  Lack (deficiency) of B vitamins. This can result from alcoholism, poor diet, or a restricted diet.  Infections.  Autoimmune diseases, such as rheumatoid arthritis and systemic lupus erythematosus.  Nerve diseases that are passed from parent to child (inherited).  Some medicines, such as cancer medicines (chemotherapy).  Poisonous (toxic) substances, such as lead and mercury.  Too little blood flowing to the legs.  Kidney disease.  Thyroid disease. In some cases, the cause of this condition is not known. What are the signs or symptoms? Symptoms of this condition depend on which of your nerves is damaged. Common symptoms include:  Loss of feeling (numbness) in the feet, hands, or both.  Tingling in the feet, hands, or both.  Burning pain.  Very sensitive skin.  Weakness.  Not being able to move a part of the body (paralysis).  Muscle  twitching.  Clumsiness or poor coordination.  Loss of balance.  Not being able to control your bladder.  Feeling dizzy.  Sexual problems. How is this diagnosed? Diagnosing and finding the cause of peripheral neuropathy can be difficult. Your health care provider will take your medical history and do a physical exam. A neurological exam will also be done. This involves checking things that are affected by your brain, spinal cord, and nerves (nervous system). For example, your health care provider will check your reflexes, how you move, and what you can feel. You may have other tests, such as:  Blood tests.  Electromyogram (EMG) and nerve conduction tests. These tests check nerve function and how well the nerves are controlling the muscles.  Imaging tests, such as CT scans or MRI to rule out other causes of your symptoms.  Removing a small piece of nerve to be examined in a lab (nerve biopsy). This is rare.  Removing and examining a small amount of the fluid that surrounds the brain and spinal cord (lumbar puncture). This is rare. How is this treated? Treatment for this condition may involve:  Treating the underlying cause of the neuropathy, such as diabetes, kidney disease, or vitamin deficiencies.  Stopping medicines that can cause neuropathy, such as chemotherapy.  Medicine to relieve pain. Medicines may include: ? Prescription or over-the-counter pain medicine. ? Antiseizure medicine. ? Antidepressants. ? Pain-relieving patches that are applied to painful areas of skin.  Surgery to relieve pressure on a nerve or to destroy a nerve that is causing pain.  Physical therapy to help improve  movement and balance.  Devices to help you move around (assistive devices). Follow these instructions at home: Medicines  Take over-the-counter and prescription medicines only as told by your health care provider. Do not take any other medicines without first asking your health care  provider.  Do not drive or use heavy machinery while taking prescription pain medicine. Lifestyle   Do not use any products that contain nicotine or tobacco, such as cigarettes and e-cigarettes. Smoking keeps blood from reaching damaged nerves. If you need help quitting, ask your health care provider.  Avoid or limit alcohol. Too much alcohol can cause a vitamin B deficiency, and vitamin B is needed for healthy nerves.  Eat a healthy diet. This includes: ? Eating foods that are high in fiber, such as fresh fruits and vegetables, whole grains, and beans. ? Limiting foods that are high in fat and processed sugars, such as fried or sweet foods. General instructions   If you have diabetes, work closely with your health care provider to keep your blood sugar under control.  If you have numbness in your feet: ? Check every day for signs of injury or infection. Watch for redness, warmth, and swelling. ? Wear padded socks and comfortable shoes. These help protect your feet.  Develop a good support system. Living with peripheral neuropathy can be stressful. Consider talking with a mental health specialist or joining a support group.  Use assistive devices and attend physical therapy as told by your health care provider. This may include using a walker or a cane.  Keep all follow-up visits as told by your health care provider. This is important. Contact a health care provider if:  You have new signs or symptoms of peripheral neuropathy.  You are struggling emotionally from dealing with peripheral neuropathy.  Your pain is not well-controlled. Get help right away if:  You have an injury or infection that is not healing normally.  You develop new weakness in an arm or leg.  You fall frequently. Summary  Peripheral neuropathy is when the nerves in the arms, or legs are damaged, resulting in numbness, weakness, or pain.  There are many causes of peripheral neuropathy, including  diabetes, pinched nerves, vitamin deficiencies, autoimmune disease, and hereditary conditions.  Diagnosing and finding the cause of peripheral neuropathy can be difficult. Your health care provider will take your medical history, do a physical exam, and do tests, including blood tests and nerve function tests.  Treatment involves treating the underlying cause of the neuropathy and taking medicines to help control pain. Physical therapy and assistive devices may also help. This information is not intended to replace advice given to you by your health care provider. Make sure you discuss any questions you have with your health care provider. Document Revised: 01/01/2017 Document Reviewed: 03/30/2016 Elsevier Patient Education  2020 [employer].

## 2019-02-08 LAB — IRON,TIBC AND FERRITIN PANEL
%SAT: 38 % (calc) (ref 16–45)
Ferritin: 42 ng/mL (ref 16–288)
Iron: 97 ug/dL (ref 45–160)
TIBC: 253 mcg/dL (calc) (ref 250–450)

## 2019-02-09 ENCOUNTER — Other Ambulatory Visit: Payer: Self-pay

## 2019-02-09 MED ORDER — LEVOTHYROXINE SODIUM 112 MCG PO TABS: 112.0000 ug | ORAL_TABLET | Freq: Every day | ORAL | 3 refills | Status: DC

## 2019-02-21 MED FILL — GABAPENTIN 100 MG CAPSULE: 100 | 10 days supply | Qty: 90 | Fill #3

## 2019-02-21 MED FILL — DOTTI 0.05 MG/24HR PTTW: 0.05 | 84 days supply | Qty: 24 | Fill #2

## 2019-02-28 ENCOUNTER — Other Ambulatory Visit (HOSPITAL_COMMUNITY): Payer: Self-pay | Admitting: *Deleted

## 2019-02-28 DIAGNOSIS — H16223 Keratoconjunctivitis sicca, not specified as Sjogren's, bilateral: Secondary | ICD-10-CM | POA: Diagnosis not present

## 2019-02-28 DIAGNOSIS — H2513 Age-related nuclear cataract, bilateral: Secondary | ICD-10-CM | POA: Diagnosis not present

## 2019-03-02 MED FILL — CEQUA 0.09 % SOLN: 0.09 | 90 days supply | Qty: 180 | Fill #0

## 2019-03-07 MED FILL — DEXILANT DR 60 MG CAPSULE: 60 | 90 days supply | Qty: 90 | Fill #2

## 2019-03-29 ENCOUNTER — Other Ambulatory Visit: Payer: Self-pay | Admitting: Family Medicine

## 2019-03-30 ENCOUNTER — Telehealth: Payer: Self-pay

## 2019-03-30 ENCOUNTER — Other Ambulatory Visit: Payer: Self-pay

## 2019-03-30 ENCOUNTER — Other Ambulatory Visit: Payer: Self-pay | Admitting: Family Medicine

## 2019-03-30 MED ORDER — CLONAZEPAM 0.5 MG PO TABS: 0.2500 mg | ORAL_TABLET | Freq: Every day | ORAL | 1 refills | Status: DC

## 2019-03-30 MED FILL — GABAPENTIN 100 MG CAPSULE: 100 | 10 days supply | Qty: 90 | Fill #0

## 2019-03-30 MED FILL — clonazePAM 0.5 MG TABS: 0.5 | 60 days supply | Qty: 30 | Fill #0

## 2019-03-30 NOTE — Telephone Encounter (Signed)
Pharmacy is calling because they just received an electric fax but they are unable to make out what is on it. Please resend fax.

## 2019-03-30 NOTE — Telephone Encounter (Signed)
Resent fax to [facility]

## 2019-04-06 ENCOUNTER — Telehealth: Payer: Self-pay | Admitting: Radiology

## 2019-04-06 NOTE — Telephone Encounter (Signed)
Patient called triage this morning stating that she has had increased right side back and right leg pain with tingling and heaviness the last 24hrs.  She is on schedule for 04/18/19 for trigger point injections and is requesting to be seen sooner. Patient states she is going to sleep now for the day because she will be working tonight so please leave detailed message on phone.  [phone]

## 2019-04-06 NOTE — Telephone Encounter (Signed)
Called pt to offer her an early appt on 04/11/19, lvm #1.

## 2019-04-07 NOTE — Telephone Encounter (Signed)
Called pt and lvm #2

## 2019-04-10 NOTE — Telephone Encounter (Signed)
Called pt and she states she is having a dental procedure 04/11/19 and will not be able to take that appt. Advised pt that 04/18/19 is the earliest appt we have at the moment.

## 2019-04-11 ENCOUNTER — Ambulatory Visit: Payer: 59 | Admitting: Physical Medicine and Rehabilitation

## 2019-04-11 MED FILL — HYDROCODON-APAP 5-325: 5-325 | 3 days supply | Qty: 14 | Fill #0

## 2019-04-11 MED FILL — traMADol HCL 50 MG TABS: 50 | 3 days supply | Qty: 14 | Fill #0

## 2019-04-11 MED FILL — PENICILLIN VK 500 MG TABLET: 500 | 7 days supply | Qty: 28 | Fill #0

## 2019-04-18 ENCOUNTER — Other Ambulatory Visit: Payer: Self-pay

## 2019-04-18 ENCOUNTER — Ambulatory Visit (INDEPENDENT_AMBULATORY_CARE_PROVIDER_SITE_OTHER): Payer: 59 | Admitting: Physical Medicine and Rehabilitation

## 2019-04-18 ENCOUNTER — Encounter: Payer: Self-pay | Admitting: Physical Medicine and Rehabilitation

## 2019-04-18 VITALS — BP 92/60 | HR 65

## 2019-04-18 DIAGNOSIS — M4004 Postural kyphosis, thoracic region: Secondary | ICD-10-CM | POA: Diagnosis not present

## 2019-04-18 DIAGNOSIS — M519 Unspecified thoracic, thoracolumbar and lumbosacral intervertebral disc disorder: Secondary | ICD-10-CM

## 2019-04-18 DIAGNOSIS — M546 Pain in thoracic spine: Secondary | ICD-10-CM | POA: Diagnosis not present

## 2019-04-18 DIAGNOSIS — G8929 Other chronic pain: Secondary | ICD-10-CM

## 2019-04-18 DIAGNOSIS — M7918 Myalgia, other site: Secondary | ICD-10-CM | POA: Diagnosis not present

## 2019-04-18 DIAGNOSIS — M542 Cervicalgia: Secondary | ICD-10-CM

## 2019-04-18 NOTE — Progress Notes (Signed)
  Numeric Pain Rating Scale and Functional Assessment Average Pain 7 Pain Right Now 2 My pain is intermittent, dull and stabbing Pain is worse with: being in a reclined position. Pain improves with: medication   In the last MONTH (on 0-10 scale) has pain interfered with the following?  1. General activity like being  able to carry out your everyday physical activities such as walking, climbing stairs, carrying groceries, or moving a chair?  Rating(5)  2. Relation with others like being able to carry out your usual social activities and roles such as  activities at home, at work and in your community. Rating(5)  3. Enjoyment of life such that you have  been bothered by emotional problems such as feeling anxious, depressed or irritable?  Rating(8)

## 2019-04-19 ENCOUNTER — Encounter: Payer: Self-pay | Admitting: Physical Medicine and Rehabilitation

## 2019-04-19 DIAGNOSIS — M546 Pain in thoracic spine: Secondary | ICD-10-CM | POA: Diagnosis not present

## 2019-04-19 DIAGNOSIS — M519 Unspecified thoracic, thoracolumbar and lumbosacral intervertebral disc disorder: Secondary | ICD-10-CM | POA: Insufficient documentation

## 2019-04-19 DIAGNOSIS — M542 Cervicalgia: Secondary | ICD-10-CM | POA: Diagnosis not present

## 2019-04-19 DIAGNOSIS — G8929 Other chronic pain: Secondary | ICD-10-CM | POA: Diagnosis not present

## 2019-04-19 DIAGNOSIS — M7918 Myalgia, other site: Secondary | ICD-10-CM

## 2019-04-19 DIAGNOSIS — M4004 Postural kyphosis, thoracic region: Secondary | ICD-10-CM | POA: Diagnosis not present

## 2019-04-19 MED ORDER — TRIAMCINOLONE ACETONIDE 40 MG/ML IJ SUSP
20.0000 mg | INTRAMUSCULAR | Status: AC | PRN
  Administered 2019-04-19: 20 mg via INTRAMUSCULAR

## 2019-04-19 MED ORDER — LIDOCAINE HCL 1 % IJ SOLN
3.0000 mL | INTRAMUSCULAR | Status: AC | PRN
  Administered 2019-04-19: 3 mL

## 2019-04-19 NOTE — Progress Notes (Signed)
Marilyn Collins - 65 y.o. female MRN [id number]  Date of birth: [birth year]  Office Visit Note: Visit Date: 04/18/2019 PCP: Leamon Arnt, MD Referred by: Leamon Arnt, MD  Subjective: Chief Complaint  Patient presents with  . Middle Back - Pain  . Neck - Pain   HPI: Marilyn Collins is a 64 y.o. female who comes in today For evaluation management of worsening mid thoracic back pain felt to be myofascial pain as well as left neck pain.  Patient has a history of physical therapy with dry needling.  She used to also see Dr. Teresa Coombs for osteopathic manipulation.  She was originally seen in our office at the request of Dr. Briscoe Deutscher.  We completed thoracic epidural injection with some relief at the time.  She was really found to have more of an issue with considerable my fascial pain with trigger points and taut bands particularly paraspinally and around the rhomboid and latissimus dorsi.  She also had this in the levator scapula particularly always on the right side.  She comes in today with worsening symptoms in the right mid back region probably about T10.  She rates her average pain is a 7 out of 10 its intermittent dull and stabbing and has been doing somewhat better with ibuprofen.  She reports worsening with reclining position better with some medication.  She reports since last time the left upper back pain has been tremendously reduced after trigger point injection but she still gets a lot of neck pain on the left side with rotation more in the scalene musculature is particularly in the upper cervical region.  She denies any new trauma or focal weakness.  She has had no other issues really ongoing.  Review of Systems  Musculoskeletal: Positive for back pain and neck pain.  All other systems reviewed and are negative.  Otherwise per HPI.  Assessment & Plan: Visit Diagnoses:  1. Pain in thoracic spine   2. Chronic right-sided thoracic back pain   3. Thoracic disc disease   4.  Myofascial pain syndrome   5. Cervicalgia   6. Postural kyphosis of thoracic region     Plan: Findings:  1.  Chronic long-term history of thoracic back pain which is a combination of digit narrative disc issue as well as myofascial pain syndrome.  Today she clearly has trigger point in the right paraspinal region approximately T10-T11.  This does reproduce a lot of her pain on exam.  She has another area just cranial to this.  This may be more rhomboid.  We are going to complete trigger point injection today with needling.  Return to physical therapy for dry needling and manual treatment is an option.  She will continue with ibuprofen.  2.  Chronic history of neck pain worse with rotation and flexion.  She has a significant trigger point in the levator scapula trapezius area on the left but this does not really reproduce that pain.  She has more of a symptom in the upper scalene and not can feel a taut band at this area as well.  She does have some level of prior spondylosis of the cervical spine.  Again reports current use of ibuprofen seems to be helping that to some degree.  We will go ahead and complete trigger point injection here as well.  Consideration in the future to do imaging if    Meds & Orders: No orders of the defined types were placed in this encounter.  Orders Placed This Encounter  Procedures  . Trigger Point Inj    Follow-up: Return if symptoms worsen or fail to improve.   Procedures: Trigger Point Inj  Date/Time: 04/19/2019 5:58 AM Performed by: Magnus Sinning, MD Authorized by: Magnus Sinning, MD   Consent Given by:  Patient Site marked: the procedure site was marked   Timeout: prior to procedure the correct patient, procedure, and site was verified   Total # of Trigger Points:  3 or more Location: neck and back   Needle Size:  25 G Approach:  Dorsal Medications #1:  20 mg triamcinolone acetonide 40 MG/ML Medications #2:  3 mL lidocaine 1 % Additional  Injections?: No   Patient tolerance:  Patient tolerated the procedure well with no immediate complications Comments: Trigger points palpated in the right paraspinal musculature into the lower throat Asik region and somewhat cranial and lateral which I think is rhomboid.  Paraspinal muscles probably multifidus.  We also palpated trigger point in the upper scalene or sternocleidomastoid.  These trigger points were injected and needling technique utilized    No notes on file   Clinical History: MRI THORACIC SPINE WITHOUT CONTRAST  TECHNIQUE: Multiplanar, multisequence MR imaging of the thoracic spine was performed. No intravenous contrast was administered.  COMPARISON:  Thoracic spine MRI 05/04/2005.  FINDINGS: Limited cervical spine imaging: Partially visible C6-C7 disc space loss and disc herniation on series 4, image 6.  Thoracic spine segmentation: Appears normal. The same numbering system is used today as on the 2007 MRI.  Alignment: Stable thoracic vertebral height and alignment since 2007. Mild straightening of lower thoracic kyphosis.  Vertebrae: Several benign chronic vertebral body hemangiomas in the lower thoracic spine are stable since 2007, most notable at T11 and T12. Underlying bone marrow signal remains within normal limits. Mild chronic endplate irregularity inferiorly at T10, and at T11-T12 is stable since 2007. No marrow edema or evidence of acute osseous abnormality.  Cord: Spinal cord signal is within normal limits at all visualized levels. Capacious thoracic spinal canal. Conus medullaris occurs below the T12-L1 level and is not visible.  Paraspinal and other soft tissues: Negative visualized thoracic inlet and thoracic viscera. Negative visualized posterior paraspinal soft tissues.  Disc levels:  T1-T2: Negative.  T2-T3: Mild facet hypertrophy.  No stenosis.  T3-T4: Borderline to mild facet hypertrophy.  No stenosis.  T4-T5:  Negative.  T5-T6: Negative.  T6-T7: Subtle central disc protrusion is new since 2007 but only faintly visible (series 4, image 7). No stenosis.  T7-T8: Small lobulated left greater than right paracentral disc protrusions are new or increased since 2007, best seen on series 8, image 23. No associated stenosis.  T8-T9: Subtle if any left paracentral disc protrusion.  No stenosis.  T9-T10: Mild facet hypertrophy.  No stenosis.  T10-T11: Minimal disc bulge. Mild facet and ligament flavum hypertrophy. No stenosis.  T11-T12: Minimal disc bulge.  No stenosis.  T12-L1: Negative.  IMPRESSION: 1. Largely stable MRI appearance of the thoracic spine since 2007. No acute or interval osseous abnormality. 2. Mild for age thoracic spine degeneration and capacious underlying thoracic spinal canal. Subtle disc protrusions from T6-T7 to T8-T9 are new or have increased since 2007, but results in no spinal stenosis or neural impingement.   Electronically Signed   By: Genevie Ann M.D.   On: 01/28/2017 15:25   She reports that she has never smoked. She has never used smokeless tobacco. No results for input(s): HGBA1C, LABURIC in the last 8760 hours.  Objective:  VS:  HT:    WT:   BMI:     BP:92/60  HR:65bpm  TEMP: ( )  RESP:  Physical Exam Vitals and nursing note reviewed.  Constitutional:      General: She is not in acute distress.    Appearance: Normal appearance. She is well-developed. She is not ill-appearing.     Comments: Thin appearing  HENT:     Head: Normocephalic and atraumatic.  Eyes:     Conjunctiva/sclera: Conjunctivae normal.     Pupils: Pupils are equal, round, and reactive to light.  Cardiovascular:     Rate and Rhythm: Normal rate.     Pulses: Normal pulses.  Pulmonary:     Effort: Pulmonary effort is normal.  Musculoskeletal:        General: Tenderness present.     Right lower leg: No edema.     Left lower leg: No edema.     Comments: Evaluation of  the thoracic spine shows somewhat increased kyphosis with pain to palpation in the right paraspinal region does reproduce most of her pain today.  There is a focal taut band in this area very palpable.  There is a smaller 1 more lateral and cranial but does reproduce some of the pain as well.  She has no pain over the vertebral bodies.  Examination of the cervical spine shows forward flexed cervical spine pain with left rotation more than right and flexion.  There is a taut band in the upper scalene musculature or perhaps sternocleidomastoid insertion.  Skin:    General: Skin is warm and dry.     Findings: No erythema or rash.  Neurological:     General: No focal deficit present.     Mental Status: She is alert and oriented to person, place, and time.     Sensory: No sensory deficit.     Motor: No weakness or abnormal muscle tone.     Coordination: Coordination normal.     Gait: Gait normal.  Psychiatric:        Mood and Affect: Mood normal.        Behavior: Behavior normal.     Ortho Exam Imaging: No results found.  Past Medical/Family/Surgical/Social History: Medications & Allergies reviewed per EMR, new medications updated. Patient Active Problem List   Diagnosis Date Noted  . Thoracic disc disease 04/19/2019  . Myofascial pain syndrome 04/19/2019  . Idiopathic peripheral neuropathy 02/07/2019  . Acquired hypothyroidism 02/03/2018  . Hormone replacement therapy (HRT) 02/03/2018  . Underweight 02/03/2018  . GERD (gastroesophageal reflux disease)   . Postural kyphosis of thoracic region 03/16/2017  . Neck pain 11/20/2016  . Chronic right-sided thoracic back pain 08/03/2016  . Celiac disease 07/20/2011  . Dietary iron deficiency - malabsorption 07/20/2011  . Chronic idiopathic monocytosis 07/20/2011   Past Medical History:  Diagnosis Date  . Acquired hypothyroidism 02/03/2018  . Anxiety   . Arthritis   . Celiac disease 07/20/2011  . Chronic idiopathic monocytosis 07/20/2011   . Dysphagia 12/2012   responsive to dexilant  . GERD (gastroesophageal reflux disease)   . H/O eating disorder   . Hypothyroidism   . Iron deficiency anemia 07/20/2011  . Iron deficiency anemia, unspecified 07/20/2011  . Migraine headache   . Other specified intestinal malabsorption 07/20/2011   Family History  Problem Relation Age of Onset  . Cancer Mother   . Heart disease Mother   . Heart disease Father   . Hyperlipidemia Father   . Hypertension Father   .  Stroke Father   . Hypertension Brother   . Cancer Maternal Grandmother   . Heart disease Paternal Grandmother   . Stroke Maternal Grandfather    Past Surgical History:  Procedure Laterality Date  . ABDOMINAL HYSTERECTOMY    . COLONOSCOPY  06/2003   neg  . DIAGNOSTIC LAPAROSCOPY    . ESOPHAGOGASTRODUODENOSCOPY N/A 10/20/2012   Procedure: ESOPHAGOGASTRODUODENOSCOPY (EGD);  Surgeon: Cleotis Nipper, MD;  Location: [facility];  Service: Endoscopy;  Laterality: N/A;  . FLEXIBLE SIGMOIDOSCOPY  2005  . SINUS IRRIGATION    . TMJ ARTHROPLASTY     Social History   Occupational History  . 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 Smoking status: Never Smoker  . Smokeless tobacco: Never Used  Substance and Sexual Activity  . Alcohol use: No    Alcohol/week: 0.0 standard drinks  . Drug use: No  . Sexual activity: Yes    Birth control/protection: Surgical

## 2019-05-01 MED FILL — DOTTI 0.05 MG/24HR PTTW: 0.05 | 84 days supply | Qty: 24 | Fill #3

## 2019-05-01 MED FILL — GABAPENTIN 100 MG CAPSULE: 100 | 10 days supply | Qty: 90 | Fill #1

## 2019-05-09 ENCOUNTER — Encounter: Payer: 59 | Admitting: Family Medicine

## 2019-05-09 MED FILL — LEVOTHYROXINE SODIUM 112 MC: 112 | 90 days supply | Qty: 90 | Fill #2

## 2019-07-11 ENCOUNTER — Telehealth: Payer: Self-pay | Admitting: Physical Medicine and Rehabilitation

## 2019-07-11 ENCOUNTER — Encounter: Payer: 59 | Admitting: Family Medicine

## 2019-07-11 NOTE — Telephone Encounter (Signed)
Scheduled for 6/15 at 0930.

## 2019-07-11 NOTE — Telephone Encounter (Signed)
Patient was last seen 04/18/19 for trigger point injections. Please advise.

## 2019-07-11 NOTE — Telephone Encounter (Signed)
OV plus potential trigger point

## 2019-07-11 NOTE — Telephone Encounter (Signed)
Patient would like to make an appointment for her ongoing back pain.  CB#[phone]

## 2019-07-17 MED FILL — DOTTI 0.05 MG/24HR PTTW: 0.05 | 84 days supply | Qty: 24 | Fill #4

## 2019-07-17 MED FILL — GABAPENTIN 100 MG CAPSULE: 100 | 10 days supply | Qty: 90 | Fill #2

## 2019-07-17 MED FILL — clonazePAM 0.5 MG TABS: 0.5 | 60 days supply | Qty: 30 | Fill #1

## 2019-07-18 ENCOUNTER — Other Ambulatory Visit: Payer: Self-pay

## 2019-07-18 ENCOUNTER — Ambulatory Visit: Payer: Self-pay

## 2019-07-18 ENCOUNTER — Ambulatory Visit: Payer: 59 | Admitting: Physical Medicine and Rehabilitation

## 2019-07-18 ENCOUNTER — Encounter: Payer: Self-pay | Admitting: Physical Medicine and Rehabilitation

## 2019-07-18 VITALS — BP 103/64 | HR 60 | Ht 67.0 in | Wt 95.0 lb

## 2019-07-18 DIAGNOSIS — R102 Pelvic and perineal pain: Secondary | ICD-10-CM

## 2019-07-18 DIAGNOSIS — M25551 Pain in right hip: Secondary | ICD-10-CM | POA: Diagnosis not present

## 2019-07-18 DIAGNOSIS — M542 Cervicalgia: Secondary | ICD-10-CM

## 2019-07-18 DIAGNOSIS — M7918 Myalgia, other site: Secondary | ICD-10-CM | POA: Diagnosis not present

## 2019-07-18 DIAGNOSIS — M5441 Lumbago with sciatica, right side: Secondary | ICD-10-CM | POA: Diagnosis not present

## 2019-07-18 DIAGNOSIS — G8929 Other chronic pain: Secondary | ICD-10-CM

## 2019-07-18 DIAGNOSIS — M546 Pain in thoracic spine: Secondary | ICD-10-CM

## 2019-07-18 DIAGNOSIS — M5442 Lumbago with sciatica, left side: Secondary | ICD-10-CM | POA: Diagnosis not present

## 2019-07-18 NOTE — Progress Notes (Signed)
Pt states pain in the neck, the right side of the middle back, and the right side of the lower back and radiates into the back of the right thigh. Pt states pain started a couple of months ago. Pt states last trigger point injection helped a lot especially the neck and shoulder. Bending and driving makes pain worse. Sitting up straight helps with pain.  .Numeric Pain Rating Scale and Functional Assessment Average Pain 7 Pain Right Now 5 My pain is intermittent, sharp, dull and aching Pain is worse with: bending Pain improves with: therapy/exercise   In the last MONTH (on 0-10 scale) has pain interfered with the following?  1. General activity like being  able to carry out your everyday physical activities such as walking, climbing stairs, carrying groceries, or moving a chair?  Rating(6)  2. Relation with others like being able to carry out your usual social activities and roles such as  activities at home, at work and in your community. Rating(6)  3. Enjoyment of life such that you have  been bothered by emotional problems such as feeling anxious, depressed or irritable?  Rating(5)

## 2019-07-19 MED ORDER — LIDOCAINE HCL 1 % IJ SOLN
3.0000 mL | INTRAMUSCULAR | Status: AC | PRN
  Administered 2019-07-18: 3 mL

## 2019-07-19 MED ORDER — TRIAMCINOLONE ACETONIDE 40 MG/ML IJ SUSP
20.0000 mg | INTRAMUSCULAR | Status: AC | PRN
  Administered 2019-07-18: 20 mg via INTRAMUSCULAR

## 2019-07-19 NOTE — Progress Notes (Signed)
Marilyn Collins - 64 y.o. female MRN [id number]  Date of birth: [birth year]  Office Visit Note: Visit Date: 07/18/2019 PCP: [place], [place] Referred by: No ref. provider found  Subjective: Chief Complaint  Patient presents with  . Neck - Pain  . Middle Back - Pain  . Right Thigh - Pain   HPI: Marilyn Collins is a 64 y.o. female who comes in today For evaluation and management of 2 distinct ongoing problems with one that is more of a recent exacerbation.  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  She was originally seen by Dr. Teresa Coombs and has had some physical therapy at the [facility] with dry needling.  She reports the trigger point injections in the upper back really helped her more than anything.  We actually had completed thoracic epidural injections in the past as well and that was helpful to a degree.  Her chronic pain that is ongoing that seems to exacerbate on occasion is more myofascial pain with pretty profound trigger points in the bilateral levator scapula and trapezius and then upper sternocleidomastoid paraspinal region.  She gets a lot of trigger points around the rhomboids as well.  Today she is having increased pain in the left upper back and also the right mid back around the paraspinal region inferior angle of the scapula.  She does not have any specific injury.  She is very active and does lift weights.  She has had an issue with maintaining body weight is very thin individual.  She does not report any specific injury does report last trigger point injections were very beneficial.  These were in March.  She reports these have been more beneficial than the dry needling.  This is an exacerbation of an ongoing chronic condition.  Patient also reports today  flareup of chronic low back pain.  We have not really treated her for low back pain she had some manipulation by Dr. Teresa Coombs at one point.  I did find an MRI from 2013 showing small posterior lateral disc protrusion at L4-5 with mild facet arthropathy but without any listhesis or stenosis.  I did obtain x-rays of the lumbar spine today and those are reviewed below.  She reports a feeling of pain that is deep into the pelvic area right buttock and almost ischium.  She reports some referral deep into the sacral or coccyx.  Not really coccydynia.  No pain with just sitting on that area.  She gets some referral down the back of the thigh.  No real paresthesias.  She has not had any specific injury.  This is been ongoing now for many months but just worsening to the point where she mentions it today because it is really affecting her with bending and driving.  She reports decreasing symptoms with better posture in sitting upright.  Review of Systems  Musculoskeletal: Positive for back pain and neck pain.       Back pain with referral into the posterior right thigh.  All other systems reviewed and are negative.  Otherwise per HPI.  Assessment & Plan: Visit Diagnoses:  1. Chronic bilateral low back pain with bilateral sciatica   2. Pain in right hip   3. Pelvic pain  4. Cervicalgia   5. Pain in thoracic spine   6. Myofascial pain syndrome     Plan: Findings:  1.  Chronic cervical and thoracic pain mainly secondary to myofascial trigger points also some history of increasing thoracic kyphosis without any compression fractures etc.  Epidural injection had been beneficial but the trigger point injections have really helped her greatly.  We will repeat those today and that is dictated separately.  She can obviously have these every so often and could consider deep tissue massage.  Information for [facility] given to her.  2.  Chronic low back pain with recent several month exacerbation of right  lower back and posterior buttock and thigh pain with some referral into the deeper pelvic ischium.  Some tenderness over the ischium but not frank tenderness.  At this point I think the best approach is to assume that this is mechanical with her and at least see if physical therapy for a short course would help looking at postural differences in trying to help diagnose if this is more mechanical.  Could be sacroiliac joint dysfunction versus myofascial trigger point as well.  Consider dry needling.  They can also make recommendations a specific pelvic physical therapy would be needed.  Depending on relief of that would look at updated MRI of the lumbar spine.    Meds & Orders: No orders of the defined types were placed in this encounter.   Orders Placed This Encounter  Procedures  . Trigger Point Inj  . XR Lumbar Spine 2-3 Views  . Ambulatory referral to Physical Therapy    Follow-up: Return if symptoms worsen or fail to improve.   Procedures: Trigger Point Inj  Date/Time: 07/18/2019 9:32 AM Performed by: Magnus Sinning, MD Authorized by: Magnus Sinning, MD   Consent Given by:  Patient Site marked: the procedure site was marked   Timeout: prior to procedure the correct patient, procedure, and site was verified   Total # of Trigger Points:  3 or more Location: neck and back   Needle Size:  25 G Approach:  Dorsal Medications #1:  20 mg triamcinolone acetonide 40 MG/ML Medications #2:  3 mL lidocaine 1 % Additional Injections?: No   Patient tolerance:  Patient tolerated the procedure well with no immediate complications Comments: Trigger points palpated in the left upper insertion of the sternocleidomastoid as well as the levator scapula and trapezius on the left.  Trigger point palpated on the right paraspinal thoracic region.  This is at the level of the inferior angle of the scapula.    No notes on file   Clinical History: MRI lumbar spine 2013 L4-L5: Disc desiccation and a  small posterolateral disc protrusion  on the right are chronic and unchanged. There is mild bilateral  facet hypertrophy. No foraminal compromise or nerve root  encroachment is present.   L5-S1: Disc height and hydration are maintained. There is mild  bilateral facet hypertrophy without resulting foraminal compromise  or nerve root encroachment.   IMPRESSION:   1. Stable small posterolateral disc protrusion on the right at L4-  L5. No resulting spinal stenosis or nerve root encroachment.  2. Mild facet disease inferiorly.  3. No acute disc space findings, significant spinal stenosis or  nerve root encroachment.  4. 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 Correlate clinically.   Original Report Authenticated By: Vivia Ewing, M.D. ---  MRI THORACIC SPINE WITHOUT CONTRAST    TECHNIQUE:  Multiplanar, multisequence MR  imaging of the thoracic spine was  performed. No intravenous contrast was administered.    COMPARISON: Thoracic spine MRI 05/04/2005.    FINDINGS:  Limited cervical spine imaging: Partially visible C6-C7 disc space  loss and disc herniation on series 4, image 6.    Thoracic spine segmentation: Appears normal. The same numbering  system is used today as on the 2007 MRI.    Alignment: Stable thoracic vertebral height and alignment since  2007. Mild straightening of lower thoracic kyphosis.    Vertebrae: Several benign chronic vertebral body hemangiomas in the  lower thoracic spine are stable since 2007, most notable at T11 and  T12. Underlying bone marrow signal remains within normal limits.  Mild chronic endplate irregularity inferiorly at T10, and at T11-T12  is stable since 2007. No marrow edema or evidence of acute osseous  abnormality.    Cord: Spinal cord signal is within normal limits at all visualized  levels. Capacious thoracic spinal canal. Conus medullaris occurs  below the  T12-L1 level and is not visible.    Paraspinal and other soft tissues: Negative visualized thoracic  inlet and thoracic viscera. Negative visualized posterior paraspinal  soft tissues.    Disc levels:    T1-T2: Negative.    T2-T3: Mild facet hypertrophy. No stenosis.    T3-T4: Borderline to mild facet hypertrophy. No stenosis.    T4-T5: Negative.    T5-T6: Negative.    T6-T7: Subtle central disc protrusion is new since 2007 but only  faintly visible (series 4, image 7). No stenosis.    T7-T8: Small lobulated left greater than right paracentral disc  protrusions are new or increased since 2007, best seen on series 8,  image 23. No associated stenosis.    T8-T9: Subtle if any left paracentral disc protrusion. No stenosis.    T9-T10: Mild facet hypertrophy. No stenosis.    T10-T11: Minimal disc bulge. Mild facet and ligament flavum  hypertrophy. No stenosis.    T11-T12: Minimal disc bulge. No stenosis.    T12-L1: Negative.    IMPRESSION:  1. Largely stable MRI appearance of the thoracic spine since 2007.  No acute or interval osseous abnormality.  2. Mild for age thoracic spine degeneration and capacious underlying  thoracic spinal canal. Subtle disc protrusions from T6-T7 to T8-T9  are new or have increased since 2007, but results in no spinal  stenosis or neural impingement.      Electronically Signed  By: Genevie Ann M.D.  On: 01/28/2017 15:25   She reports that she has never smoked. She has never used smokeless tobacco. No results for input(s): HGBA1C, LABURIC in the last 8760 hours.  Objective:  VS:  HT:5' 7"  (170.2 cm)   WT:95 lb (43.1 kg)  BMI:14.88    BP:103/64  HR:60bpm  TEMP: ( )  RESP:  Physical Exam Vitals and nursing note reviewed.  Constitutional:      General: She is not in acute distress.    Appearance: Normal appearance. She is well-developed.     Comments: Very thin but muscular  HENT:     Head: Normocephalic and  atraumatic.     Nose: Nose normal.     Mouth/Throat:     Mouth: Mucous membranes are moist.     Pharynx: Oropharynx is clear.  Eyes:     Conjunctiva/sclera: Conjunctivae normal.     Pupils: Pupils are equal, round, and reactive to light.  Cardiovascular:     Rate and Rhythm: Regular rhythm.  Pulmonary:  Effort: Pulmonary effort is normal. No respiratory distress.  Abdominal:     General: There is no distension.     Palpations: Abdomen is soft.     Tenderness: There is no guarding.  Musculoskeletal:     Cervical back: Neck supple. Tenderness present. No rigidity.     Right lower leg: No edema.     Left lower leg: No edema.     Comments: Examination of the cervical spine shows increased forward flexion some increased kyphosis of the thoracic spine.  She has focal active trigger points really in multiple places but mainly in the left levator scapula trapezius and sternocleidomastoid.  She also has active trigger point in the right paraspinal longissimus muscle at about the level of the inferior angle of the scapula.  She has good range of motion of the shoulders.  Good upper body strength.  Examination of the lower spine shows equivocal slump test on the right with tight hamstrings bilaterally.  She has some tenderness over the right ischium but not exquisite.  She has no pain with hip rotation although they do seem a little tight.  She has good distal strength.  Lymphadenopathy:     Cervical: No cervical adenopathy.  Skin:    General: Skin is warm and dry.     Findings: No erythema or rash.  Neurological:     General: No focal deficit present.     Mental Status: She is alert and oriented to person, place, and time.     Motor: No abnormal muscle tone.     Coordination: Coordination normal.     Gait: Gait normal.  Psychiatric:        Mood and Affect: Mood normal.        Behavior: Behavior normal.        Thought Content: Thought content normal.     Ortho Exam  Imaging: XR  Lumbar Spine 2-3 Views  Result Date: 07/19/2019 AP and lateral lumbar spine shows very mild thoracolumbar scoliosis centered about T11.  She does have a transitional segment with what will termed a lumbarized S1 segment with full ribs at T12.  Given that numbering scheme she does have facet arthropathy at L4-5 and L5-S1 with may be small retrolisthesis of L4 on L5.  She has good maintained disc heights.  She has increased lumbar lordosis.  Hips can be seen bilaterally without any arthritic changes.   Past Medical/Family/Surgical/Social History: Medications & Allergies reviewed per EMR, new medications updated. Patient Active Problem List   Diagnosis Date Noted  . Thoracic disc disease 04/19/2019  . Myofascial pain syndrome 04/19/2019  . Idiopathic peripheral neuropathy 02/07/2019  . Acquired hypothyroidism 02/03/2018  . Hormone replacement therapy (HRT) 02/03/2018  . Underweight 02/03/2018  . GERD (gastroesophageal reflux disease)   . Postural kyphosis of thoracic region 03/16/2017  . Neck pain 11/20/2016  . Chronic right-sided thoracic back pain 08/03/2016  . Celiac disease 07/20/2011  . Dietary iron deficiency - malabsorption 07/20/2011  . Chronic idiopathic monocytosis 07/20/2011   Past Medical History:  Diagnosis Date  . Acquired hypothyroidism 02/03/2018  . Anxiety   . Arthritis   . Celiac disease 07/20/2011  . Chronic idiopathic monocytosis 07/20/2011  . Dysphagia 12/2012   responsive to dexilant  . GERD (gastroesophageal reflux disease)   . H/O eating disorder   . Hypothyroidism   . Iron deficiency anemia 07/20/2011  . Iron deficiency anemia, unspecified 07/20/2011  . Migraine headache   . Other specified intestinal malabsorption 07/20/2011  Family History  Problem Relation Age of Onset  . Cancer Mother   . Heart disease Mother   . Heart disease Father   . Hyperlipidemia Father   . Hypertension Father   . Stroke Father   . Hypertension Brother   . Cancer Maternal  Grandmother   . Heart disease Paternal Grandmother   . Stroke Maternal Grandfather    Past Surgical History:  Procedure Laterality Date  . ABDOMINAL HYSTERECTOMY    . COLONOSCOPY  06/2003   neg  . DIAGNOSTIC LAPAROSCOPY    . ESOPHAGOGASTRODUODENOSCOPY N/A 10/20/2012   Procedure: ESOPHAGOGASTRODUODENOSCOPY (EGD);  Surgeon: Cleotis Nipper, MD;  Location: [facility];  Service: Endoscopy;  Laterality: N/A;  . FLEXIBLE SIGMOIDOSCOPY  2005  . SINUS IRRIGATION    . TMJ ARTHROPLASTY     Social History   Occupational History  . 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  Tobacco Use  . Smoking status: Never Smoker  . Smokeless tobacco: Never Used  Vaping Use  . Vaping Use: Never used  Substance and Sexual Activity  . Alcohol use: No    Alcohol/week: 0.0 standard drinks  . Drug use: No  . Sexual activity: Yes    Birth control/protection: Surgical

## 2019-08-04 ENCOUNTER — Encounter: Payer: Self-pay | Admitting: Physical Therapy

## 2019-08-04 ENCOUNTER — Ambulatory Visit: Payer: 59 | Admitting: Physical Therapy

## 2019-08-04 ENCOUNTER — Other Ambulatory Visit: Payer: Self-pay

## 2019-08-04 DIAGNOSIS — M25551 Pain in right hip: Secondary | ICD-10-CM | POA: Diagnosis not present

## 2019-08-04 DIAGNOSIS — G8929 Other chronic pain: Secondary | ICD-10-CM | POA: Diagnosis not present

## 2019-08-04 DIAGNOSIS — M546 Pain in thoracic spine: Secondary | ICD-10-CM

## 2019-08-04 DIAGNOSIS — M6281 Muscle weakness (generalized): Secondary | ICD-10-CM | POA: Diagnosis not present

## 2019-08-04 DIAGNOSIS — M545 Low back pain, unspecified: Secondary | ICD-10-CM

## 2019-08-04 NOTE — Patient Instructions (Signed)
Access Code: [facility] URL: [email or web address] Date: 08/04/2019 Prepared by: Elsie Ra  Exercises Supine Double Knee to Chest - 2 x daily - 6 x weekly - 3 reps - 1 sets - 30 hold Supine Posterior Pelvic Tilt - 2 x daily - 6 x weekly - 10 reps - 2 sets - 5 hold Cat to Child's Pose with Posterior Pelvic Tilt - 2 x daily - 6 x weekly - 1 sets - 3 reps - 30 hold Quadruped Cat with Posterior Pelvic Tilt - 2 x daily - 6 x weekly - 2 sets - 10 reps Standing 3-Way Leg Reach with Resistance at Ankles and Counter Support - 2 x daily - 6 x weekly - 2-3 sets - 10 reps

## 2019-08-04 NOTE — Addendum Note (Signed)
Addended by: Debbe Odea on: 08/04/2019 09:26 AM   Modules accepted: Orders

## 2019-08-04 NOTE — Therapy (Signed)
[place], [place], [address] Phone: [phone]   Fax:  [phone]  Physical Therapy Evaluation  Patient Details  Name: Marilyn Collins MRN: [id number] Date of Birth: 11/16/1955 Referring Provider (PT): Ernestina Patches, MD   Encounter Date: 08/04/2019   PT End of Session - 08/04/19 0909    Visit Number 1    Number of Visits 12    Date for PT Re-Evaluation 09/15/19    Authorization Type [facility]    PT Start Time 0800    PT Stop Time 0845    PT Time Calculation (min) 45 min    Activity Tolerance Patient tolerated treatment well    Behavior During Therapy [facility] for tasks assessed/performed           Past Medical History:  Diagnosis Date  . Acquired hypothyroidism 02/03/2018  . Anxiety   . Arthritis   . Celiac disease 07/20/2011  . Chronic idiopathic monocytosis 07/20/2011  . Dysphagia 12/2012   responsive to dexilant  . GERD (gastroesophageal reflux disease)   . H/O eating disorder   . Hypothyroidism   . Iron deficiency anemia 07/20/2011  . Iron deficiency anemia, unspecified 07/20/2011  . Migraine headache   . Other specified intestinal malabsorption 07/20/2011    Past Surgical History:  Procedure Laterality Date  . ABDOMINAL HYSTERECTOMY    . COLONOSCOPY  06/2003   neg  . DIAGNOSTIC LAPAROSCOPY    . ESOPHAGOGASTRODUODENOSCOPY N/A 10/20/2012   Procedure: ESOPHAGOGASTRODUODENOSCOPY (EGD);  Surgeon: Cleotis Nipper, MD;  Location: [facility];  Service: Endoscopy;  Laterality: N/A;  . FLEXIBLE SIGMOIDOSCOPY  2005  . SINUS IRRIGATION    . TMJ ARTHROPLASTY      There were no vitals filed for this visit.    Subjective Assessment - 08/04/19 0804    Subjective She is here for acute on Chronic LBP, with pain referring to her Right hip and  pelvic pain. She is Therapist, sports for [employer]. She has had injections, chiro, PT and Dry needling in the past. She is very active and does lift weights  She has not had any specific injury.  This  is been ongoing now for many months but just worsening over last 6 weeks. She also is having increased tightness in hamstrings and it hurts if she tries to stretch them    How long can you sit comfortably? depends    How long can you stand comfortably? depends    How long can you walk comfortably? depends    Diagnostic tests Imaging: MRI from 2013 showing small posterior lateral disc protrusion at L4-5 with mild facet arthropathy but without any listhesis or stenosis. XR 07/2019 show "facet arthropathy $-5 and inc lumbar lordosis"    Patient Stated Goals decrease pain    Currently in Pain? Yes    Pain Score 5     Pain Location Back    Pain Orientation Lower    Pain Descriptors / Indicators Aching;Shooting    Pain Type Chronic pain   acute on chronic   Pain Radiating Towards down her Rt leg to her knee but not past her knee, denies N/T says it is just pain    Pain Onset More than a month ago    Pain Frequency Constant    Aggravating Factors  lifting, sit to stands, squats, lunges, stretching her hamstrings    Pain Relieving Factors sitting with her leg folded back,  [facility] PT Assessment - 08/04/19 0001      Assessment   Medical Diagnosis Chronic LBP, pain in Right hip, pelvic pain    Referring Provider (PT) Ernestina Patches, MD    Onset Date/Surgical Date --   acute for 6 weeks on chronic pain   Hand Dominance Right    Next MD Visit PRN    Prior Therapy nothing recent      Precautions   Precautions None      Restrictions   Weight Bearing Restrictions Yes      Balance Screen   Has the patient fallen in the past 6 months No    Has the patient had a decrease in activity level because of a fear of falling?  No    Is the patient reluctant to leave their home because of a fear of falling?  No      Home Ecologist residence      Prior Function   Level of Independence Independent    Vocation Full time employment    Chief Technology Officer at  [employer] ride motorcycles, workout at gym      Cognition   Overall Cognitive Status Within Functional Limits for tasks assessed      Sensation   Light Touch Appears Intact      Coordination   Gross Motor Movements are Fluid and Coordinated Yes      Posture/Postural Control   Posture Comments increaed lumbar lordosis with anterior pelvic tilt      ROM / Strength   AROM / PROM / Strength AROM;Strength      AROM   AROM Assessment Site Lumbar    Lumbar Flexion 90%   pain referred to her groin   Lumbar Extension 75%   no pain   Lumbar - Right Side Bend 75%   mild pain   Lumbar - Left Side Bend 75%   mild pain   Lumbar - Right Rotation 75%   no pain   Lumbar - Left Rotation 75%   no pain     Strength   Overall Strength Comments mild core weakness    Strength Assessment Site Hip;Knee    Right/Left Hip Right;Left    Right Hip Flexion 4-/5    Right Hip Extension 4-/5    Right Hip ABduction 4-/5    Left Hip Flexion 4/5    Left Hip Extension 4/5    Left Hip ABduction 4/5    Right/Left Knee Right;Left    Right Knee Flexion 5/5    Right Knee Extension 5/5    Left Knee Flexion 5/5    Left Knee Extension 5/5      Flexibility   Soft Tissue Assessment /Muscle Length --   tighness in hip flexors bilat with + thomas test     Special Tests   Other special tests neg slump test bilat for neural tension, negative quadrant test bilat for facet arthropathy, negative SLR test bilat for disc pathology. Inconclusive dirction preference testing for flexion or extension      Transfers   Transfers Independent with all Transfers      Ambulation/Gait   Gait Comments WNL                      Objective measurements completed on examination: See above findings.       [facility] Adult PT Treatment/Exercise - 08/04/19 0001      Exercises   Exercises Lumbar  Lumbar Exercises: Stretches   Double Knee to Chest Stretch 1 rep;30 seconds    Other Lumbar Stretch Exercise  child pose 30 sec X 1                  PT Education - 08/04/19 0908    Education Details HEP, POC, exam findings    Person(s) Educated Patient    Methods Explanation;Demonstration;Verbal cues;Handout    Comprehension Verbalized understanding;Need further instruction               PT Long Term Goals - 08/04/19 0916      PT LONG TERM GOAL #1   Title independent with HEP. Target for all goals (09/15/19)    Time 6    Status New    Target Date 09/15/19      PT LONG TERM GOAL #2   Title Improve hip strength to at least 5-/5 to improve function and improve lumbar/pelvic stability    Time 6    Period Weeks    Status New      PT LONG TERM GOAL #3   Title perform thoracolumbar ROM without increase in pain for improved mobility    Time 6    Period Weeks    Status New      PT LONG TERM GOAL #4   Title tolerate full work day without increase in pain for improved activity tolerance    Time 6    Period Weeks    Status New                  Plan - 08/04/19 0909    Clinical Impression Statement Aithana presents with acute on chronic LBP with referred pain into her Rt hip/pelvis. Special testing negative today and direction preference inconclusive however due to her increased lumbar lordosis and anterior pelvic tilt she likely will benefit from from more posterior pelvic tilt/lumbar flexion exercises. She does have decreased moderate hip weakness and mild core weakness likely contributing her pain. She will benefit from skilled PT to address her functional deficits.    Personal Factors and Comorbidities Time since onset of injury/illness/exacerbation;Comorbidity 1;Comorbidity 2    Comorbidities chronic neck/back pain, underweight, celiac, OA    Examination-Activity Limitations Bend;Carry;Squat;Stairs;Stand;Lift    Examination-Participation Restrictions Cleaning;Community Activity;Driving;Laundry;Yard Work;Shop    Stability/Clinical Decision Making Evolving/Moderate  complexity    Clinical Decision Making Moderate    Rehab Potential Good    PT Frequency 2x / week   1-2   PT Duration 6 weeks    PT Treatment/Interventions ADLs/Self Care Home Management;Cryotherapy;Electrical Stimulation;Iontophoresis 80m/ml Dexamethasone;Moist Heat;Traction;Ultrasound;Therapeutic activities;Therapeutic exercise;Neuromuscular re-education;Manual techniques;Patient/family education;Passive range of motion;Dry needling;Joint Manipulations;Spinal Manipulations;Taping    PT Next Visit Plan review and update HEP PRN, needs hip/glute and core strength, address her lumbar lordosis    PT Home Exercise Plan Access Code: [email or web address]    Consulted and Agree with Plan of Care Patient           Patient will benefit from skilled therapeutic intervention in order to improve the following deficits and impairments:  Decreased activity tolerance, Decreased range of motion, Decreased strength, Increased fascial restricitons, Increased muscle spasms, Impaired flexibility, Postural dysfunction, Pain  Visit Diagnosis: Chronic bilateral low back pain, unspecified whether sciatica present  Muscle weakness (generalized)  Pain in thoracic spine  Pain in right hip     Problem List Patient Active Problem List   Diagnosis Date Noted  . Thoracic disc disease 04/19/2019  . Myofascial pain syndrome 04/19/2019  . Idiopathic  peripheral neuropathy 02/07/2019  . Acquired hypothyroidism 02/03/2018  . Hormone replacement therapy (HRT) 02/03/2018  . Underweight 02/03/2018  . GERD (gastroesophageal reflux disease)   . Postural kyphosis of thoracic region 03/16/2017  . Neck pain 11/20/2016  . Chronic right-sided thoracic back pain 08/03/2016  . Celiac disease 07/20/2011  . Dietary iron deficiency - malabsorption 07/20/2011  . Chronic idiopathic monocytosis 07/20/2011    Debbe Odea, PT,DPT 08/04/2019, 9:22 AM  [facility] Physical Therapy [address] [place], [place],  [address] Phone: [phone]   Fax:  [phone]  Name: Marilyn Collins MRN: [id number] Date of Birth: 05/02/1955

## 2019-08-07 MED FILL — LEVOTHYROXINE SODIUM 112 MC: 112 | 90 days supply | Qty: 90 | Fill #3

## 2019-08-08 MED FILL — SUMAtriptan SUCCINATE 50 MG: 50 | 30 days supply | Qty: 10 | Fill #2

## 2019-08-15 MED FILL — traMADol HCL 50 MG TABS: 50 | 3 days supply | Qty: 14 | Fill #0

## 2019-08-15 MED FILL — PENICILLIN VK 500 MG TABLET: 500 | 7 days supply | Qty: 28 | Fill #0

## 2019-08-17 DIAGNOSIS — E785 Hyperlipidemia, unspecified: Secondary | ICD-10-CM | POA: Diagnosis not present

## 2019-08-17 DIAGNOSIS — E039 Hypothyroidism, unspecified: Secondary | ICD-10-CM | POA: Diagnosis not present

## 2019-08-17 DIAGNOSIS — Z0000 Encounter for general adult medical examination without abnormal findings: Secondary | ICD-10-CM | POA: Diagnosis not present

## 2019-08-17 DIAGNOSIS — D509 Iron deficiency anemia, unspecified: Secondary | ICD-10-CM | POA: Diagnosis not present

## 2019-08-17 DIAGNOSIS — F419 Anxiety disorder, unspecified: Secondary | ICD-10-CM | POA: Diagnosis not present

## 2019-08-18 ENCOUNTER — Encounter: Payer: Self-pay | Admitting: Physical Therapy

## 2019-08-18 ENCOUNTER — Other Ambulatory Visit: Payer: Self-pay

## 2019-08-18 ENCOUNTER — Ambulatory Visit: Payer: 59 | Admitting: Physical Therapy

## 2019-08-18 DIAGNOSIS — M546 Pain in thoracic spine: Secondary | ICD-10-CM | POA: Diagnosis not present

## 2019-08-18 DIAGNOSIS — M545 Low back pain, unspecified: Secondary | ICD-10-CM

## 2019-08-18 DIAGNOSIS — G8929 Other chronic pain: Secondary | ICD-10-CM | POA: Diagnosis not present

## 2019-08-18 DIAGNOSIS — M25551 Pain in right hip: Secondary | ICD-10-CM

## 2019-08-18 DIAGNOSIS — M6281 Muscle weakness (generalized): Secondary | ICD-10-CM

## 2019-08-18 MED FILL — LEVOTHYROXINE SODIUM 125 MC: 125 | 28 days supply | Qty: 12 | Fill #0

## 2019-08-18 NOTE — Therapy (Signed)
[place], [place], [address] Phone: [phone]   Fax:  [phone]  Physical Therapy Treatment  Patient Details  Name: Marilyn Collins MRN: [id number] Date of Birth: 03-23-1955 Referring Provider (PT): Ernestina Patches, MD   Encounter Date: 08/18/2019   PT End of Session - 08/18/19 0824    Visit Number 2    Number of Visits 12    Date for PT Re-Evaluation 09/15/19    Authorization Type [facility]    PT Start Time 0800    PT Stop Time 0845    PT Time Calculation (min) 45 min    Activity Tolerance Patient tolerated treatment well    Behavior During Therapy [facility] for tasks assessed/performed           Past Medical History:  Diagnosis Date  . Acquired hypothyroidism 02/03/2018  . Anxiety   . Arthritis   . Celiac disease 07/20/2011  . Chronic idiopathic monocytosis 07/20/2011  . Dysphagia 12/2012   responsive to dexilant  . GERD (gastroesophageal reflux disease)   . H/O eating disorder   . Hypothyroidism   . Iron deficiency anemia 07/20/2011  . Iron deficiency anemia, unspecified 07/20/2011  . Migraine headache   . Other specified intestinal malabsorption 07/20/2011    Past Surgical History:  Procedure Laterality Date  . ABDOMINAL HYSTERECTOMY    . COLONOSCOPY  06/2003   neg  . DIAGNOSTIC LAPAROSCOPY    . ESOPHAGOGASTRODUODENOSCOPY N/A 10/20/2012   Procedure: ESOPHAGOGASTRODUODENOSCOPY (EGD);  Surgeon: Cleotis Nipper, MD;  Location: [facility];  Service: Endoscopy;  Laterality: N/A;  . FLEXIBLE SIGMOIDOSCOPY  2005  . SINUS IRRIGATION    . TMJ ARTHROPLASTY      There were no vitals filed for this visit.   Subjective Assessment - 08/18/19 0820    Subjective relays overall her back pain is about the same overall maybe a little bit better but she is arriving after working 12 hour shift so her pain is 8/10 overall. She says aerobic activivity of upright bike and elliptical at gym seem to be making her worse.    How long can  you sit comfortably? depends    How long can you stand comfortably? depends    How long can you walk comfortably? depends    Diagnostic tests Imaging: MRI from 2013 showing small posterior lateral disc protrusion at L4-5 with mild facet arthropathy but without any listhesis or stenosis. XR 07/2019 show "facet arthropathy $-5 and inc lumbar lordosis"    Patient Stated Goals decrease pain    Pain Onset More than a month ago              [facility] Adult PT Treatment/Exercise - 08/18/19 0001      Lumbar Exercises: Stretches   Other Lumbar Stretch Exercise child pose 30 sec X 2      Lumbar Exercises: Aerobic   Recumbent Bike L2, stopped after 3 minutes due to back pain      Lumbar Exercises: Supine   Bridge --    Single Leg Bridge 10 reps    Bridge with [employer] Limitations on both sides      Lumbar Exercises: Quadruped   Opposite Arm/Leg Raise 10 reps      Modalities   Modalities Traction      Traction   Type of Traction Lumbar    Min (lbs) 35    Max (lbs) 40   she weighs 95 lbs   Hold Time 60  Rest Time 20    Time 20                       PT Long Term Goals - 08/04/19 0916      PT LONG TERM GOAL #1   Title independent with HEP. Target for all goals (09/15/19)    Time 6    Status New    Target Date 09/15/19      PT LONG TERM GOAL #2   Title Improve hip strength to at least 5-/5 to improve function and improve lumbar/pelvic stability    Time 6    Period Weeks    Status New      PT LONG TERM GOAL #3   Title perform thoracolumbar ROM without increase in pain for improved mobility    Time 6    Period Weeks    Status New      PT LONG TERM GOAL #4   Title tolerate full work day without increase in pain for improved activity tolerance    Time 6    Period Weeks    Status New                 Plan - 08/18/19 3419    Clinical Impression Statement She is continuing to have back pain with Rt radiculopathy. She was trialed on mechanical traction  today which did appear to centralize her back pain some with less radiculopathy. Added SL bridges and birddogs into her HEP to progress core stablity at home.    Personal Factors and Comorbidities Time since onset of injury/illness/exacerbation;Comorbidity 1;Comorbidity 2    Comorbidities chronic neck/back pain, underweight, celiac, OA    Examination-Activity Limitations Bend;Carry;Squat;Stairs;Stand;Lift    Examination-Participation Restrictions Cleaning;Community Activity;Driving;Laundry;Yard Work;Shop    Stability/Clinical Decision Making Evolving/Moderate complexity    Rehab Potential Good    PT Frequency 2x / week   1-2   PT Duration 6 weeks    PT Treatment/Interventions ADLs/Self Care Home Management;Cryotherapy;Electrical Stimulation;Iontophoresis 55m/ml Dexamethasone;Moist Heat;Traction;Ultrasound;Therapeutic activities;Therapeutic exercise;Neuromuscular re-education;Manual techniques;Patient/family education;Passive range of motion;Dry needling;Joint Manipulations;Spinal Manipulations;Taping    PT Next Visit Plan how was traction? needs hip/glute and core strength, address her lumbar lordosis    PT Home Exercise Plan Access Code: [email or web address], added SL bridges and bird dogs    Consulted and Agree with Plan of Care Patient           Patient will benefit from skilled therapeutic intervention in order to improve the following deficits and impairments:  Decreased activity tolerance, Decreased range of motion, Decreased strength, Increased fascial restricitons, Increased muscle spasms, Impaired flexibility, Postural dysfunction, Pain  Visit Diagnosis: Chronic bilateral low back pain, unspecified whether sciatica present  Muscle weakness (generalized)  Pain in thoracic spine  Pain in right hip     Problem List Patient Active Problem List   Diagnosis Date Noted  . Thoracic disc disease 04/19/2019  . Myofascial pain syndrome 04/19/2019  . Idiopathic peripheral neuropathy 02/07/2019    . Acquired hypothyroidism 02/03/2018  . Hormone replacement therapy (HRT) 02/03/2018  . Underweight 02/03/2018  . GERD (gastroesophageal reflux disease)   . Postural kyphosis of thoracic region 03/16/2017  . Neck pain 11/20/2016  . Chronic right-sided thoracic back pain 08/03/2016  . Celiac disease 07/20/2011  . Dietary iron deficiency - malabsorption 07/20/2011  . Chronic idiopathic monocytosis 07/20/2011    BDebbe Odea PT,DPT 08/18/2019, 8:55 AM  [facility] [address] [address]Phone: [phone]  Fax:  [phone]  Name: Marilyn Collins MRN: [id number] Date of Birth: [birth year]

## 2019-08-21 MED FILL — CEQUA 0.09 % SOLN: 0.09 | 90 days supply | Qty: 180 | Fill #1

## 2019-08-22 ENCOUNTER — Other Ambulatory Visit: Payer: Self-pay

## 2019-08-22 ENCOUNTER — Ambulatory Visit: Payer: 59 | Admitting: Physical Therapy

## 2019-08-22 ENCOUNTER — Encounter: Payer: Self-pay | Admitting: Physical Therapy

## 2019-08-22 DIAGNOSIS — M545 Low back pain, unspecified: Secondary | ICD-10-CM

## 2019-08-22 DIAGNOSIS — M6281 Muscle weakness (generalized): Secondary | ICD-10-CM | POA: Diagnosis not present

## 2019-08-22 DIAGNOSIS — M546 Pain in thoracic spine: Secondary | ICD-10-CM

## 2019-08-22 DIAGNOSIS — M25551 Pain in right hip: Secondary | ICD-10-CM | POA: Diagnosis not present

## 2019-08-22 DIAGNOSIS — G8929 Other chronic pain: Secondary | ICD-10-CM

## 2019-08-22 NOTE — Therapy (Signed)
[place], [place], [address] Phone: [phone]   Fax:  [phone]  Physical Therapy Treatment  Patient Details  Name: Marilyn Collins MRN: [id number] Date of Birth: [birth year] Referring Provider (PT): Ernestina Patches, MD   Encounter Date: 08/22/2019   PT End of Session - 08/22/19 1305    Visit Number 3    Number of Visits 12    Date for PT Re-Evaluation 09/15/19    Authorization Type [facility]    PT Start Time 1145    PT Stop Time 1230    PT Time Calculation (min) 45 min    Activity Tolerance Patient tolerated treatment well    Behavior During Therapy [facility] for tasks assessed/performed           Past Medical History:  Diagnosis Date  . Acquired hypothyroidism 02/03/2018  . Anxiety   . Arthritis   . Celiac disease 07/20/2011  . Chronic idiopathic monocytosis 07/20/2011  . Dysphagia 12/2012   responsive to dexilant  . GERD (gastroesophageal reflux disease)   . H/O eating disorder   . Hypothyroidism   . Iron deficiency anemia 07/20/2011  . Iron deficiency anemia, unspecified 07/20/2011  . Migraine headache   . Other specified intestinal malabsorption 07/20/2011    Past Surgical History:  Procedure Laterality Date  . ABDOMINAL HYSTERECTOMY    . COLONOSCOPY  06/2003   neg  . DIAGNOSTIC LAPAROSCOPY    . ESOPHAGOGASTRODUODENOSCOPY N/A 10/20/2012   Procedure: ESOPHAGOGASTRODUODENOSCOPY (EGD);  Surgeon: Cleotis Nipper, MD;  Location: [facility];  Service: Endoscopy;  Laterality: N/A;  . FLEXIBLE SIGMOIDOSCOPY  2005  . SINUS IRRIGATION    . TMJ ARTHROPLASTY      There were no vitals filed for this visit.   Subjective Assessment - 08/22/19 1206    Subjective relays back pain is overall better but the pain is concentrated to her Rt hip/ischium today    How long can you sit comfortably? depends    How long can you stand comfortably? depends    How long can you walk comfortably? depends    Diagnostic tests Imaging: MRI  from 2013 showing small posterior lateral disc protrusion at L4-5 with mild facet arthropathy but without any listhesis or stenosis. XR 07/2019 show "facet arthropathy $-5 and inc lumbar lordosis"    Patient Stated Goals decrease pain    Pain Onset More than a month ago               [facility] Adult PT Treatment/Exercise - 08/22/19 0001      Lumbar Exercises: Stretches   Double Knee to Chest Stretch --    Piriformis Stretch Right;3 reps;30 seconds    Other Lumbar Stretch Exercise --      Lumbar Exercises: Supine   Single Leg Bridge --    Bridge with [employer] Limitations --    Other Supine Lumbar Exercises isometric hip MET techniques 5 sec holds X 10 reps for flexion, extension, abd, add      Traction   Min (lbs) 40    Max (lbs) 50   she weighs 95 lbs   Hold Time 60    Rest Time 20    Time 20      Manual Therapy   Manual therapy comments long axis distraction Rt leg grade 3-5, posterior hip mobs                       PT Long Term Goals -  08/04/19 0916      PT LONG TERM GOAL #1   Title independent with HEP. Target for all goals (09/15/19)    Time 6    Status New    Target Date 09/15/19      PT LONG TERM GOAL #2   Title Improve hip strength to at least 5-/5 to improve function and improve lumbar/pelvic stability    Time 6    Period Weeks    Status New      PT LONG TERM GOAL #3   Title perform thoracolumbar ROM without increase in pain for improved mobility    Time 6    Period Weeks    Status New      PT LONG TERM GOAL #4   Title tolerate full work day without increase in pain for improved activity tolerance    Time 6    Period Weeks    Status New                 Plan - 08/22/19 1306    Clinical Impression Statement Pain localized to Rt SI joint area today so trialed with manual techniques and SI muscle energy techniques for pelvis reset and stabiility. She does relays less pain after session and was encouraged to try MET techniques at  home. Continued with mechanical traction for spinal decompression.    Personal Factors and Comorbidities Time since onset of injury/illness/exacerbation;Comorbidity 1;Comorbidity 2    Comorbidities chronic neck/back pain, underweight, celiac, OA    Examination-Activity Limitations Bend;Carry;Squat;Stairs;Stand;Lift    Examination-Participation Restrictions Cleaning;Community Activity;Driving;Laundry;Yard Work;Shop    Stability/Clinical Decision Making Evolving/Moderate complexity    Rehab Potential Good    PT Frequency 2x / week   1-2   PT Duration 6 weeks    PT Treatment/Interventions ADLs/Self Care Home Management;Cryotherapy;Electrical Stimulation;Iontophoresis 20m/ml Dexamethasone;Moist Heat;Traction;Ultrasound;Therapeutic activities;Therapeutic exercise;Neuromuscular re-education;Manual techniques;Patient/family education;Passive range of motion;Dry needling;Joint Manipulations;Spinal Manipulations;Taping    PT Next Visit Plan how was traction? how was MET techniques at home  needs hip/glute and core strength, address her lumbar lordosis    PT Home Exercise Plan Access Code: [email or web address], added SL bridges and bird dogs    Consulted and Agree with Plan of Care Patient           Patient will benefit from skilled therapeutic intervention in order to improve the following deficits and impairments:  Decreased activity tolerance, Decreased range of motion, Decreased strength, Increased fascial restricitons, Increased muscle spasms, Impaired flexibility, Postural dysfunction, Pain  Visit Diagnosis: Chronic bilateral low back pain, unspecified whether sciatica present  Muscle weakness (generalized)  Pain in thoracic spine  Pain in right hip     Problem List Patient Active Problem List   Diagnosis Date Noted  . Thoracic disc disease 04/19/2019  . Myofascial pain syndrome 04/19/2019  . Idiopathic peripheral neuropathy 02/07/2019  . Acquired hypothyroidism 02/03/2018  . Hormone  replacement therapy (HRT) 02/03/2018  . Underweight 02/03/2018  . GERD (gastroesophageal reflux disease)   . Postural kyphosis of thoracic region 03/16/2017  . Neck pain 11/20/2016  . Chronic right-sided thoracic back pain 08/03/2016  . Celiac disease 07/20/2011  . Dietary iron deficiency - malabsorption 07/20/2011  . Chronic idiopathic monocytosis 07/20/2011    BSilvestre Mesi7/20/2021, 1:08 PM  [facility] [address]G[place]­ [place] [address]Phone: [phone]  Fax:  [phone] Name: JTARIYA MORRISSETTEMRN: [id number] of Birth: [birth year]

## 2019-08-29 ENCOUNTER — Encounter: Payer: 59 | Admitting: Physical Therapy

## 2019-09-04 ENCOUNTER — Other Ambulatory Visit (HOSPITAL_COMMUNITY): Payer: Self-pay | Admitting: Nurse Practitioner

## 2019-09-06 ENCOUNTER — Other Ambulatory Visit (HOSPITAL_COMMUNITY): Payer: Self-pay | Admitting: Nurse Practitioner

## 2019-09-06 MED FILL — DEXILANT DR 60 MG CAPSULE: 60 | 90 days supply | Qty: 90 | Fill #0

## 2019-09-12 ENCOUNTER — Ambulatory Visit: Payer: 59 | Admitting: Physical Therapy

## 2019-09-12 ENCOUNTER — Other Ambulatory Visit: Payer: Self-pay

## 2019-09-12 DIAGNOSIS — M545 Low back pain, unspecified: Secondary | ICD-10-CM

## 2019-09-12 DIAGNOSIS — G8929 Other chronic pain: Secondary | ICD-10-CM

## 2019-09-12 DIAGNOSIS — M25551 Pain in right hip: Secondary | ICD-10-CM

## 2019-09-12 DIAGNOSIS — M546 Pain in thoracic spine: Secondary | ICD-10-CM

## 2019-09-12 DIAGNOSIS — M6281 Muscle weakness (generalized): Secondary | ICD-10-CM | POA: Diagnosis not present

## 2019-09-12 MED FILL — LEVOTHYROXINE SODIUM 125 MC: 125 | 84 days supply | Qty: 36 | Fill #0

## 2019-09-12 NOTE — Therapy (Addendum)
[facility] [address] [place], [place], [address] Phone: [phone]   Fax:  [phone]  Physical Therapy Treatment/Discharge addendum PHYSICAL THERAPY DISCHARGE SUMMARY  Visits from Start of Care: 4  Current functional level related to goals / functional outcomes: See below   Remaining deficits: See below   Education / Equipment: See below Plan: Patient agrees to discharge.  Patient goals were not met. Patient is being discharged due to not returning since the last visit.  ?????  Now following MD for her pain. Elsie Ra, PT, DPT 11/21/19 8:29 AM      Patient Details  Name: Marilyn Collins MRN: [id number] Date of Birth: 1955/04/09 Referring Provider (PT): Ernestina Patches, MD   Encounter Date: 09/12/2019   PT End of Session - 09/12/19 1148    Visit Number 4    Number of Visits 12    Date for PT Re-Evaluation 09/15/19    Authorization Type [facility]    PT Start Time 1100    PT Stop Time 1140    PT Time Calculation (min) 40 min    Activity Tolerance Patient tolerated treatment well    Behavior During Therapy [facility] for tasks assessed/performed           Past Medical History:  Diagnosis Date  . Acquired hypothyroidism 02/03/2018  . Anxiety   . Arthritis   . Celiac disease 07/20/2011  . Chronic idiopathic monocytosis 07/20/2011  . Dysphagia 12/2012   responsive to dexilant  . GERD (gastroesophageal reflux disease)   . H/O eating disorder   . Hypothyroidism   . Iron deficiency anemia 07/20/2011  . Iron deficiency anemia, unspecified 07/20/2011  . Migraine headache   . Other specified intestinal malabsorption 07/20/2011    Past Surgical History:  Procedure Laterality Date  . ABDOMINAL HYSTERECTOMY    . COLONOSCOPY  06/2003   neg  . DIAGNOSTIC LAPAROSCOPY    . ESOPHAGOGASTRODUODENOSCOPY N/A 10/20/2012   Procedure: ESOPHAGOGASTRODUODENOSCOPY (EGD);  Surgeon: Cleotis Nipper, MD;  Location: [facility];  Service: Endoscopy;   Laterality: N/A;  . FLEXIBLE SIGMOIDOSCOPY  2005  . SINUS IRRIGATION    . TMJ ARTHROPLASTY      There were no vitals filed for this visit.   Subjective Assessment - 09/12/19 1129    Subjective relays overall her back is doing better but is having "screaming pain down her hamstrings today"    How long can you sit comfortably? depends    How long can you stand comfortably? depends    How long can you walk comfortably? depends    Diagnostic tests Imaging: MRI from 2013 showing small posterior lateral disc protrusion at L4-5 with mild facet arthropathy but without any listhesis or stenosis. XR 07/2019 show "facet arthropathy $-5 and inc lumbar lordosis"    Patient Stated Goals decrease pain    Pain Onset More than a month ago              [facility] Adult PT Treatment/Exercise - 09/12/19 0001      Lumbar Exercises: Stretches   Active Hamstring Stretch Right;Left;3 reps;30 seconds    Single Knee to Chest Stretch Right;Left;2 reps;30 seconds      Lumbar Exercises: Standing   Other Standing Lumbar Exercises SL deadlift 5 lbs with one UE support X 10 reps bilat      Lumbar Exercises: Seated   Other Seated Lumbar Exercises H.S. curl red band  X20 reps bilat      Lumbar Exercises: Supine   Bridge  with clamshell 20 reps   Blue   Single Leg Bridge 10 reps;5 seconds    Bridge with [employer] Limitations 2 sets, bilat      Manual Therapy   Manual therapy comments MET contract relax to hamstrings, manual H.S. stretching, IASTM to bilat hamstrings with tool and with percussive device                  PT Education - 09/12/19 1148    Education Details additional H.S. exericises, percussion gun    Person(s) Educated Patient    Methods Explanation    Comprehension Verbalized understanding               PT Long Term Goals - 09/12/19 1151      PT LONG TERM GOAL #1   Title independent with HEP. Target for all goals (09/15/19)    Time 6    Status Achieved      PT LONG TERM  GOAL #2   Title Improve hip strength to at least 5-/5 to improve function and improve lumbar/pelvic stability    Time 6    Period Weeks    Status On-going      PT LONG TERM GOAL #3   Title perform thoracolumbar ROM without increase in pain for improved mobility    Time 6    Period Weeks    Status Achieved      PT LONG TERM GOAL #4   Title tolerate full work day without increase in pain for improved activity tolerance    Time 6    Period Weeks    Status On-going                 Plan - 09/12/19 1149    Clinical Impression Statement Her back has improved and was not having pain, although her hamstrings were very tight and painful today. Session focused on light exercise, stretching, and manual therapy for her hamstrings. She relays she feels much better after session. She will trial new HEP at home and will return to PT PRN.    Personal Factors and Comorbidities Time since onset of injury/illness/exacerbation;Comorbidity 1;Comorbidity 2    Comorbidities chronic neck/back pain, underweight, celiac, OA    Examination-Activity Limitations Bend;Carry;Squat;Stairs;Stand;Lift    Examination-Participation Restrictions Cleaning;Community Activity;Driving;Laundry;Yard Work;Shop    Stability/Clinical Decision Making Evolving/Moderate complexity    Rehab Potential Good    PT Frequency 2x / week   1-2   PT Duration 6 weeks    PT Treatment/Interventions ADLs/Self Care Home Management;Cryotherapy;Electrical Stimulation;Iontophoresis 49m/ml Dexamethasone;Moist Heat;Traction;Ultrasound;Therapeutic activities;Therapeutic exercise;Neuromuscular re-education;Manual techniques;Patient/family education;Passive range of motion;Dry needling;Joint Manipulations;Spinal Manipulations;Taping    PT Next Visit Plan will hold PT for her to trial HEP, will come back if she needs to    PT Home Exercise Plan Access Code: [email or web address], added SL bridges and bird dogs, SL deadlift    Consulted and Agree with Plan of  Care Patient           Patient will benefit from skilled therapeutic intervention in order to improve the following deficits and impairments:  Decreased activity tolerance, Decreased range of motion, Decreased strength, Increased fascial restricitons, Increased muscle spasms, Impaired flexibility, Postural dysfunction, Pain  Visit Diagnosis: Chronic bilateral low back pain, unspecified whether sciatica present  Muscle weakness (generalized)  Pain in thoracic spine  Pain in right hip     Problem List Patient Active Problem List   Diagnosis Date Noted  . Thoracic disc disease 04/19/2019  . Myofascial pain syndrome 04/19/2019  .  Idiopathic peripheral neuropathy 02/07/2019  . Acquired hypothyroidism 02/03/2018  . Hormone replacement therapy (HRT) 02/03/2018  . Underweight 02/03/2018  . GERD (gastroesophageal reflux disease)   . Postural kyphosis of thoracic region 03/16/2017  . Neck pain 11/20/2016  . Chronic right-sided thoracic back pain 08/03/2016  . Celiac disease 07/20/2011  . Dietary iron deficiency - malabsorption 07/20/2011  . Chronic idiopathic monocytosis 07/20/2011    Silvestre Mesi 09/12/2019, 11:51 AM  [facility] [address] [place], [place], [address] Phone: [phone]   Fax:  [phone]  Name: Marilyn Collins MRN: [id number] Date of Birth: 07-22-1955

## 2019-10-03 ENCOUNTER — Ambulatory Visit: Payer: 59 | Admitting: Physical Medicine and Rehabilitation

## 2019-10-03 ENCOUNTER — Other Ambulatory Visit: Payer: Self-pay

## 2019-10-03 ENCOUNTER — Encounter: Payer: Self-pay | Admitting: Physical Medicine and Rehabilitation

## 2019-10-03 VITALS — BP 106/64 | HR 68

## 2019-10-03 DIAGNOSIS — M47816 Spondylosis without myelopathy or radiculopathy, lumbar region: Secondary | ICD-10-CM | POA: Diagnosis not present

## 2019-10-03 DIAGNOSIS — G8929 Other chronic pain: Secondary | ICD-10-CM | POA: Diagnosis not present

## 2019-10-03 DIAGNOSIS — M898X1 Other specified disorders of bone, shoulder: Secondary | ICD-10-CM | POA: Diagnosis not present

## 2019-10-03 DIAGNOSIS — M7918 Myalgia, other site: Secondary | ICD-10-CM | POA: Diagnosis not present

## 2019-10-03 DIAGNOSIS — M546 Pain in thoracic spine: Secondary | ICD-10-CM | POA: Diagnosis not present

## 2019-10-03 DIAGNOSIS — M4316 Spondylolisthesis, lumbar region: Secondary | ICD-10-CM | POA: Diagnosis not present

## 2019-10-03 DIAGNOSIS — M542 Cervicalgia: Secondary | ICD-10-CM | POA: Diagnosis not present

## 2019-10-03 MED ORDER — BACLOFEN 10 MG PO TABS: ORAL_TABLET | ORAL | 0 refills | Status: DC

## 2019-10-03 MED FILL — BACLOFEN 10 MG TABS: 10 | 20 days supply | Qty: 60 | Fill #0

## 2019-10-03 NOTE — Progress Notes (Signed)
Marilyn Collins - 64 y.o. female MRN [id number]  Date of birth: Nov 25, 1955  Office Visit Note: Visit Date: 10/03/2019 PCP: [facility] [place] Referred by: [facility]  Subjective: Chief Complaint  Patient presents with  . Neck - Pain  . Lower Back - Pain   HPI: Marilyn Collins is a 64 y.o. female who comes in today Continued evaluation and management for 2 distinct issues in terms of body locations.  She has chronic pain syndrome in general.  Her biggest complaint again is left neck and shoulder blade pain which is chronic history for her along with thoracic pain.  This is mainly at least to my eyes mostly myofascial pain syndrome with fairly significant trigger points.  On occasion we have completed trigger point injection treatment into the taut bands and trigger points that seem to be active and this has given her some relief for a few months at a time.  Last time we saw the patient was in June and that can be reviewed.  She has not noted any new paresthesias or radicular complaints or focal weakness etc.  Her case is complicated by diagnosis of hypothyroidism as well as idiopathic peripheral neuropathy and being underweight.  We discussed today at length myofascial pain syndrome once again.  She did have a history of facet joint blocks of the thoracic spine which did seem to help to a degree.  This was more in the mid back.  She is still getting some right-sided thoracic pain.  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  Secondary problem noted last time has been right lower back pain with some referral into the hip and leg down to the knee without paresthesia.  No left-sided complaints.  Again please see prior notes for review.  We did send the patient for focused physical therapy of this.  No dry needling was performed.  She reports while they were doing the therapy it did seem to help quite a bit but now it seems to be just coming back with a  vengeance.  X-rays obtained at the last visit did show facet arthropathy with small retrolisthesis of L4 on L5.  Prior MRI of the lumbar spine was in 2013 did not show any high-grade stenosis or nerve compression.  Review of Systems  Musculoskeletal: Positive for back pain, joint pain and neck pain.  All other systems reviewed and are negative.  Otherwise per HPI.  Assessment & Plan: Visit Diagnoses:  1. Cervicalgia   2. Pain of left scapula   3. Chronic right-sided thoracic back pain   4. Myofascial pain syndrome   5. Spondylosis without myelopathy or radiculopathy, lumbar region   6. Spondylolisthesis of lumbar region     Plan: Findings:  1.  Chronic worsening severe left neck and shoulder and shoulder blade pain.  Significant trigger points in the scalene as well as trapezius and rhomboid and thoracic paraspinal.  I really do believe most of her pain is myofascial in nature.  MRI of the thoracic spine is not really revealed any stenosis or disc herniations that are worrisome.  She does have some degenerative changes throughout the thoracic spine was increased kyphosis and degenerative changes.  She has done well with intermittent trigger point injection.  We have discussed really at length probably to the point of all knowledge I know about trigger points but they are clearly there with her on palpation and they clearly  reproduce a lot of her pain.  We will do trigger point injections today.  She has had deep tissue massage in the past but did not tolerate that.  She has used a muscle relaxer like Zanaflex at night and that is helped to a degree but she cannot take it during the day.  I will prescribe baclofen as a trial that she might try during the day and then the tizanidine at night.  This will be an ongoing management of these trigger points unfortunately.  She may wish at some point to regroup with a physical therapist for more dry needling of multiple places.  2.  Chronic worsening low  back pain with right radicular type pain but not quite radicular in terms of where it goes and I think it may be facet mediated referral pattern.  MRI did not show any focal problems in the past but that is an old MRI.  Newer x-ray does show arthropathy at L4-5 and L5-S1.  I think completing diagnostic facet joint block at L4-5 and L5-S1 would go a long way to tell us how much pain she was having from the joints.  If it just did not help at all that I do think she needs an updated MRI of the lumbar spine.  She will continue with exercises learned at physical therapy.    Meds & Orders:  Meds ordered this encounter  Medications  . baclofen (LIORESAL) 10 MG tablet    Sig: Take 1/2 to 1 by mouth every 8hrs as needed for spasm    Dispense:  60 tablet    Refill:  0    Orders Placed This Encounter  Procedures  . Trigger Point Inj    Follow-up: Return for Left L4-5 and L5-S1 facet block.   Procedures: Trigger Point Inj  Date/Time: 10/03/2019 10:32 AM Performed by: Magnus Sinning, MD Authorized by: Magnus Sinning, MD   Consent Given by:  Patient Site marked: the procedure site was marked   Timeout: prior to procedure the correct patient, procedure, and site was verified   Total # of Trigger Points:  3 or more Location: neck and back   Needle Size:  25 G Approach:  Dorsal Medications #1:  20 mg triamcinolone acetonide 40 MG/ML Medications #2:  3 mL lidocaine 1 % Additional Injections?: No   Patient tolerance:  Patient tolerated the procedure well with no immediate complications Comments: Trapezius, Scalen, Rhomboid and thoracic multifidi    No notes on file   Clinical History: 4 view lumbar spine 07/18/2019 AP and lateral lumbar spine shows very mild thoracolumbar scoliosis  centered about T11. She does have a transitional segment with what will  termed a lumbarized S1 segment with full ribs at T12. Given that  numbering scheme she does have facet arthropathy at L4-5 and L5-S1  with  may be small retrolisthesis of L4 on L5. She has good maintained disc  heights. She has increased lumbar lordosis. Hips can be seen bilaterally  without any arthritic changes. -----  MRI lumbar spine 2013  L4-L5: Disc desiccation and a small posterolateral disc protrusion  on the right are chronic and unchanged. There is mild bilateral  facet hypertrophy. No foraminal compromise or nerve root  encroachment is present.   L5-S1: Disc height and hydration are maintained. There is mild  bilateral facet hypertrophy without resulting foraminal compromise  or nerve root encroachment.   IMPRESSION:   1. Stable small posterolateral disc protrusion on the right at L4-  L5. No resulting spinal stenosis or nerve root encroachment.  2. Mild facet disease inferiorly.  3. No acute disc space findings, significant spinal stenosis or  nerve root encroachment.  4. 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 Correlate clinically.   Original Report Authenticated By: Vivia Ewing, M.D.  ---   MRI THORACIC SPINE WITHOUT CONTRAST    TECHNIQUE:  Multiplanar, multisequence MR imaging of the thoracic spine was  performed. No intravenous contrast was administered.    COMPARISON: Thoracic spine MRI 05/04/2005.    FINDINGS:  Limited cervical spine imaging: Partially visible C6-C7 disc space  loss and disc herniation on series 4, image 6.    Thoracic spine segmentation: Appears normal. The same numbering  system is used today as on the 2007 MRI.    Alignment: Stable thoracic vertebral height and alignment since  2007. Mild straightening of lower thoracic kyphosis.    Vertebrae: Several benign chronic vertebral body hemangiomas in the  lower thoracic spine are stable since 2007, most notable at T11 and  T12. Underlying bone marrow signal remains within normal limits.  Mild chronic endplate irregularity inferiorly at T10,  and at T11-T12  is stable since 2007. No marrow edema or evidence of acute osseous  abnormality.    Cord: Spinal cord signal is within normal limits at all visualized  levels. Capacious thoracic spinal canal. Conus medullaris occurs  below the T12-L1 level and is not visible.    Paraspinal and other soft tissues: Negative visualized thoracic  inlet and thoracic viscera. Negative visualized posterior paraspinal  soft tissues.    Disc levels:    T1-T2: Negative.    T2-T3: Mild facet hypertrophy. No stenosis.    T3-T4: Borderline to mild facet hypertrophy. No stenosis.    T4-T5: Negative.    T5-T6: Negative.    T6-T7: Subtle central disc protrusion is new since 2007 but only  faintly visible (series 4, image 7). No stenosis.    T7-T8: Small lobulated left greater than right paracentral disc  protrusions are new or increased since 2007, best seen on series 8,  image 23. No associated stenosis.    T8-T9: Subtle if any left paracentral disc protrusion. No stenosis.    T9-T10: Mild facet hypertrophy. No stenosis.    T10-T11: Minimal disc bulge. Mild facet and ligament flavum  hypertrophy. No stenosis.    T11-T12: Minimal disc bulge. No stenosis.    T12-L1: Negative.    IMPRESSION:  1. Largely stable MRI appearance of the thoracic spine since 2007.  No acute or interval osseous abnormality.  2. Mild for age thoracic spine degeneration and capacious underlying  thoracic spinal canal. Subtle disc protrusions from T6-T7 to T8-T9  are new or have increased since 2007, but results in no spinal  stenosis or neural impingement.      Electronically Signed  By: Genevie Ann M.D.  On: 01/28/2017 15:25   She reports that she has never smoked. She has never used smokeless tobacco. No results for input(s): HGBA1C, LABURIC in the last 8760 hours.  Objective:  VS:  HT:    WT:   BMI:     BP:106/64  HR:68bpm  TEMP: ( )  RESP:  Physical Exam Vitals and  nursing note reviewed.  Constitutional:      General: She is not in acute distress.    Appearance: Normal appearance. She is well-developed. She is not ill-appearing.     Comments: Very thin  HENT:  Head: Normocephalic and atraumatic.  Eyes:     Conjunctiva/sclera: Conjunctivae normal.     Pupils: Pupils are equal, round, and reactive to light.  Cardiovascular:     Rate and Rhythm: Normal rate.     Pulses: Normal pulses.  Pulmonary:     Effort: Pulmonary effort is normal.  Musculoskeletal:     Cervical back: Neck supple. Tenderness present. No rigidity.     Right lower leg: No edema.     Left lower leg: No edema.     Comments: Examination of the cervical and thoracic spine shows no scoliosis but some increased lumbar kyphosis.  Patient has very active and present trigger points really in the upper scalene and the left trapezius and rhomboid region as well as right sided paraspinal musculature.  She has latent trigger points in different areas as well.  No tender points.  Shoulder range of motion is pretty good with mild impingement sign.  She has good strength in the lower extremities.  She does have pain with extension and loading of the facet joints of the lumbar spine on the right.  No focal trigger points of the lower spine at least noted.  Lymphadenopathy:     Cervical: No cervical adenopathy.  Skin:    General: Skin is warm and dry.     Findings: No erythema or rash.  Neurological:     General: No focal deficit present.     Mental Status: She is alert and oriented to person, place, and time.     Sensory: No sensory deficit.     Motor: No abnormal muscle tone.     Coordination: Coordination normal.     Gait: Gait normal.  Psychiatric:        Mood and Affect: Mood normal.        Behavior: Behavior normal.     Ortho Exam  Imaging: No results found.  Past Medical/Family/Surgical/Social History: Medications & Allergies reviewed per EMR, new medications updated. Patient  Active Problem List   Diagnosis Date Noted  . Thoracic disc disease 04/19/2019  . Myofascial pain syndrome 04/19/2019  . Idiopathic peripheral neuropathy 02/07/2019  . Acquired hypothyroidism 02/03/2018  . Hormone replacement therapy (HRT) 02/03/2018  . Underweight 02/03/2018  . GERD (gastroesophageal reflux disease)   . Postural kyphosis of thoracic region 03/16/2017  . Neck pain 11/20/2016  . Chronic right-sided thoracic back pain 08/03/2016  . Celiac disease 07/20/2011  . Dietary iron deficiency - malabsorption 07/20/2011  . Chronic idiopathic monocytosis 07/20/2011   Past Medical History:  Diagnosis Date  . Acquired hypothyroidism 02/03/2018  . Anxiety   . Arthritis   . Celiac disease 07/20/2011  . Chronic idiopathic monocytosis 07/20/2011  . Dysphagia 12/2012   responsive to dexilant  . GERD (gastroesophageal reflux disease)   . H/O eating disorder   . Hypothyroidism   . Iron deficiency anemia 07/20/2011  . Iron deficiency anemia, unspecified 07/20/2011  . Migraine headache   . Other specified intestinal malabsorption 07/20/2011   Family History  Problem Relation Age of Onset  . Cancer Mother   . Heart disease Mother   . Heart disease Father   . Hyperlipidemia Father   . Hypertension Father   . Stroke Father   . Hypertension Brother   . Cancer Maternal Grandmother   . Heart disease Paternal Grandmother   . Stroke Maternal Grandfather    Past Surgical History:  Procedure Laterality Date  . ABDOMINAL HYSTERECTOMY    . COLONOSCOPY  06/2003  neg  . DIAGNOSTIC LAPAROSCOPY    . ESOPHAGOGASTRODUODENOSCOPY N/A 10/20/2012   Procedure: ESOPHAGOGASTRODUODENOSCOPY (EGD);  Surgeon: Cleotis Nipper, MD;  Location: [facility];  Service: Endoscopy;  Laterality: N/A;  . FLEXIBLE SIGMOIDOSCOPY  2005  . SINUS IRRIGATION    . TMJ ARTHROPLASTY     Social History   Occupational History  . Occupation: Stow Programmer, multimedia: [employer]  Tobacco Use  . Smoking status: Never  Smoker  . Smokeless tobacco: Never Used  Vaping Use  . Vaping Use: Never used  Substance and Sexual Activity  . Alcohol use: No    Alcohol/week: 0.0 standard drinks  . Drug use: No  . Sexual activity: Yes    Birth control/protection: Surgical

## 2019-10-03 NOTE — Progress Notes (Signed)
Pain on left side of neck and left shoulder. Low back pain with right buttock pain and pain down to right knee. PT for low back was helping for a little while, but pain has been back over the last few days. Numeric Pain Rating Scale and Functional Assessment Average Pain 6   In the last MONTH (on 0-10 scale) has pain interfered with the following?  1. General activity like being  able to carry out your everyday physical activities such as walking, climbing stairs, carrying groceries, or moving a chair?  Rating(5)

## 2019-10-04 ENCOUNTER — Encounter: Payer: Self-pay | Admitting: Physical Medicine and Rehabilitation

## 2019-10-04 MED ORDER — TRIAMCINOLONE ACETONIDE 40 MG/ML IJ SUSP
20.0000 mg | INTRAMUSCULAR | Status: AC | PRN
  Administered 2019-10-03: 20 mg via INTRAMUSCULAR

## 2019-10-04 MED ORDER — LIDOCAINE HCL 1 % IJ SOLN
3.0000 mL | INTRAMUSCULAR | Status: AC | PRN
  Administered 2019-10-03: 3 mL

## 2019-10-23 ENCOUNTER — Other Ambulatory Visit (HOSPITAL_COMMUNITY): Payer: Self-pay | Admitting: Nurse Practitioner

## 2019-10-23 MED FILL — ESTRADIOL 0.1 MG PATCH: 0.1 | 84 days supply | Qty: 24 | Fill #0

## 2019-10-24 ENCOUNTER — Ambulatory Visit: Payer: 59 | Admitting: Physical Medicine and Rehabilitation

## 2019-10-24 ENCOUNTER — Other Ambulatory Visit: Payer: Self-pay

## 2019-10-24 ENCOUNTER — Ambulatory Visit: Payer: Self-pay

## 2019-10-24 VITALS — BP 110/64 | HR 60

## 2019-10-24 DIAGNOSIS — R102 Pelvic and perineal pain: Secondary | ICD-10-CM | POA: Diagnosis not present

## 2019-10-24 DIAGNOSIS — M47816 Spondylosis without myelopathy or radiculopathy, lumbar region: Secondary | ICD-10-CM

## 2019-10-24 DIAGNOSIS — M5441 Lumbago with sciatica, right side: Secondary | ICD-10-CM

## 2019-10-24 DIAGNOSIS — M25551 Pain in right hip: Secondary | ICD-10-CM

## 2019-10-24 DIAGNOSIS — G8929 Other chronic pain: Secondary | ICD-10-CM

## 2019-10-24 MED ORDER — METHYLPREDNISOLONE ACETATE 80 MG/ML IJ SUSP: 80.0000 mg | Freq: Once | INTRAMUSCULAR | Status: DC

## 2019-10-24 NOTE — Progress Notes (Signed)
Pt states lower back pain that travels down her right buttock. Pt state bending makes the pain worse. Pt state if she has to pull on door she feels pain. Pt state pain meds and messages helps with easing the pain.  Numeric Pain Rating Scale and Functional Assessment Average Pain 7   In the last MONTH (on 0-10 scale) has pain interfered with the following?  1. General activity like being  able to carry out your everyday physical activities such as walking, climbing stairs, carrying groceries, or moving a chair?  Rating(8)   +Driver, -BT, -Dye Allergies.

## 2019-10-30 NOTE — Progress Notes (Signed)
Marilyn Collins - 64 y.o. female MRN [id number]  Date of birth: 1955-09-10  Office Visit Note: Visit Date: 10/24/2019 PCP: [facility], [place] Referred by: [facility]  Subjective: Chief Complaint  Patient presents with  . Lower Back - Pain  . Right Hip - Pain  . Pelvic Pain   HPI: Marilyn Collins is a 64 y.o. female who comes in today For evaluation management of severe worsening right low back and right buttock pain.  She rates her pain as a 7 out of 10.  Her back pain actually has gotten much better since I last saw her.  We actually had her coming in today for facet joint block diagnostically.  She has had these in the thoracic spine and done quite well.  She also has a history of pretty significant myofascial pain syndrome with multiple trigger points that have done well with trigger point injections and activity modification and those notes can be fully reviewed.  She has had recent MRI of her thoracic spine but has not had recent MRI of her lumbar spine.  Last MRI of the lumbar spine was in 2013 and this did not show any worrisome signs or pathology.  Now she has this new onset worsening buttock pain that could be radicular in nature.  She is a very thin individual with this myofascial pain secondary diagnoses could be sacroiliac joint mediated pain.  She has never had MRI of the pelvis.  A lot of her pain seems to be through the pelvis and even in through the perineal area.  No real loss of sensation but somewhat of a paresthesia feeling.  This is somewhat bilateral but worse on the right buttock.  She has not had any specific physical therapy for pelvic pain.  No real history of chronic pelvic pain but history of chronic musculoskeletal pain in general.  She is getting no other radicular symptoms down the legs.  No focal weakness no specific trauma.  Review of Systems  Musculoskeletal: Positive for back pain and joint pain.  All other systems reviewed and are  negative.  Otherwise per HPI.  Assessment & Plan: Visit Diagnoses:  1. Spondylosis without myelopathy or radiculopathy, lumbar region   2. Chronic bilateral low back pain with right-sided sciatica   3. Pain in right hip   4. Pelvic pain     Plan: Findings:  Chronic worsening severe right buttock pain and generalized pelvic pain which may just be more related to the structural components with tendons and sacroiliac joint in general but worrisome for the fact that it just is continuing and worsening.  No frank radicular pain down the legs but into the right buttock could be radicular.  Her back pain is actually better and I feel like at this point not doing the diagnostic facet blocks but looking at MRI of the pelvis.  I feel like she needs an MRI of the pelvis as she has never had 1 and she is getting this symptomatology that is just not totally consistent with just the lumbar spine issue.  I'm sure the MRI of the pelvis will probably show is the lower 2 lumbar areas.  We will get this arranged and scheduled.  She'll follow up after MRI reviewed.  Other concerns would be sacral insufficiency fracture in someone with such little body weight.    Meds & Orders:  Meds ordered this encounter  Medications  . DISCONTD: methylPREDNISolone acetate (DEPO-MEDROL) injection 80  mg    Orders Placed This Encounter  Procedures  . MR PELVIS WO CONTRAST    Follow-up: Return for MRI review after completion.   Procedures: No procedures performed  No notes on file   Clinical History: 4 view lumbar spine 07/18/2019 AP and lateral lumbar spine shows very mild thoracolumbar scoliosis  centered about T11. She does have a transitional segment with what will  termed a lumbarized S1 segment with full ribs at T12. Given that  numbering scheme she does have facet arthropathy at L4-5 and L5-S1 with  may be small retrolisthesis of L4 on L5. She has good maintained disc  heights. She has increased lumbar  lordosis. Hips can be seen bilaterally  without any arthritic changes. -----  MRI lumbar spine 2013  L4-L5: Disc desiccation and a small posterolateral disc protrusion  on the right are chronic and unchanged. There is mild bilateral  facet hypertrophy. No foraminal compromise or nerve root  encroachment is present.   L5-S1: Disc height and hydration are maintained. There is mild  bilateral facet hypertrophy without resulting foraminal compromise  or nerve root encroachment.   IMPRESSION:   1. Stable small posterolateral disc protrusion on the right at L4-  L5. No resulting spinal stenosis or nerve root encroachment.  2. Mild facet disease inferiorly.  3. No acute disc space findings, significant spinal stenosis or  nerve root encroachment.  4. 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 Correlate clinically.   Original Report Authenticated By: Vivia Ewing, M.D.  ---   MRI THORACIC SPINE WITHOUT CONTRAST    TECHNIQUE:  Multiplanar, multisequence MR imaging of the thoracic spine was  performed. No intravenous contrast was administered.    COMPARISON: Thoracic spine MRI 05/04/2005.    FINDINGS:  Limited cervical spine imaging: Partially visible C6-C7 disc space  loss and disc herniation on series 4, image 6.    Thoracic spine segmentation: Appears normal. The same numbering  system is used today as on the 2007 MRI.    Alignment: Stable thoracic vertebral height and alignment since  2007. Mild straightening of lower thoracic kyphosis.    Vertebrae: Several benign chronic vertebral body hemangiomas in the  lower thoracic spine are stable since 2007, most notable at T11 and  T12. Underlying bone marrow signal remains within normal limits.  Mild chronic endplate irregularity inferiorly at T10, and at T11-T12  is stable since 2007. No marrow edema or evidence of acute osseous  abnormality.    Cord:  Spinal cord signal is within normal limits at all visualized  levels. Capacious thoracic spinal canal. Conus medullaris occurs  below the T12-L1 level and is not visible.    Paraspinal and other soft tissues: Negative visualized thoracic  inlet and thoracic viscera. Negative visualized posterior paraspinal  soft tissues.    Disc levels:    T1-T2: Negative.    T2-T3: Mild facet hypertrophy. No stenosis.    T3-T4: Borderline to mild facet hypertrophy. No stenosis.    T4-T5: Negative.    T5-T6: Negative.    T6-T7: Subtle central disc protrusion is new since 2007 but only  faintly visible (series 4, image 7). No stenosis.    T7-T8: Small lobulated left greater than right paracentral disc  protrusions are new or increased since 2007, best seen on series 8,  image 23. No associated stenosis.    T8-T9: Subtle if any left paracentral disc protrusion. No stenosis.    T9-T10: Mild facet hypertrophy. No stenosis.  T10-T11: Minimal disc bulge. Mild facet and ligament flavum  hypertrophy. No stenosis.    T11-T12: Minimal disc bulge. No stenosis.    T12-L1: Negative.    IMPRESSION:  1. Largely stable MRI appearance of the thoracic spine since 2007.  No acute or interval osseous abnormality.  2. Mild for age thoracic spine degeneration and capacious underlying  thoracic spinal canal. Subtle disc protrusions from T6-T7 to T8-T9  are new or have increased since 2007, but results in no spinal  stenosis or neural impingement.      Electronically Signed  By: Genevie Ann M.D.  On: 01/28/2017 15:25   She reports that she has never smoked. She has never used smokeless tobacco. No results for input(s): HGBA1C, LABURIC in the last 8760 hours.  Objective:  VS:  HT:    WT:   BMI:     BP:110/64  HR:60bpm  TEMP: ( )  RESP:  Physical Exam Constitutional:      General: She is not in acute distress.    Appearance: Normal appearance. She is not ill-appearing.      Comments: Very thin appearing  HENT:     Head: Normocephalic and atraumatic.     Right Ear: External ear normal.     Left Ear: External ear normal.  Eyes:     Extraocular Movements: Extraocular movements intact.  Cardiovascular:     Rate and Rhythm: Normal rate.     Pulses: Normal pulses.  Abdominal:     General: Abdomen is flat. There is no distension.     Palpations: Abdomen is soft.  Musculoskeletal:     Right lower leg: No edema.     Left lower leg: No edema.     Comments: Patient has good distal strength with no pain over the greater trochanters.  No clonus or focal weakness.  She continues to have some kyphosis of the thoracic spine.  She has a positive Fortin finger test over the right PSIS.  She has some pain over the right more than left PSIS.  She has good hip range of motion although a little bit decreased with internal rotation.  She has intact sensation.  Skin:    Findings: No erythema, lesion or rash.  Neurological:     General: No focal deficit present.     Mental Status: She is alert and oriented to person, place, and time.     Sensory: No sensory deficit.     Motor: No weakness or abnormal muscle tone.     Coordination: Coordination normal.  Psychiatric:        Mood and Affect: Mood normal.        Behavior: Behavior normal.     Ortho Exam  Imaging: No results found.  Past Medical/Family/Surgical/Social History: Medications & Allergies reviewed per EMR, new medications updated. Patient Active Problem List   Diagnosis Date Noted  . Thoracic disc disease 04/19/2019  . Myofascial pain syndrome 04/19/2019  . Idiopathic peripheral neuropathy 02/07/2019  . Acquired hypothyroidism 02/03/2018  . Hormone replacement therapy (HRT) 02/03/2018  . Underweight 02/03/2018  . GERD (gastroesophageal reflux disease)   . Postural kyphosis of thoracic region 03/16/2017  . Neck pain 11/20/2016  . Chronic right-sided thoracic back pain 08/03/2016  . Celiac disease  07/20/2011  . Dietary iron deficiency - malabsorption 07/20/2011  . Chronic idiopathic monocytosis 07/20/2011   Past Medical History:  Diagnosis Date  . Acquired hypothyroidism 02/03/2018  . Anxiety   . Arthritis   . Celiac disease 07/20/2011  .  Chronic idiopathic monocytosis 07/20/2011  . Dysphagia 12/2012   responsive to dexilant  . GERD (gastroesophageal reflux disease)   . H/O eating disorder   . Hypothyroidism   . Iron deficiency anemia 07/20/2011  . Iron deficiency anemia, unspecified 07/20/2011  . Migraine headache   . Other specified intestinal malabsorption 07/20/2011   Family History  Problem Relation Age of Onset  . Cancer Mother   . Heart disease Mother   . Heart disease Father   . Hyperlipidemia Father   . Hypertension Father   . Stroke Father   . Hypertension Brother   . Cancer Maternal Grandmother   . Heart disease Paternal Grandmother   . Stroke Maternal Grandfather    Past Surgical History:  Procedure Laterality Date  . ABDOMINAL HYSTERECTOMY    . COLONOSCOPY  06/2003   neg  . DIAGNOSTIC LAPAROSCOPY    . ESOPHAGOGASTRODUODENOSCOPY N/A 10/20/2012   Procedure: ESOPHAGOGASTRODUODENOSCOPY (EGD);  Surgeon: Cleotis Nipper, MD;  Location: [facility];  Service: Endoscopy;  Laterality: N/A;  . FLEXIBLE SIGMOIDOSCOPY  2005  . SINUS IRRIGATION    . TMJ ARTHROPLASTY     Social History   Occupational History  . 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 Smoking status: Never Smoker  . Smokeless tobacco: Never Used  Vaping Use  . Vaping Use: Never used  Substance and Sexual Activity  . Alcohol use: No    Alcohol/week: 0.0 standard drinks  . Drug use: No  . Sexual activity: Yes    Birth control/protection: Surgical

## 2019-11-18 ENCOUNTER — Other Ambulatory Visit: Payer: Self-pay

## 2019-11-18 ENCOUNTER — Ambulatory Visit
Admission: RE | Admit: 2019-11-18 | Discharge: 2019-11-18 | Disposition: A | Discharge status: Home / Self Care | Payer: 59 | Source: Ambulatory Visit | Attending: Physical Medicine and Rehabilitation | Admitting: Physical Medicine and Rehabilitation

## 2019-11-18 DIAGNOSIS — R102 Pelvic and perineal pain: Secondary | ICD-10-CM | POA: Diagnosis not present

## 2019-11-20 ENCOUNTER — Other Ambulatory Visit: Payer: Self-pay | Admitting: Family Medicine

## 2019-11-20 ENCOUNTER — Other Ambulatory Visit (HOSPITAL_BASED_OUTPATIENT_CLINIC_OR_DEPARTMENT_OTHER): Payer: Self-pay | Admitting: Family Medicine

## 2019-11-20 DIAGNOSIS — Z1231 Encounter for screening mammogram for malignant neoplasm of breast: Secondary | ICD-10-CM

## 2019-11-22 ENCOUNTER — Telehealth: Payer: Self-pay | Admitting: Physical Medicine and Rehabilitation

## 2019-11-22 ENCOUNTER — Other Ambulatory Visit: Payer: Self-pay

## 2019-11-22 ENCOUNTER — Ambulatory Visit (INDEPENDENT_AMBULATORY_CARE_PROVIDER_SITE_OTHER): Payer: 59

## 2019-11-22 DIAGNOSIS — Z1231 Encounter for screening mammogram for malignant neoplasm of breast: Secondary | ICD-10-CM

## 2019-11-22 NOTE — Telephone Encounter (Signed)
Pt called stating she would like to schedule her MRI Review appt and she states tomorrow she will be free for a call   [phone]   Sunday Corn- can you call this patient tomorrow and schedule for a 30 min appointment for OV with MRI review and possible ischial bursa injection?- No driver needed.

## 2019-11-22 NOTE — Telephone Encounter (Signed)
Left message #1 to schedule appointment for MRI review with possible ischial bursa injection.

## 2019-11-22 NOTE — Telephone Encounter (Signed)
Pt called stating she would like to schedule her MRI Review appt and she states tomorrow she will be free for a call  [phone]

## 2019-11-22 NOTE — Telephone Encounter (Signed)
-----   Message from Magnus Sinning, MD sent at 11/20/2019  8:36 AM EDT ----- Regarding: mri f/up possible ischial bursa inj

## 2019-11-23 NOTE — Telephone Encounter (Signed)
Called pt and lvm #1

## 2019-11-27 NOTE — Telephone Encounter (Signed)
Called pt and sch 11/2

## 2019-12-05 ENCOUNTER — Encounter: Payer: Self-pay | Admitting: Physical Medicine and Rehabilitation

## 2019-12-05 ENCOUNTER — Other Ambulatory Visit: Payer: Self-pay

## 2019-12-05 ENCOUNTER — Ambulatory Visit: Payer: 59 | Admitting: Physical Medicine and Rehabilitation

## 2019-12-05 VITALS — BP 97/62 | HR 74

## 2019-12-05 DIAGNOSIS — M7072 Other bursitis of hip, left hip: Secondary | ICD-10-CM

## 2019-12-05 DIAGNOSIS — M7918 Myalgia, other site: Secondary | ICD-10-CM | POA: Diagnosis not present

## 2019-12-05 DIAGNOSIS — M76899 Other specified enthesopathies of unspecified lower limb, excluding foot: Secondary | ICD-10-CM

## 2019-12-05 DIAGNOSIS — M5116 Intervertebral disc disorders with radiculopathy, lumbar region: Secondary | ICD-10-CM | POA: Diagnosis not present

## 2019-12-05 DIAGNOSIS — M7071 Other bursitis of hip, right hip: Secondary | ICD-10-CM | POA: Diagnosis not present

## 2019-12-05 DIAGNOSIS — M47816 Spondylosis without myelopathy or radiculopathy, lumbar region: Secondary | ICD-10-CM

## 2019-12-05 MED FILL — LEVOTHYROXINE SODIUM 125 MC: 125 | 84 days supply | Qty: 36 | Fill #1

## 2019-12-05 MED FILL — DEXILANT DR 60 MG CAPSULE: 60 | 90 days supply | Qty: 90 | Fill #1

## 2019-12-05 NOTE — Progress Notes (Signed)
  Numeric Pain Rating Scale and Functional Assessment Average Pain 6   In the last MONTH (on 0-10 scale) has pain interfered with the following?  1. General activity like being  able to carry out your everyday physical activities such as walking, climbing stairs, carrying groceries, or moving a chair?  Rating(4)

## 2019-12-08 ENCOUNTER — Ambulatory Visit (INDEPENDENT_AMBULATORY_CARE_PROVIDER_SITE_OTHER): Payer: 59 | Admitting: Physical Medicine and Rehabilitation

## 2019-12-08 ENCOUNTER — Other Ambulatory Visit: Payer: Self-pay

## 2019-12-08 DIAGNOSIS — M7918 Myalgia, other site: Secondary | ICD-10-CM

## 2019-12-08 DIAGNOSIS — M546 Pain in thoracic spine: Secondary | ICD-10-CM

## 2019-12-08 NOTE — Progress Notes (Signed)
Here for trigger point injections right lower back and left neck/ shoulder area. No changes since last visit.

## 2019-12-12 ENCOUNTER — Ambulatory Visit: Payer: 59 | Admitting: Physical Medicine and Rehabilitation

## 2019-12-15 DIAGNOSIS — L819 Disorder of pigmentation, unspecified: Secondary | ICD-10-CM | POA: Diagnosis not present

## 2019-12-15 DIAGNOSIS — D225 Melanocytic nevi of trunk: Secondary | ICD-10-CM | POA: Diagnosis not present

## 2019-12-15 DIAGNOSIS — L814 Other melanin hyperpigmentation: Secondary | ICD-10-CM | POA: Diagnosis not present

## 2019-12-15 DIAGNOSIS — Z85828 Personal history of other malignant neoplasm of skin: Secondary | ICD-10-CM | POA: Diagnosis not present

## 2019-12-15 DIAGNOSIS — D2372 Other benign neoplasm of skin of left lower limb, including hip: Secondary | ICD-10-CM | POA: Diagnosis not present

## 2019-12-15 DIAGNOSIS — L821 Other seborrheic keratosis: Secondary | ICD-10-CM | POA: Diagnosis not present

## 2019-12-19 DIAGNOSIS — E039 Hypothyroidism, unspecified: Secondary | ICD-10-CM | POA: Diagnosis not present

## 2019-12-20 MED FILL — clonazePAM 0.5 MG TABS: 0.5 | 30 days supply | Qty: 60 | Fill #0

## 2019-12-22 ENCOUNTER — Ambulatory Visit: Payer: 59 | Admitting: Physical Therapy

## 2019-12-22 ENCOUNTER — Other Ambulatory Visit (HOSPITAL_COMMUNITY): Payer: Self-pay | Admitting: Nurse Practitioner

## 2019-12-22 MED FILL — SUMAtriptan SUCCINATE 50 MG: 50 | 30 days supply | Qty: 10 | Fill #0

## 2020-01-09 MED FILL — LEVOTHYROXINE SODIUM 112 MC: 112 | 84 days supply | Qty: 48 | Fill #0

## 2020-01-15 MED FILL — SUMAtriptan SUCCINATE 50 MG: 50 | 30 days supply | Qty: 10 | Fill #1

## 2020-01-22 ENCOUNTER — Telehealth: Payer: 59 | Admitting: Family

## 2020-01-22 DIAGNOSIS — R399 Unspecified symptoms and signs involving the genitourinary system: Secondary | ICD-10-CM | POA: Diagnosis not present

## 2020-01-22 MED ORDER — CEPHALEXIN 500 MG PO CAPS: 500.0000 mg | ORAL_CAPSULE | Freq: Two times a day (BID) | ORAL | 0 refills | Status: DC

## 2020-01-22 NOTE — Progress Notes (Signed)

## 2020-01-24 DIAGNOSIS — R300 Dysuria: Secondary | ICD-10-CM | POA: Diagnosis not present

## 2020-01-24 DIAGNOSIS — M792 Neuralgia and neuritis, unspecified: Secondary | ICD-10-CM | POA: Diagnosis not present

## 2020-02-04 ENCOUNTER — Encounter: Payer: Self-pay | Admitting: Physical Medicine and Rehabilitation

## 2020-02-04 NOTE — Progress Notes (Signed)
Marilyn Collins - 65 y.o. female MRN [id number]  Date of birth: 23-Jul-1955  Office Visit Note: Visit Date: 12/05/2019 PCP: [facility], [place] Referred by: [facility]*  Subjective: Chief Complaint  Patient presents with  . Lower Back - Pain   HPI: Marilyn Collins is a 65 y.o. female who comes in today For evaluation management of multiple pain complaints including right thoracic paraspinal and shoulder blade pain as well as neck and shoulder pain and also low back and bilateral buttock and upper hamstring pain.  We had thought that she had likely ischial bursitis of the right hamstring given the fact that she had pain over this area and she is very thin individual.  We did obtain MRI of the pelvis and that is reviewed with her today using spine models and imaging.  The report is below.  She basically has small listhesis of L4 on L5 with right paracentral annular tear at L4-5 which could cause radicular buttock complaints.  She also does have a right enthesopathy or proximal hamstring tendinitis.  No compression fractures or other fractures.  Some cartilage loss of the left acetabulum.  She denies any groin pain or pain with hip movement.  In terms of her upper back thoracic MRI has been completed in the last few years without any real findings.  She clearly has myofascial pain syndrome and trigger points and those are active today.  She reports decreased symptoms since we saw her last but continued pain complaints in general.  No prior history of fibromyalgia.  She does stay active and exercises a lot.  She does work second shift and does have worsening with prolonged work schedule at times.  She rates her pain as a 6 out of 10 with no focal weakness no paresthesia.  Her symptoms today are the upper back trigger points.  Has not had recent physical therapy.  Review of Systems  Musculoskeletal: Positive for back pain, joint pain and neck pain.  All other systems reviewed and are  negative.  Otherwise per HPI.  Assessment & Plan: Visit Diagnoses:    ICD-10-CM   1. Ischial bursitis of right side  M70.71 Ambulatory referral to Physical Therapy  2. Ischial bursitis of left side  M70.72 Ambulatory referral to Physical Therapy  3. Hamstring tendonitis at origin  M76.899 Ambulatory referral to Physical Therapy  4. Radiculopathy due to lumbar intervertebral disc disorder  M51.16   5. Spondylosis without myelopathy or radiculopathy, lumbar region  M47.816   6. Myofascial pain syndrome  M79.18      Plan: Findings:  1.  Low back pain with bilateral upper thigh pain posteriorly likely a combination of disc disease with annular tear and bilateral hamstring tendinopathy and ischial tendinitis.  Been to make referral to physical therapy for a couple of sessions to show her exercises and treatment for the bursitis and to potentially look at dry needling.  In the future could look at epidural injection.  Could look at directed bursa injection.  2.  In terms of her upper back pain is clearly myofascial at this point.  We have completed facet joint blocks in the past with some mild spondylitic change but there is no real disc issues or stenosis or any worrisome factors in the thoracic spine.  I am to have her come back this week for focal trigger point injections.    Meds & Orders: No orders of the defined types were placed in this  encounter.   Orders Placed This Encounter  Procedures  . Ambulatory referral to Physical Therapy    Follow-up: Return for Trigger point injections in the upper back.   Procedures: No procedures performed      Clinical History: MRI PELVIS WITHOUT CONTRAST  FINDINGS: Bones:  No hip fracture, dislocation or avascular necrosis. No periosteal reaction or bone destruction. No aggressive osseous lesion.  Normal sacrum and sacroiliac joints. No SI joint widening or erosive changes.  Degenerative disease with disc desiccation at L4-5. 2  mm retrolisthesis of L4 on L5. Broad-based disc bulge at L4-5 with a right paracentral annular fissure.  Articular cartilage: Partial-thickness cartilage loss of the left superior acetabulum with subchondral marrow edema. Otherwise no focal chondral defect.  Labrum: Grossly intact, but evaluation is limited by lack of intraarticular fluid.  Joint or bursal effusion  Joint effusion:  No hip joint effusion.  No SI joint effusion.  Bursae:  No bursa formation.  Muscles and tendons  Flexors: Normal.  Extensors: Normal.  Abductors: Normal.  Adductors: Normal.  Gluteals: Normal.  Hamstrings: Mild tendinosis of the right hamstring origin with mild subcortical marrow edema in the ischial tuberosity as can be seen with enthesitis. Mild tendinosis of the left hamstring origin with mild subcortical marrow edema in the ischial tuberosity.   IMPRESSION: 1. Mild tendinosis of the right hamstring origin with mild subcortical marrow edema in the ischial tuberosity as can be seen with enthesitis. 2. Mild tendinosis of the left hamstring origin with mild subcortical marrow edema in the ischial tuberosity. 3. Partial-thickness cartilage loss of the left superior acetabulum with subchondral marrow edema. 4. Lumbar spine spondylosis at L4-5.   Electronically Signed   By: Kathreen Devoid   On: 11/20/2019 08:10 -----  MRI THORACIC SPINE WITHOUT CONTRAST      COMPARISON: Thoracic spine MRI 05/04/2005.    FINDINGS:  Limited cervical spine imaging: Partially visible C6-C7 disc space  loss and disc herniation on series 4, image 6.    Alignment: Stable thoracic vertebral height and alignment since  2007. Mild straightening of lower thoracic kyphosis.    Vertebrae: Several benign chronic vertebral body hemangiomas in the  lower thoracic spine are stable since 2007, most notable at T11 and  T12. Underlying bone marrow signal remains within normal limits.  Mild  chronic endplate irregularity inferiorly at T10, and at T11-T12  is stable since 2007. No marrow edema or evidence of acute osseous  abnormality.    Cord: Spinal cord signal is within normal limits at all visualized  levels. Capacious thoracic spinal canal. Conus medullaris occurs  below the T12-L1 level and is not visible.    Paraspinal and other soft tissues: Negative visualized thoracic  inlet and thoracic viscera. Negative visualized posterior paraspinal  soft tissues.    Disc levels:    T1-T2: Negative.    T2-T3: Mild facet hypertrophy. No stenosis.    T3-T4: Borderline to mild facet hypertrophy. No stenosis.    T4-T5: Negative.    T5-T6: Negative.    T6-T7: Subtle central disc protrusion is new since 2007 but only  faintly visible (series 4, image 7). No stenosis.    T7-T8: Small lobulated left greater than right paracentral disc  protrusions are new or increased since 2007, best seen on series 8,  image 23. No associated stenosis.    T8-T9: Subtle if any left paracentral disc protrusion. No stenosis.    T9-T10: Mild facet hypertrophy. No stenosis.    T10-T11: Minimal disc bulge.  Mild facet and ligament flavum  hypertrophy. No stenosis.    T11-T12: Minimal disc bulge. No stenosis.    T12-L1: Negative.    IMPRESSION:  1. Largely stable MRI appearance of the thoracic spine since 2007.  No acute or interval osseous abnormality.  2. Mild for age thoracic spine degeneration and capacious underlying  thoracic spinal canal. Subtle disc protrusions from T6-T7 to T8-T9  are new or have increased since 2007, but results in no spinal  stenosis or neural impingement.      Electronically Signed  By: Genevie Ann M.D.  On: 01/28/2017 15:25   She reports that she has never smoked. She has never used smokeless tobacco. No results for input(s): HGBA1C, LABURIC in the last 8760 hours.  Objective:  VS:  HT:    WT:   BMI:     BP:97/62  HR:74bpm   TEMP: ( )  RESP:  Physical Exam Constitutional:      General: She is not in acute distress.    Appearance: Normal appearance. She is not ill-appearing.     Comments: Very thin appearing  HENT:     Head: Normocephalic and atraumatic.     Right Ear: External ear normal.     Left Ear: External ear normal.  Eyes:     Extraocular Movements: Extraocular movements intact.  Cardiovascular:     Rate and Rhythm: Normal rate.     Pulses: Normal pulses.  Musculoskeletal:     Right lower leg: No edema.     Left lower leg: No edema.     Comments: Patient has good distal strength with no pain over the greater trochanters.  No clonus or focal weakness.  She does have focal trigger points in the thoracic paraspinal muscles and multifidus as well as rhomboid and levator scapula.  She has pain over the ischial tuberosities but not the PSIS.  No groin pain with rotation.  Skin:    Findings: No erythema, lesion or rash.  Neurological:     General: No focal deficit present.     Mental Status: She is alert and oriented to person, place, and time.     Sensory: No sensory deficit.     Motor: No weakness or abnormal muscle tone.     Coordination: Coordination normal.  Psychiatric:        Mood and Affect: Mood normal.        Behavior: Behavior normal.     Ortho Exam  Imaging: No results found.  Past Medical/Family/Surgical/Social History: Medications & Allergies reviewed per EMR, new medications updated. Patient Active Problem List   Diagnosis Date Noted  . Thoracic disc disease 04/19/2019  . Myofascial pain syndrome 04/19/2019  . Idiopathic peripheral neuropathy 02/07/2019  . Acquired hypothyroidism 02/03/2018  . Hormone replacement therapy (HRT) 02/03/2018  . Underweight 02/03/2018  . GERD (gastroesophageal reflux disease)   . Postural kyphosis of thoracic region 03/16/2017  . Neck pain 11/20/2016  . Chronic right-sided thoracic back pain 08/03/2016  . Celiac disease 07/20/2011  . Dietary  iron deficiency - malabsorption 07/20/2011  . Chronic idiopathic monocytosis 07/20/2011   Past Medical History:  Diagnosis Date  . Acquired hypothyroidism 02/03/2018  . Anxiety   . Arthritis   . Celiac disease 07/20/2011  . Chronic idiopathic monocytosis 07/20/2011  . Dysphagia 12/2012   responsive to dexilant  . GERD (gastroesophageal reflux disease)   . H/O eating disorder   . Hypothyroidism   . Iron deficiency anemia 07/20/2011  . Iron deficiency anemia,  unspecified 07/20/2011  . Migraine headache   . Other specified intestinal malabsorption 07/20/2011   Family History  Problem Relation Age of Onset  . Cancer Mother   . Heart disease Mother   . Heart disease Father   . Hyperlipidemia Father   . Hypertension Father   . Stroke Father   . Hypertension Brother   . Cancer Maternal Grandmother   . Heart disease Paternal Grandmother   . Stroke Maternal Grandfather    Past Surgical History:  Procedure Laterality Date  . ABDOMINAL HYSTERECTOMY    . COLONOSCOPY  06/2003   neg  . DIAGNOSTIC LAPAROSCOPY    . ESOPHAGOGASTRODUODENOSCOPY N/A 10/20/2012   Procedure: ESOPHAGOGASTRODUODENOSCOPY (EGD);  Surgeon: Cleotis Nipper, MD;  Location: [facility];  Service: Endoscopy;  Laterality: N/A;  . FLEXIBLE SIGMOIDOSCOPY  2005  . SINUS IRRIGATION    . TMJ ARTHROPLASTY     Social History   Occupational History  . 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 Smoking status: Never Smoker  . Smokeless tobacco: Never Used  Vaping Use  . Vaping Use: Never used  Substance and Sexual Activity  . Alcohol use: No    Alcohol/week: 0.0 standard drinks  . Drug use: No  . Sexual activity: Yes    Birth control/protection: Surgical

## 2020-02-04 NOTE — Progress Notes (Signed)
Marilyn Collins - 65 y.o. female MRN [id number]  Date of birth: Aug 28, 1955  Office Visit Note: Visit Date: 12/08/2019 PCP: [facility] [place] Referred by: [facility]  Subjective: Chief Complaint  Patient presents with  . Lower Back - Pain  . Neck - Pain   HPI:  Marilyn Collins is a 65 y.o. female who comes in today Patient here today for dry needling of the muscles in the thoracic paraspinal infraspinatus and levator scapula and rhomboid.  ROS Otherwise per HPI.  Assessment & Plan: Visit Diagnoses:    ICD-10-CM   1. Myofascial pain syndrome  M79.18   2. Pain in thoracic spine  M54.6     Plan: No additional findings.   Meds & Orders: No orders of the defined types were placed in this encounter.  No orders of the defined types were placed in this encounter.   Follow-up: Return if symptoms worsen or fail to improve.   Procedures: No procedures performed      Clinical History: MRI PELVIS WITHOUT CONTRAST  FINDINGS: Bones:  No hip fracture, dislocation or avascular necrosis. No periosteal reaction or bone destruction. No aggressive osseous lesion.  Normal sacrum and sacroiliac joints. No SI joint widening or erosive changes.  Degenerative disease with disc desiccation at L4-5. 2 mm retrolisthesis of L4 on L5. Broad-based disc bulge at L4-5 with a right paracentral annular fissure.  Articular cartilage: Partial-thickness cartilage loss of the left superior acetabulum with subchondral marrow edema. Otherwise no focal chondral defect.  Labrum: Grossly intact, but evaluation is limited by lack of intraarticular fluid.  Joint or bursal effusion  Joint effusion:  No hip joint effusion.  No SI joint effusion.  Bursae:  No bursa formation.  Muscles and tendons  Flexors: Normal.  Extensors: Normal.  Abductors: Normal.  Adductors: Normal.  Gluteals: Normal.  Hamstrings: Mild tendinosis of the right hamstring origin  with mild subcortical marrow edema in the ischial tuberosity as can be seen with enthesitis. Mild tendinosis of the left hamstring origin with mild subcortical marrow edema in the ischial tuberosity.   IMPRESSION: 1. Mild tendinosis of the right hamstring origin with mild subcortical marrow edema in the ischial tuberosity as can be seen with enthesitis. 2. Mild tendinosis of the left hamstring origin with mild subcortical marrow edema in the ischial tuberosity. 3. Partial-thickness cartilage loss of the left superior acetabulum with subchondral marrow edema. 4. Lumbar spine spondylosis at L4-5.   Electronically Signed   By: Kathreen Devoid   On: 11/20/2019 08:10 -----  MRI THORACIC SPINE WITHOUT CONTRAST      COMPARISON: Thoracic spine MRI 05/04/2005.    FINDINGS:  Limited cervical spine imaging: Partially visible C6-C7 disc space  loss and disc herniation on series 4, image 6.    Alignment: Stable thoracic vertebral height and alignment since  2007. Mild straightening of lower thoracic kyphosis.    Vertebrae: Several benign chronic vertebral body hemangiomas in the  lower thoracic spine are stable since 2007, most notable at T11 and  T12. Underlying bone marrow signal remains within normal limits.  Mild chronic endplate irregularity inferiorly at T10, and at T11-T12  is stable since 2007. No marrow edema or evidence of acute osseous  abnormality.    Cord: Spinal cord signal is within normal limits at all visualized  levels. Capacious thoracic spinal canal. Conus medullaris occurs  below the T12-L1 level and is not visible.    Paraspinal and other soft tissues:  Negative visualized thoracic  inlet and thoracic viscera. Negative visualized posterior paraspinal  soft tissues.    Disc levels:    T1-T2: Negative.    T2-T3: Mild facet hypertrophy. No stenosis.    T3-T4: Borderline to mild facet hypertrophy. No stenosis.    T4-T5: Negative.     T5-T6: Negative.    T6-T7: Subtle central disc protrusion is new since 2007 but only  faintly visible (series 4, image 7). No stenosis.    T7-T8: Small lobulated left greater than right paracentral disc  protrusions are new or increased since 2007, best seen on series 8,  image 23. No associated stenosis.    T8-T9: Subtle if any left paracentral disc protrusion. No stenosis.    T9-T10: Mild facet hypertrophy. No stenosis.    T10-T11: Minimal disc bulge. Mild facet and ligament flavum  hypertrophy. No stenosis.    T11-T12: Minimal disc bulge. No stenosis.    T12-L1: Negative.    IMPRESSION:  1. Largely stable MRI appearance of the thoracic spine since 2007.  No acute or interval osseous abnormality.  2. Mild for age thoracic spine degeneration and capacious underlying  thoracic spinal canal. Subtle disc protrusions from T6-T7 to T8-T9  are new or have increased since 2007, but results in no spinal  stenosis or neural impingement.      Electronically Signed  By: Genevie Ann M.D.  On: 01/28/2017 15:25     Objective:  VS:  HT:    WT:   BMI:     BP:   HR: bpm  TEMP: ( )  RESP:  Physical Exam Constitutional:      General: She is not in acute distress.    Appearance: Normal appearance. She is not ill-appearing.  HENT:     Head: Normocephalic and atraumatic.     Right Ear: External ear normal.     Left Ear: External ear normal.  Eyes:     Extraocular Movements: Extraocular movements intact.  Cardiovascular:     Rate and Rhythm: Normal rate.     Pulses: Normal pulses.  Musculoskeletal:     Right lower leg: No edema.     Left lower leg: No edema.     Comments: Patient has good distal strength with no pain over the greater trochanters.  No clonus or focal weakness.  Thoracic pain with palpation of active trigger points in the levator scapula rhomboid and infraspinatus.  Skin:    Findings: No erythema, lesion or rash.  Neurological:     General: No  focal deficit present.     Mental Status: She is alert and oriented to person, place, and time.     Sensory: No sensory deficit.     Motor: No weakness or abnormal muscle tone.     Coordination: Coordination normal.  Psychiatric:        Mood and Affect: Mood normal.        Behavior: Behavior normal.      Imaging: No results found.

## 2020-02-13 MED FILL — CEQUA 0.09 % SOLN: 0.09 | 90 days supply | Qty: 180 | Fill #2

## 2020-02-13 MED FILL — LEVOTHYROXINE SODIUM 125 MC: 125 | 84 days supply | Qty: 36 | Fill #2

## 2020-02-15 ENCOUNTER — Other Ambulatory Visit: Payer: Self-pay | Admitting: Physical Medicine and Rehabilitation

## 2020-02-15 DIAGNOSIS — M7918 Myalgia, other site: Secondary | ICD-10-CM

## 2020-02-15 MED FILL — BACLOFEN 10 MG TABS: 10 | 20 days supply | Qty: 60 | Fill #0

## 2020-02-15 NOTE — Telephone Encounter (Signed)
Please advise 

## 2020-02-27 ENCOUNTER — Other Ambulatory Visit (HOSPITAL_COMMUNITY): Payer: Self-pay | Admitting: *Deleted

## 2020-02-27 DIAGNOSIS — H16223 Keratoconjunctivitis sicca, not specified as Sjogren's, bilateral: Secondary | ICD-10-CM | POA: Diagnosis not present

## 2020-03-12 MED FILL — GABAPENTIN 100 MG CAPSULE: 100 | 10 days supply | Qty: 90 | Fill #3

## 2020-03-12 MED FILL — ESTRADIOL 0.1 MG PATCH: 0.1 | 84 days supply | Qty: 24 | Fill #1

## 2020-03-13 MED FILL — DEXLANSOPRAZOLE 60 MG CPDR: 60 | 90 days supply | Qty: 90 | Fill #2

## 2020-03-30 MED FILL — LEVOTHYROXINE SODIUM 112 MC: 112 | 84 days supply | Qty: 48 | Fill #1

## 2020-04-09 ENCOUNTER — Other Ambulatory Visit (HOSPITAL_BASED_OUTPATIENT_CLINIC_OR_DEPARTMENT_OTHER): Payer: Self-pay

## 2020-04-09 MED ORDER — CLONAZEPAM 0.5 MG PO TABS
0.5000 mg | ORAL_TABLET | Freq: Two times a day (BID) | ORAL | 4 refills | Status: DC
  Filled 2020-04-09: qty 60, 30d supply, fill #0
  Filled 2020-05-07 – 2020-06-10 (×2): qty 60, 30d supply, fill #1

## 2020-04-10 ENCOUNTER — Other Ambulatory Visit (HOSPITAL_BASED_OUTPATIENT_CLINIC_OR_DEPARTMENT_OTHER): Payer: Self-pay

## 2020-04-12 ENCOUNTER — Other Ambulatory Visit (HOSPITAL_BASED_OUTPATIENT_CLINIC_OR_DEPARTMENT_OTHER): Payer: Self-pay

## 2020-04-15 ENCOUNTER — Other Ambulatory Visit (HOSPITAL_BASED_OUTPATIENT_CLINIC_OR_DEPARTMENT_OTHER): Payer: Self-pay

## 2020-04-19 ENCOUNTER — Other Ambulatory Visit (HOSPITAL_BASED_OUTPATIENT_CLINIC_OR_DEPARTMENT_OTHER): Payer: Self-pay

## 2020-04-19 MED ORDER — AMOXICILLIN 500 MG PO CAPS
500.0000 mg | ORAL_CAPSULE | ORAL | 1 refills | Status: DC
  Filled 2020-04-19: qty 30, 10d supply, fill #0

## 2020-04-23 ENCOUNTER — Other Ambulatory Visit (HOSPITAL_BASED_OUTPATIENT_CLINIC_OR_DEPARTMENT_OTHER): Payer: Self-pay

## 2020-05-02 ENCOUNTER — Other Ambulatory Visit (HOSPITAL_BASED_OUTPATIENT_CLINIC_OR_DEPARTMENT_OTHER): Payer: Self-pay

## 2020-05-02 MED ORDER — FLUCONAZOLE 150 MG PO TABS
ORAL_TABLET | ORAL | 0 refills | Status: DC
  Filled 2020-05-02: qty 2, 6d supply, fill #0

## 2020-05-07 ENCOUNTER — Other Ambulatory Visit (HOSPITAL_BASED_OUTPATIENT_CLINIC_OR_DEPARTMENT_OTHER): Payer: Self-pay

## 2020-05-20 ENCOUNTER — Other Ambulatory Visit (HOSPITAL_BASED_OUTPATIENT_CLINIC_OR_DEPARTMENT_OTHER): Payer: Self-pay

## 2020-05-20 MED FILL — Levothyroxine Sodium Tab 125 MCG: ORAL | 90 days supply | Qty: 45 | Fill #0 | Status: AC

## 2020-06-03 ENCOUNTER — Other Ambulatory Visit (HOSPITAL_COMMUNITY): Payer: Self-pay | Admitting: Physician Assistant

## 2020-06-03 DIAGNOSIS — K5904 Chronic idiopathic constipation: Secondary | ICD-10-CM | POA: Diagnosis not present

## 2020-06-03 DIAGNOSIS — R1013 Epigastric pain: Secondary | ICD-10-CM | POA: Diagnosis not present

## 2020-06-03 DIAGNOSIS — K900 Celiac disease: Secondary | ICD-10-CM | POA: Diagnosis not present

## 2020-06-03 DIAGNOSIS — R432 Parageusia: Secondary | ICD-10-CM | POA: Diagnosis not present

## 2020-06-04 ENCOUNTER — Other Ambulatory Visit (HOSPITAL_BASED_OUTPATIENT_CLINIC_OR_DEPARTMENT_OTHER): Payer: Self-pay

## 2020-06-04 MED ORDER — SUCRALFATE 1 G PO TABS
ORAL_TABLET | ORAL | 0 refills | Status: DC
  Filled 2020-06-04: qty 60, 30d supply, fill #0

## 2020-06-05 ENCOUNTER — Other Ambulatory Visit (HOSPITAL_BASED_OUTPATIENT_CLINIC_OR_DEPARTMENT_OTHER): Payer: Self-pay

## 2020-06-05 MED ORDER — FLUCONAZOLE 150 MG PO TABS
ORAL_TABLET | ORAL | 0 refills | Status: DC
  Filled 2020-06-05: qty 2, 2d supply, fill #0

## 2020-06-05 MED ORDER — NYSTATIN 100000 UNIT/ML MT SUSP
OROMUCOSAL | 0 refills | Status: DC
  Filled 2020-06-05: qty 480, 30d supply, fill #0

## 2020-06-10 ENCOUNTER — Other Ambulatory Visit (HOSPITAL_BASED_OUTPATIENT_CLINIC_OR_DEPARTMENT_OTHER): Payer: Self-pay

## 2020-06-10 ENCOUNTER — Other Ambulatory Visit (HOSPITAL_COMMUNITY): Payer: Self-pay

## 2020-06-10 MED FILL — Dexlansoprazole Cap Delayed Release 60 MG: ORAL | 90 days supply | Qty: 90 | Fill #0 | Status: AC

## 2020-06-10 MED FILL — Sumatriptan Succinate Tab 50 MG: ORAL | 30 days supply | Qty: 10 | Fill #0 | Status: CN

## 2020-06-10 MED FILL — Sumatriptan Succinate Tab 50 MG: ORAL | 30 days supply | Qty: 10 | Fill #0 | Status: AC

## 2020-06-10 MED FILL — Dexlansoprazole Cap Delayed Release 60 MG: ORAL | 90 days supply | Qty: 90 | Fill #0 | Status: CN

## 2020-06-25 ENCOUNTER — Other Ambulatory Visit (HOSPITAL_BASED_OUTPATIENT_CLINIC_OR_DEPARTMENT_OTHER): Payer: Self-pay

## 2020-06-25 ENCOUNTER — Other Ambulatory Visit: Payer: Self-pay

## 2020-06-25 ENCOUNTER — Ambulatory Visit (HOSPITAL_COMMUNITY)
Admission: RE | Admit: 2020-06-25 | Discharge: 2020-06-25 | Disposition: A | Discharge status: Home / Self Care | Payer: 59 | Source: Ambulatory Visit | Attending: Physician Assistant | Admitting: Physician Assistant

## 2020-06-25 DIAGNOSIS — Z8719 Personal history of other diseases of the digestive system: Secondary | ICD-10-CM | POA: Diagnosis not present

## 2020-06-25 DIAGNOSIS — K5900 Constipation, unspecified: Secondary | ICD-10-CM | POA: Diagnosis not present

## 2020-06-25 DIAGNOSIS — R1013 Epigastric pain: Secondary | ICD-10-CM | POA: Diagnosis not present

## 2020-06-25 DIAGNOSIS — R109 Unspecified abdominal pain: Secondary | ICD-10-CM | POA: Diagnosis not present

## 2020-06-25 DIAGNOSIS — G8929 Other chronic pain: Secondary | ICD-10-CM | POA: Diagnosis not present

## 2020-06-25 MED ORDER — TECHNETIUM TC 99M MEBROFENIN IV KIT
5.5000 | PACK | Freq: Once | INTRAVENOUS | Status: AC
  Administered 2020-06-25: 5.5 via INTRAVENOUS

## 2020-06-25 MED FILL — Levothyroxine Sodium Tab 112 MCG: ORAL | 84 days supply | Qty: 48 | Fill #0 | Status: AC

## 2020-06-26 ENCOUNTER — Other Ambulatory Visit (HOSPITAL_BASED_OUTPATIENT_CLINIC_OR_DEPARTMENT_OTHER): Payer: Self-pay

## 2020-07-05 ENCOUNTER — Other Ambulatory Visit (HOSPITAL_BASED_OUTPATIENT_CLINIC_OR_DEPARTMENT_OTHER): Payer: Self-pay

## 2020-07-05 MED ORDER — ONDANSETRON HCL 8 MG PO TABS
ORAL_TABLET | ORAL | 2 refills | Status: DC
  Filled 2020-07-05: qty 30, 7d supply, fill #0

## 2020-07-11 ENCOUNTER — Other Ambulatory Visit (HOSPITAL_BASED_OUTPATIENT_CLINIC_OR_DEPARTMENT_OTHER): Payer: Self-pay

## 2020-07-11 DIAGNOSIS — K900 Celiac disease: Secondary | ICD-10-CM | POA: Diagnosis not present

## 2020-07-11 DIAGNOSIS — K5904 Chronic idiopathic constipation: Secondary | ICD-10-CM | POA: Diagnosis not present

## 2020-07-11 DIAGNOSIS — K140 Glossitis: Secondary | ICD-10-CM | POA: Diagnosis not present

## 2020-07-11 DIAGNOSIS — R1013 Epigastric pain: Secondary | ICD-10-CM | POA: Diagnosis not present

## 2020-07-11 DIAGNOSIS — R21 Rash and other nonspecific skin eruption: Secondary | ICD-10-CM | POA: Diagnosis not present

## 2020-07-11 MED ORDER — FLUCONAZOLE 100 MG PO TABS
ORAL_TABLET | ORAL | 0 refills | Status: DC
  Filled 2020-07-11: qty 10, 10d supply, fill #0

## 2020-07-17 ENCOUNTER — Telehealth: Payer: Self-pay | Admitting: Physical Medicine and Rehabilitation

## 2020-07-17 NOTE — Telephone Encounter (Signed)
Pt called asking for a CB to set an apt; she states she will be free the rest of the afternoon.   [phone]

## 2020-07-17 NOTE — Telephone Encounter (Signed)
Pt called and would like to schedule an appt with Dr.Newton for trigger point injections.  CB [phone]

## 2020-07-30 ENCOUNTER — Encounter: Payer: Self-pay | Admitting: Physical Medicine and Rehabilitation

## 2020-07-30 ENCOUNTER — Other Ambulatory Visit: Payer: Self-pay

## 2020-07-30 ENCOUNTER — Ambulatory Visit: Payer: 59 | Admitting: Physical Medicine and Rehabilitation

## 2020-07-30 DIAGNOSIS — M7918 Myalgia, other site: Secondary | ICD-10-CM | POA: Diagnosis not present

## 2020-07-30 DIAGNOSIS — M542 Cervicalgia: Secondary | ICD-10-CM | POA: Diagnosis not present

## 2020-07-30 DIAGNOSIS — M546 Pain in thoracic spine: Secondary | ICD-10-CM | POA: Diagnosis not present

## 2020-07-30 NOTE — Progress Notes (Signed)
Trigger points right side of low back and left upper back. Low back is worse.

## 2020-07-31 ENCOUNTER — Encounter: Payer: Self-pay | Admitting: Physical Medicine and Rehabilitation

## 2020-07-31 MED ORDER — LIDOCAINE HCL 1 % IJ SOLN
3.0000 mL | INTRAMUSCULAR | Status: AC | PRN
  Administered 2020-07-30: 3 mL

## 2020-07-31 MED ORDER — TRIAMCINOLONE ACETONIDE 40 MG/ML IJ SUSP
20.0000 mg | INTRAMUSCULAR | Status: AC | PRN
  Administered 2020-07-30: 20 mg via INTRAMUSCULAR

## 2020-07-31 NOTE — Progress Notes (Signed)
Marilyn Collins - 65 y.o. female MRN [id number]  Date of birth: Jun 17, 1955  Office Visit Note: Visit Date: 07/30/2020 PCP: [facility] [place] Referred by: [facility]  Subjective: Chief Complaint  Patient presents with   Lower Back - Pain   HPI: Marilyn Collins is a 65 y.o. female who comes in today For evaluation and management of chronic severe and recalcitrant upper back pain neck pain and lower back pain at times.  Her history can be reviewed with our prior notes.  She reports recently being on vacation and now having really worsening symptoms of the lower thoracic spine on the right which is a spot that she normally has that will flareup.  She also has pain in the left upper shoulder and trapezius.  Pain is quite severe at this time and does limit what she can do.  She is scheduled to go back to work in about a week.  She has a history of myofascial pain syndrome with good relief with trigger point injections at times.  We have completed facet joint blocks in the thoracic spine as well as she does have spondylosis.  Her case is complicated by being severely underweight with celiac disease with also history of anxiety and history of peripheral polyneuropathy.  Her symptoms are worse with certain positions of movement and standing.  She gets some relief with over-the-counter medications and heat and ice and rest and exercise.  She denies any red flag complaints or new trauma.  Review of Systems  Musculoskeletal:  Positive for back pain.  All other systems reviewed and are negative. Otherwise per HPI.  Assessment & Plan: Visit Diagnoses:    ICD-10-CM   1. Myofascial pain syndrome  M79.18     2. Pain in thoracic spine  M54.6     3. Cervicalgia  M54.2        Plan: Findings:  Chronic recalcitrant upper back mid back and low back pain at times with chronic pain syndrome and myofascial pain syndrome.  She does have spondylitic changes throughout the spine and she has  benefited in the past from facet joint type injections but is really done better with trigger point type therapy.  She continues to stay active and does exercise.  Her case is complicated by being very underweight.  We will complete trigger point injections today diagnostically and hopefully therapeutically.   Meds & Orders: No orders of the defined types were placed in this encounter.   Orders Placed This Encounter  Procedures   Trigger Point Inj    Follow-up: No follow-ups on file.   Procedures: Trigger Point Inj  Date/Time: 07/30/2020 8:22 AM Performed by: Magnus Sinning, MD Authorized by: Magnus Sinning, MD   Consent Given by:  Patient Site marked: the procedure site was marked   Timeout: prior to procedure the correct patient, procedure, and site was verified   Total # of Trigger Points:  3 or more Location: neck and back   Needle Size:  25 G Approach:  Dorsal Medications #1:  20 mg triamcinolone acetonide 40 MG/ML Medications #2:  3 mL lidocaine 1 % Additional Injections?: No   Patient tolerance:  Patient tolerated the procedure well with no immediate complications Comments: Trigger points palpated in the right lower paraspinal musculature and latissimus dorsi as well as the left upper trapezius and levator scapula.  Needling technique utilized      Clinical History: MRI PELVIS WITHOUT CONTRAST   FINDINGS: Bones:  No hip fracture, dislocation or avascular necrosis. No periosteal reaction or bone destruction. No aggressive osseous lesion.   Normal sacrum and sacroiliac joints. No SI joint widening or erosive changes.   Degenerative disease with disc desiccation at L4-5. 2 mm retrolisthesis of L4 on L5. Broad-based disc bulge at L4-5 with a right paracentral annular fissure.   Articular cartilage: Partial-thickness cartilage loss of the left superior acetabulum with subchondral marrow edema. Otherwise no focal chondral defect.   Labrum: Grossly intact, but  evaluation is limited by lack of intraarticular fluid.   Joint or bursal effusion   Joint effusion:  No hip joint effusion.  No SI joint effusion.   Bursae:  No bursa formation.   Muscles and tendons   Flexors: Normal.   Extensors: Normal.   Abductors: Normal.   Adductors: Normal.   Gluteals: Normal.   Hamstrings: Mild tendinosis of the right hamstring origin with mild subcortical marrow edema in the ischial tuberosity as can be seen with enthesitis. Mild tendinosis of the left hamstring origin with mild subcortical marrow edema in the ischial tuberosity.    IMPRESSION: 1. Mild tendinosis of the right hamstring origin with mild subcortical marrow edema in the ischial tuberosity as can be seen with enthesitis. 2. Mild tendinosis of the left hamstring origin with mild subcortical marrow edema in the ischial tuberosity. 3. Partial-thickness cartilage loss of the left superior acetabulum with subchondral marrow edema. 4. Lumbar spine spondylosis at L4-5.     Electronically Signed   By: Kathreen Devoid   On: 11/20/2019 08:10 -----  MRI THORACIC SPINE WITHOUT CONTRAST        COMPARISON:  Thoracic spine MRI 05/04/2005.     FINDINGS:  Limited cervical spine imaging: Partially visible C6-C7 disc space  loss and disc herniation on series 4, image 6.     Alignment: Stable thoracic vertebral height and alignment since  2007. Mild straightening of lower thoracic kyphosis.     Vertebrae: Several benign chronic vertebral body hemangiomas in the  lower thoracic spine are stable since 2007, most notable at T11 and  T12. Underlying bone marrow signal remains within normal limits.  Mild chronic endplate irregularity inferiorly at T10, and at T11-T12  is stable since 2007. No marrow edema or evidence of acute osseous  abnormality.     Cord: Spinal cord signal is within normal limits at all visualized  levels. Capacious thoracic spinal canal. Conus medullaris occurs  below the  T12-L1 level and is not visible.     Paraspinal and other soft tissues: Negative visualized thoracic  inlet and thoracic viscera. Negative visualized posterior paraspinal  soft tissues.     Disc levels:     T1-T2: Negative.     T2-T3: Mild facet hypertrophy.  No stenosis.     T3-T4: Borderline to mild facet hypertrophy.  No stenosis.     T4-T5: Negative.     T5-T6: Negative.     T6-T7: Subtle central disc protrusion is new since 2007 but only  faintly visible (series 4, image 7). No stenosis.     T7-T8: Small lobulated left greater than right paracentral disc  protrusions are new or increased since 2007, best seen on series 8,  image 23. No associated stenosis.     T8-T9: Subtle if any left paracentral disc protrusion.  No stenosis.     T9-T10: Mild facet hypertrophy.  No stenosis.     T10-T11: Minimal disc bulge. Mild facet and ligament flavum  hypertrophy. No stenosis.  T11-T12: Minimal disc bulge.  No stenosis.     T12-L1: Negative.     IMPRESSION:  1. Largely stable MRI appearance of the thoracic spine since 2007.  No acute or interval osseous abnormality.  2. Mild for age thoracic spine degeneration and capacious underlying  thoracic spinal canal. Subtle disc protrusions from T6-T7 to T8-T9  are new or have increased since 2007, but results in no spinal  stenosis or neural impingement.        Electronically Signed    By: Genevie Ann M.D.    On: 01/28/2017 15:25   She reports that she has never smoked. She has never used smokeless tobacco. No results for input(s): HGBA1C, LABURIC in the last 8760 hours.  Objective:  VS:  HT:    WT:   BMI:     BP:   HR: bpm  TEMP: ( )  RESP:  Physical Exam Vitals and nursing note reviewed.  Constitutional:      General: She is not in acute distress.    Appearance: Normal appearance. She is not ill-appearing.  HENT:     Head: Normocephalic and atraumatic.     Right Ear: External ear normal.     Left Ear: External ear  normal.  Eyes:     Extraocular Movements: Extraocular movements intact.  Cardiovascular:     Rate and Rhythm: Normal rate.     Pulses: Normal pulses.  Pulmonary:     Effort: Pulmonary effort is normal. No respiratory distress.  Abdominal:     General: There is no distension.     Palpations: Abdomen is soft.  Musculoskeletal:        General: Tenderness present.     Cervical back: Neck supple. Tenderness present.     Right lower leg: No edema.     Left lower leg: No edema.     Comments: Patient has good distal strength with no pain over the greater trochanters.  No clonus or focal weakness.  Skin:    Findings: No erythema, lesion or rash.  Neurological:     General: No focal deficit present.     Mental Status: She is alert and oriented to person, place, and time.     Sensory: No sensory deficit.     Motor: No weakness or abnormal muscle tone.     Coordination: Coordination normal.  Psychiatric:        Mood and Affect: Mood normal.        Behavior: Behavior normal.    Ortho Exam  Imaging: No results found.  Past Medical/Family/Surgical/Social History: Medications & Allergies reviewed per EMR, new medications updated. Patient Active Problem List   Diagnosis Date Noted   Thoracic disc disease 04/19/2019   Myofascial pain syndrome 04/19/2019   Idiopathic peripheral neuropathy 02/07/2019   Acquired hypothyroidism 02/03/2018   Hormone replacement therapy (HRT) 02/03/2018   Underweight 02/03/2018   GERD (gastroesophageal reflux disease)    Postural kyphosis of thoracic region 03/16/2017   Neck pain 11/20/2016   Chronic right-sided thoracic back pain 08/03/2016   Celiac disease 07/20/2011   Dietary iron deficiency - malabsorption 07/20/2011   Chronic idiopathic monocytosis 07/20/2011   Past Medical History:  Diagnosis Date   Acquired hypothyroidism 02/03/2018   Anxiety    Arthritis    Celiac disease 07/20/2011   Chronic idiopathic monocytosis 07/20/2011   Dysphagia  12/2012   responsive to dexilant   GERD (gastroesophageal reflux disease)    H/O eating disorder    Hypothyroidism  Iron deficiency anemia 07/20/2011   Iron deficiency anemia, unspecified 07/20/2011   Migraine headache    Other specified intestinal malabsorption 07/20/2011   Family History  Problem Relation Age of Onset   Cancer Mother    Heart disease Mother    Heart disease Father    Hyperlipidemia Father    Hypertension Father    Stroke Father    Hypertension Brother    Cancer Maternal Grandmother    Heart disease Paternal Grandmother    Stroke Maternal Grandfather    Past Surgical History:  Procedure Laterality Date   ABDOMINAL HYSTERECTOMY     COLONOSCOPY  06/2003   neg   DIAGNOSTIC LAPAROSCOPY     ESOPHAGOGASTRODUODENOSCOPY N/A 10/20/2012   Procedure: ESOPHAGOGASTRODUODENOSCOPY (EGD);  Surgeon: Cleotis Nipper, MD;  Location: [facility];  Service: Endoscopy;  Laterality: N/A;   FLEXIBLE SIGMOIDOSCOPY  2005   SINUS IRRIGATION     TMJ ARTHROPLASTY     Social History   Occupational History   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

## 2020-08-12 DIAGNOSIS — M2550 Pain in unspecified joint: Secondary | ICD-10-CM | POA: Diagnosis not present

## 2020-08-12 DIAGNOSIS — M791 Myalgia, unspecified site: Secondary | ICD-10-CM | POA: Diagnosis not present

## 2020-08-15 ENCOUNTER — Other Ambulatory Visit (HOSPITAL_COMMUNITY): Payer: Self-pay

## 2020-08-15 MED FILL — Estradiol TD Patch Twice Weekly 0.1 MG/24HR: TRANSDERMAL | 84 days supply | Qty: 24 | Fill #0 | Status: CN

## 2020-08-16 ENCOUNTER — Other Ambulatory Visit (HOSPITAL_COMMUNITY): Payer: Self-pay

## 2020-08-16 ENCOUNTER — Other Ambulatory Visit (HOSPITAL_BASED_OUTPATIENT_CLINIC_OR_DEPARTMENT_OTHER): Payer: Self-pay

## 2020-08-16 MED FILL — Cyclosporine (Ophth) Soln 0.09% (PF): OPHTHALMIC | 90 days supply | Qty: 180 | Fill #0 | Status: AC

## 2020-08-16 MED FILL — Estradiol TD Patch Twice Weekly 0.1 MG/24HR: TRANSDERMAL | 84 days supply | Qty: 24 | Fill #0 | Status: AC

## 2020-08-19 ENCOUNTER — Other Ambulatory Visit (HOSPITAL_BASED_OUTPATIENT_CLINIC_OR_DEPARTMENT_OTHER): Payer: Self-pay

## 2020-08-19 DIAGNOSIS — K3189 Other diseases of stomach and duodenum: Secondary | ICD-10-CM | POA: Diagnosis not present

## 2020-08-19 DIAGNOSIS — R1013 Epigastric pain: Secondary | ICD-10-CM | POA: Diagnosis not present

## 2020-08-19 DIAGNOSIS — K2930 Chronic superficial gastritis without bleeding: Secondary | ICD-10-CM | POA: Diagnosis not present

## 2020-08-19 DIAGNOSIS — K900 Celiac disease: Secondary | ICD-10-CM | POA: Diagnosis not present

## 2020-08-22 ENCOUNTER — Other Ambulatory Visit (HOSPITAL_BASED_OUTPATIENT_CLINIC_OR_DEPARTMENT_OTHER): Payer: Self-pay

## 2020-08-22 MED ORDER — SUCRALFATE 1 G PO TABS
ORAL_TABLET | ORAL | 11 refills | Status: DC
  Filled 2020-08-22 – 2020-08-26 (×2): qty 60, 30d supply, fill #0

## 2020-08-22 MED FILL — Sumatriptan Succinate Tab 50 MG: ORAL | 30 days supply | Qty: 10 | Fill #1 | Status: CN

## 2020-08-23 ENCOUNTER — Other Ambulatory Visit (HOSPITAL_BASED_OUTPATIENT_CLINIC_OR_DEPARTMENT_OTHER): Payer: Self-pay

## 2020-08-26 ENCOUNTER — Other Ambulatory Visit (HOSPITAL_BASED_OUTPATIENT_CLINIC_OR_DEPARTMENT_OTHER): Payer: Self-pay

## 2020-08-26 MED FILL — Sumatriptan Succinate Tab 50 MG: ORAL | 30 days supply | Qty: 10 | Fill #1 | Status: AC

## 2020-09-02 ENCOUNTER — Other Ambulatory Visit (HOSPITAL_BASED_OUTPATIENT_CLINIC_OR_DEPARTMENT_OTHER): Payer: Self-pay

## 2020-09-02 MED ORDER — DEXLANSOPRAZOLE 60 MG PO CPDR
DELAYED_RELEASE_CAPSULE | ORAL | 0 refills | Status: DC
  Filled 2020-09-02: qty 90, 90d supply, fill #0

## 2020-09-02 MED FILL — Levothyroxine Sodium Tab 125 MCG: ORAL | 63 days supply | Qty: 27 | Fill #1 | Status: AC

## 2020-09-02 MED FILL — Levothyroxine Sodium Tab 112 MCG: ORAL | 84 days supply | Qty: 48 | Fill #1 | Status: AC

## 2020-09-04 ENCOUNTER — Other Ambulatory Visit (HOSPITAL_BASED_OUTPATIENT_CLINIC_OR_DEPARTMENT_OTHER): Payer: Self-pay

## 2020-09-05 ENCOUNTER — Other Ambulatory Visit (HOSPITAL_BASED_OUTPATIENT_CLINIC_OR_DEPARTMENT_OTHER): Payer: Self-pay

## 2020-09-16 DIAGNOSIS — R29898 Other symptoms and signs involving the musculoskeletal system: Secondary | ICD-10-CM | POA: Diagnosis not present

## 2020-09-16 DIAGNOSIS — J343 Hypertrophy of nasal turbinates: Secondary | ICD-10-CM | POA: Diagnosis not present

## 2020-09-16 DIAGNOSIS — H9313 Tinnitus, bilateral: Secondary | ICD-10-CM | POA: Diagnosis not present

## 2020-09-16 DIAGNOSIS — H6121 Impacted cerumen, right ear: Secondary | ICD-10-CM | POA: Diagnosis not present

## 2020-10-08 ENCOUNTER — Other Ambulatory Visit (HOSPITAL_BASED_OUTPATIENT_CLINIC_OR_DEPARTMENT_OTHER): Payer: Self-pay

## 2020-10-08 MED FILL — Sumatriptan Succinate Tab 50 MG: ORAL | 30 days supply | Qty: 10 | Fill #2 | Status: AC

## 2020-10-11 DIAGNOSIS — R1013 Epigastric pain: Secondary | ICD-10-CM | POA: Diagnosis not present

## 2020-10-11 DIAGNOSIS — K3189 Other diseases of stomach and duodenum: Secondary | ICD-10-CM | POA: Diagnosis not present

## 2020-10-11 DIAGNOSIS — K900 Celiac disease: Secondary | ICD-10-CM | POA: Diagnosis not present

## 2020-10-11 DIAGNOSIS — K2930 Chronic superficial gastritis without bleeding: Secondary | ICD-10-CM | POA: Diagnosis not present

## 2020-10-15 ENCOUNTER — Other Ambulatory Visit: Payer: Self-pay | Admitting: Gastroenterology

## 2020-10-15 ENCOUNTER — Other Ambulatory Visit (HOSPITAL_BASED_OUTPATIENT_CLINIC_OR_DEPARTMENT_OTHER): Payer: Self-pay

## 2020-10-15 DIAGNOSIS — K900 Celiac disease: Secondary | ICD-10-CM

## 2020-10-15 DIAGNOSIS — R1013 Epigastric pain: Secondary | ICD-10-CM

## 2020-10-15 MED ORDER — FLUCONAZOLE 100 MG PO TABS
ORAL_TABLET | ORAL | 0 refills | Status: DC
  Filled 2020-10-15: qty 24, 11d supply, fill #0

## 2020-10-22 ENCOUNTER — Ambulatory Visit: Payer: 59 | Admitting: Rheumatology

## 2020-10-28 ENCOUNTER — Other Ambulatory Visit (HOSPITAL_BASED_OUTPATIENT_CLINIC_OR_DEPARTMENT_OTHER): Payer: Self-pay

## 2020-10-28 MED ORDER — LEVOTHYROXINE SODIUM 125 MCG PO TABS
ORAL_TABLET | ORAL | 0 refills | Status: DC
  Filled 2020-10-28: qty 45, 90d supply, fill #0

## 2020-11-06 NOTE — Progress Notes (Signed)
Office Visit Note  Patient: Marilyn Collins             Date of Birth: 09-18-1955           MRN: [id number]             PCP: [place], [place] Referring: Carolee Rota, NP Visit Date: 11/19/2020 Occupation: @GUAROCC @  Subjective:  Pain in multiple joints.   History of Present Illness: Marilyn Collins is a 65 y.o. female in consultation per request of her PCP.  According the patient she has had history of osteoarthritis for many years.  She recalls that she saw me about 15 years ago.  She states her arthritis is gradually getting worse.  She is having difficulty gripping objects.  She constantly have discomfort in her hands and her feet.  She states recently her muscles have been more painful as well.  She is very stiff after prolonged sitting.  She describes discomfort in her neck, upper back, lower back, right SI joint, bilateral hands and her feet.  She has had problems with left elbow medial epicondylitis in the past requiring injections.  She has been seeing Dr. Alfonse Spruce over the years for degenerative disc disease in her spine.  She is also had trigger point injections by Dr. Alfonse Spruce in her neck.  She gives history of celiac disease, gastroparesis and reflux.  She has been followed by gastroenterology.  Her abdominal symptoms still interfere with her routine.  Her parents had osteoarthritis.  Her paternal aunt had rheumatoid arthritis.  She is gravida 1, para 1.  Activities of Daily Living:  Patient reports morning stiffness for 30 minutes.   Patient Reports nocturnal pain.  Difficulty dressing/grooming: Denies Difficulty climbing stairs: Denies Difficulty getting out of chair: Reports Difficulty using hands for taps, buttons, cutlery, and/or writing: Reports  Review of Systems  Constitutional:  Positive for fatigue. Negative for night sweats, weight gain and weight loss.  HENT:  Positive for mouth dryness and sore tongue. Negative for mouth sores, trouble swallowing, trouble  swallowing and nose dryness.   Eyes:  Positive for itching and dryness. Negative for pain, redness and visual disturbance.  Respiratory:  Negative for cough, shortness of breath and difficulty breathing.   Cardiovascular:  Positive for palpitations. Negative for chest pain, hypertension, irregular heartbeat and swelling in legs/feet.  Gastrointestinal:  Positive for constipation. Negative for blood in stool and diarrhea.  Endocrine: Negative for increased urination.  Genitourinary:  Negative for difficulty urinating and vaginal dryness.  Musculoskeletal:  Positive for joint pain, joint pain, myalgias, morning stiffness, muscle tenderness and myalgias. Negative for joint swelling and muscle weakness.  Skin:  Negative for color change, rash, hair loss, redness, skin tightness, ulcers and sensitivity to sunlight.  Allergic/Immunologic: Negative for susceptible to infections.  Neurological:  Positive for numbness and headaches. Negative for dizziness, memory loss, night sweats and weakness.  Hematological:  Negative for bruising/bleeding tendency and swollen glands.  Psychiatric/Behavioral:  Negative for depressed mood, confusion and sleep disturbance. The patient is not nervous/anxious.    PMFS History:  Patient Active Problem List   Diagnosis Date Noted   DDD (degenerative disc disease), lumbar 11/19/2020   Thoracic disc disease 04/19/2019   Myofascial pain syndrome 04/19/2019   Idiopathic peripheral neuropathy 02/07/2019   Acquired hypothyroidism 02/03/2018   Hormone replacement therapy (HRT) 02/03/2018   Underweight 02/03/2018   GERD (gastroesophageal reflux disease)    Postural kyphosis of thoracic region 03/16/2017   Neck pain  11/20/2016   Chronic right-sided thoracic back pain 08/03/2016   Celiac disease 07/20/2011   Dietary iron deficiency - malabsorption 07/20/2011   Chronic idiopathic monocytosis 07/20/2011    Past Medical History:  Diagnosis Date   Acquired hypothyroidism  02/03/2018   Anxiety    Arthritis    Celiac disease 07/20/2011   Chronic idiopathic monocytosis 07/20/2011   Dysphagia 12/2012   responsive to dexilant   GERD (gastroesophageal reflux disease)    H/O eating disorder    Hypothyroidism    Iron deficiency anemia 07/20/2011   Iron deficiency anemia, unspecified 07/20/2011   Migraine headache    Other specified intestinal malabsorption 07/20/2011    Family History  Problem Relation Age of Onset   Cancer Mother    Heart disease Mother    Heart disease Father    Hyperlipidemia Father    Hypertension Father    Stroke Father    Hypertension Brother    Cancer Maternal Grandmother    Stroke Maternal Grandfather    Heart disease Paternal Grandmother    Anxiety disorder Son    Bipolar disorder Son    Past Surgical History:  Procedure Laterality Date   ABDOMINAL HYSTERECTOMY     COLONOSCOPY  06/2003   neg   DIAGNOSTIC LAPAROSCOPY     ESOPHAGOGASTRODUODENOSCOPY N/A 10/20/2012   Procedure: ESOPHAGOGASTRODUODENOSCOPY (EGD);  Surgeon: Cleotis Nipper, MD;  Location: [facility];  Service: Endoscopy;  Laterality: N/A;   FLEXIBLE SIGMOIDOSCOPY  2005   SINUS IRRIGATION     TMJ ARTHROPLASTY     Social History   Social History Narrative   RN at [employer]   Married   2 boys   Immunization History  Administered Date(s) Administered   Influenza-Unspecified 10/19/2016, 10/21/2018   Td 02/26/2009   Tdap 10/03/2016   Zoster Recombinat (Shingrix) 02/03/2018, 08/16/2018     Objective: Vital Signs: BP 106/65 (BP Location: Right Arm, Patient Position: Sitting, Cuff Size: Normal)   Pulse 63   Ht 5' 6"  (1.676 m)   Wt 98 lb 12.8 oz (44.8 kg)   BMI 15.95 kg/m    Physical Exam Vitals and nursing note reviewed.  Constitutional:      Appearance: She is well-developed.  HENT:     Head: Normocephalic and atraumatic.  Eyes:     Conjunctiva/sclera: Conjunctivae normal.  Cardiovascular:     Rate and Rhythm: Normal rate and regular rhythm.      Heart sounds: Normal heart sounds.  Pulmonary:     Effort: Pulmonary effort is normal.     Breath sounds: Normal breath sounds.  Abdominal:     General: Bowel sounds are normal.     Palpations: Abdomen is soft.  Musculoskeletal:     Cervical back: Normal range of motion.  Lymphadenopathy:     Cervical: No cervical adenopathy.  Skin:    General: Skin is warm and dry.     Capillary Refill: Capillary refill takes less than 2 seconds.  Neurological:     Mental Status: She is alert and oriented to person, place, and time.  Psychiatric:        Behavior: Behavior normal.     Musculoskeletal Exam: C-spine was in good range of motion.  She has some thoracic kyphosis without any point tenderness.  She had no tenderness over lumbar spine.  There is tenderness over right SI joint.  Shoulder joints, elbow joints, wrist joints were in good range of motion.  She had bilateral PIP and DIP thickening.  She  has some inflammation in her bilateral second MCP joint left third PIP joint and right fourth PIP joint.  Hip joints and knee joints with good range of motion.  There was no tenderness over ankles.  She had bilateral dorsal spurs and PIP and DIP thickening with no synovitis.  CDAI Exam: CDAI Score: -- Patient Global: --; Provider Global: -- Swollen: --; Tender: -- Joint Exam 11/19/2020   No joint exam has been documented for this visit   There is currently no information documented on the homunculus. Go to the Rheumatology activity and complete the homunculus joint exam.  Investigation: No additional findings.  Imaging: No results found.  Recent Labs: Lab Results  Component Value Date   WBC 7.6 02/07/2019   HGB 12.6 02/07/2019   PLT 246.0 02/07/2019   NA 138 02/03/2018   K 4.2 02/03/2018   CL 103 02/03/2018   CO2 29 02/03/2018   GLUCOSE 77 02/03/2018   BUN 18 02/03/2018   CREATININE 0.75 02/03/2018   BILITOT 0.5 02/03/2018   ALKPHOS 47 02/03/2018   AST 17 02/03/2018   ALT  16 02/03/2018   PROT 7.0 02/03/2018   ALBUMIN 4.3 02/03/2018   CALCIUM 9.7 02/03/2018    Speciality Comments: No specialty comments available.  Procedures:  No procedures performed Allergies: Morphine and related and Venofer [ferric oxide]   Assessment / Plan:     Visit Diagnoses: Sicca complex ([place]) - 06/03/20: Ro-, La-.  She complains of dry mouth and dry eyes.  There was no parotid swelling.  Her symptoms are most likely related to aging process and medication use.  Pain in both hands -she complains of pain and discomfort in her hands for many years.  Discomfort is getting worse over time and the grip strength is also getting worse.  She works out on a regular basis and [employer].  She had clinical findings consistent with osteoarthritis.  She also had some inflammatory component in her bilateral second MCP joints and PIP joints as described above.  Plan: XR Hand 2 View Right, XR Hand 2 View Left, x-rays of bilateral hands were consistent with severe osteoarthritis involving CMC PIP and DIP joints.  Sedimentation rate, Uric acid, Rheumatoid factor, Cyclic citrul peptide antibody, IgG  Pain in both feet -she complains of discomfort in her feet.  Bilateral dorsal spurs and PIP and DIP thickening was noted.  No synovitis was noted.  Plan: XR Foot 2 Views Right, XR Foot 2 Views Left.  X-rays of bilateral feet showed early osteoarthritic changes.  Myofascial pain syndrome-she continues to have some generalized pain and discomfort and stiffness.  She had bilateral trapezius spasm.  I will refer her to integrative therapies.  Neck pain-she complains of neck pain.  She had good range of motion of her cervical spine.  She had bilateral trapezius spasm.  She gets trigger point injections by Dr. Ernestina Patches.  Postural kyphosis of thoracic region  DDD (degenerative disc disease), thoracic-reviewed her previous x-rays.  Chronic right SI joint pain -she complains of right SI joint.  She had mild  tenderness on palpation.  Plan: XR Pelvis 1-2 Views.  X-ray showed early osteoarthritic changes.  No SI joint sclerosis or narrowing was noted.  DDD (degenerative disc disease), lumbar-she has lower back discomfort.  She has good mobility without radiculopathy.  Previous x-rays were reviewed which were consistent with disc disease and facet joint arthropathy.  Core strengthening exercises were emphasized.  Other medical problems are as follows:  Idiopathic peripheral neuropathy  Celiac disease-she is followed by gastroenterology.  History of gastroesophageal reflux (GERD)  Acquired hypothyroidism  Hormone replacement therapy (HRT)  Dietary iron deficiency - malabsorption  Chronic idiopathic monocytosis  Family history of rheumatoid arthritis - Paternal aunt  Orders: Orders Placed This Encounter  Procedures   XR Hand 2 View Right   XR Hand 2 View Left   XR Foot 2 Views Right   XR Foot 2 Views Left   XR Pelvis 1-2 Views   Sedimentation rate   Uric acid   Rheumatoid factor   Cyclic citrul peptide antibody, IgG    No orders of the defined types were placed in this encounter.    Follow-Up Instructions: Return for joint pain.   Bo Merino, MD  Note - This record has been created using Editor, commissioning.  Chart creation errors have been sought, but may not always  have been located. Such creation errors do not reflect on  the standard of medical care.

## 2020-11-07 DIAGNOSIS — E039 Hypothyroidism, unspecified: Secondary | ICD-10-CM | POA: Diagnosis not present

## 2020-11-07 DIAGNOSIS — Z0000 Encounter for general adult medical examination without abnormal findings: Secondary | ICD-10-CM | POA: Diagnosis not present

## 2020-11-07 DIAGNOSIS — Z13220 Encounter for screening for lipoid disorders: Secondary | ICD-10-CM | POA: Diagnosis not present

## 2020-11-07 DIAGNOSIS — Z131 Encounter for screening for diabetes mellitus: Secondary | ICD-10-CM | POA: Diagnosis not present

## 2020-11-07 DIAGNOSIS — K3184 Gastroparesis: Secondary | ICD-10-CM | POA: Diagnosis not present

## 2020-11-07 DIAGNOSIS — Z23 Encounter for immunization: Secondary | ICD-10-CM | POA: Diagnosis not present

## 2020-11-08 ENCOUNTER — Other Ambulatory Visit (HOSPITAL_BASED_OUTPATIENT_CLINIC_OR_DEPARTMENT_OTHER): Payer: Self-pay

## 2020-11-08 MED ORDER — LEVOTHYROXINE SODIUM 125 MCG PO TABS
ORAL_TABLET | ORAL | 3 refills | Status: DC
  Filled 2020-11-15 – 2021-01-08 (×3): qty 48, 84d supply, fill #0
  Filled 2021-04-07: qty 48, 84d supply, fill #1
  Filled 2021-06-17 – 2021-06-25 (×2): qty 48, 84d supply, fill #2
  Filled 2021-09-15: qty 48, 84d supply, fill #3

## 2020-11-08 MED ORDER — LEVOTHYROXINE SODIUM 112 MCG PO TABS
ORAL_TABLET | ORAL | 3 refills | Status: DC
  Filled 2020-11-08: qty 36, 30d supply, fill #0
  Filled 2020-12-24: qty 36, 84d supply, fill #0
  Filled 2021-03-27: qty 36, 84d supply, fill #1
  Filled 2021-06-17: qty 36, 84d supply, fill #2
  Filled 2021-09-15: qty 36, 84d supply, fill #3

## 2020-11-14 ENCOUNTER — Encounter: Payer: Self-pay | Admitting: Oncology

## 2020-11-14 ENCOUNTER — Other Ambulatory Visit (HOSPITAL_BASED_OUTPATIENT_CLINIC_OR_DEPARTMENT_OTHER): Payer: Self-pay

## 2020-11-14 MED ORDER — INFLUENZA VAC SPLIT QUAD 0.5 ML IM SUSY
PREFILLED_SYRINGE | INTRAMUSCULAR | 0 refills | Status: DC
  Filled 2020-11-14: qty 0.5, 1d supply, fill #0

## 2020-11-15 ENCOUNTER — Other Ambulatory Visit (HOSPITAL_BASED_OUTPATIENT_CLINIC_OR_DEPARTMENT_OTHER): Payer: Self-pay

## 2020-11-19 ENCOUNTER — Ambulatory Visit: Payer: Self-pay

## 2020-11-19 ENCOUNTER — Other Ambulatory Visit: Payer: Self-pay

## 2020-11-19 ENCOUNTER — Ambulatory Visit: Payer: 59 | Admitting: Rheumatology

## 2020-11-19 ENCOUNTER — Encounter: Payer: Self-pay | Admitting: Rheumatology

## 2020-11-19 VITALS — BP 106/65 | HR 63 | Ht 66.0 in | Wt 98.8 lb

## 2020-11-19 DIAGNOSIS — Z79890 Hormone replacement therapy (postmenopausal): Secondary | ICD-10-CM

## 2020-11-19 DIAGNOSIS — M2550 Pain in unspecified joint: Secondary | ICD-10-CM

## 2020-11-19 DIAGNOSIS — M79672 Pain in left foot: Secondary | ICD-10-CM | POA: Diagnosis not present

## 2020-11-19 DIAGNOSIS — M79642 Pain in left hand: Secondary | ICD-10-CM

## 2020-11-19 DIAGNOSIS — M3500 Sicca syndrome, unspecified: Secondary | ICD-10-CM | POA: Diagnosis not present

## 2020-11-19 DIAGNOSIS — M5136 Other intervertebral disc degeneration, lumbar region: Secondary | ICD-10-CM

## 2020-11-19 DIAGNOSIS — M5134 Other intervertebral disc degeneration, thoracic region: Secondary | ICD-10-CM | POA: Diagnosis not present

## 2020-11-19 DIAGNOSIS — M533 Sacrococcygeal disorders, not elsewhere classified: Secondary | ICD-10-CM

## 2020-11-19 DIAGNOSIS — M79641 Pain in right hand: Secondary | ICD-10-CM

## 2020-11-19 DIAGNOSIS — M79671 Pain in right foot: Secondary | ICD-10-CM | POA: Diagnosis not present

## 2020-11-19 DIAGNOSIS — G8929 Other chronic pain: Secondary | ICD-10-CM

## 2020-11-19 DIAGNOSIS — M542 Cervicalgia: Secondary | ICD-10-CM | POA: Diagnosis not present

## 2020-11-19 DIAGNOSIS — K900 Celiac disease: Secondary | ICD-10-CM

## 2020-11-19 DIAGNOSIS — M7918 Myalgia, other site: Secondary | ICD-10-CM | POA: Diagnosis not present

## 2020-11-19 DIAGNOSIS — Z8719 Personal history of other diseases of the digestive system: Secondary | ICD-10-CM

## 2020-11-19 DIAGNOSIS — M519 Unspecified thoracic, thoracolumbar and lumbosacral intervertebral disc disorder: Secondary | ICD-10-CM

## 2020-11-19 DIAGNOSIS — E039 Hypothyroidism, unspecified: Secondary | ICD-10-CM

## 2020-11-19 DIAGNOSIS — E611 Iron deficiency: Secondary | ICD-10-CM

## 2020-11-19 DIAGNOSIS — D72821 Monocytosis (symptomatic): Secondary | ICD-10-CM

## 2020-11-19 DIAGNOSIS — M4004 Postural kyphosis, thoracic region: Secondary | ICD-10-CM

## 2020-11-19 DIAGNOSIS — G609 Hereditary and idiopathic neuropathy, unspecified: Secondary | ICD-10-CM

## 2020-11-19 DIAGNOSIS — Z8261 Family history of arthritis: Secondary | ICD-10-CM

## 2020-11-19 NOTE — Patient Instructions (Signed)
Hand Exercises Hand exercises can be helpful for almost anyone. These exercises can strengthen the hands, improve flexibility and movement, and increase blood flow to the hands. These results can make work and daily tasks easier. Hand exercises can be especially helpful for people who have joint pain from arthritis or have nerve damage from overuse (carpal tunnel syndrome). These exercises can also help people who have injured a hand. Exercises Most of these hand exercises are gentle stretching and motion exercises. It is usually safe to do them often throughout the day. Warming up your hands before exercise may help to reduce stiffness. You can do this with gentle massage or by placing your hands in warm water for 10-15 minutes. It is normal to feel some stretching, pulling, tightness, or mild discomfort as you begin new exercises. This will gradually improve. Stop an exercise right away if you feel sudden, severe pain or your pain gets worse. Ask your health care provider which exercises are best for you. Knuckle bend or "claw" fist  Stand or sit with your arm, hand, and all five fingers pointed straight up. Make sure to keep your wrist straight during the exercise. Gently bend your fingers down toward your palm until the tips of your fingers are touching the top of your palm. Keep your big knuckle straight and just bend the small knuckles in your fingers. Hold this position for __________ seconds. Straighten (extend) your fingers back to the starting position. Repeat this exercise 5-10 times with each hand. Full finger fist  Stand or sit with your arm, hand, and all five fingers pointed straight up. Make sure to keep your wrist straight during the exercise. Gently bend your fingers into your palm until the tips of your fingers are touching the middle of your palm. Hold this position for __________ seconds. Extend your fingers back to the starting position, stretching every joint fully. Repeat  this exercise 5-10 times with each hand. Straight fist Stand or sit with your arm, hand, and all five fingers pointed straight up. Make sure to keep your wrist straight during the exercise. Gently bend your fingers at the big knuckle, where your fingers meet your hand, and the middle knuckle. Keep the knuckle at the tips of your fingers straight and try to touch the bottom of your palm. Hold this position for __________ seconds. Extend your fingers back to the starting position, stretching every joint fully. Repeat this exercise 5-10 times with each hand. Tabletop  Stand or sit with your arm, hand, and all five fingers pointed straight up. Make sure to keep your wrist straight during the exercise. Gently bend your fingers at the big knuckle, where your fingers meet your hand, as far down as you can while keeping the small knuckles in your fingers straight. Think of forming a tabletop with your fingers. Hold this position for __________ seconds. Extend your fingers back to the starting position, stretching every joint fully. Repeat this exercise 5-10 times with each hand. Finger spread  Place your hand flat on a table with your palm facing down. Make sure your wrist stays straight as you do this exercise. Spread your fingers and thumb apart from each other as far as you can until you feel a gentle stretch. Hold this position for __________ seconds. Bring your fingers and thumb tight together again. Hold this position for __________ seconds. Repeat this exercise 5-10 times with each hand. Making circles  Stand or sit with your arm, hand, and all five fingers pointed   straight up. Make sure to keep your wrist straight during the exercise. Make a circle by touching the tip of your thumb to the tip of your index finger. Hold for __________ seconds. Then open your hand wide. Repeat this motion with your thumb and each finger on your hand. Repeat this exercise 5-10 times with each hand. Thumb  motion  Sit with your forearm resting on a table and your wrist straight. Your thumb should be facing up toward the ceiling. Keep your fingers relaxed as you move your thumb. Lift your thumb up as high as you can toward the ceiling. Hold for __________ seconds. Bend your thumb across your palm as far as you can, reaching the tip of your thumb for the small finger (pinkie) side of your palm. Hold for __________ seconds. Repeat this exercise 5-10 times with each hand. Grip strengthening  Hold a stress ball or other soft ball in the middle of your hand. Slowly increase the pressure, squeezing the ball as much as you can without causing pain. Think of bringing the tips of your fingers into the middle of your palm. All of your finger joints should bend when doing this exercise. Hold your squeeze for __________ seconds, then relax. Repeat this exercise 5-10 times with each hand. Contact a health care provider if: Your hand pain or discomfort gets much worse when you do an exercise. Your hand pain or discomfort does not improve within 2 hours after you exercise. If you have any of these problems, stop doing these exercises right away. Do not do them again unless your health care provider says that you can. Get help right away if: You develop sudden, severe hand pain or swelling. If this happens, stop doing these exercises right away. Do not do them again unless your health care provider says that you can. This information is not intended to replace advice given to you by your health care provider. Make sure you discuss any questions you have with your health care provider. Document Revised: 05/09/2020 Document Reviewed: 05/09/2020 Else[employer] Patient Education  2022 [employer]  

## 2020-11-20 LAB — SEDIMENTATION RATE: Sed Rate: 2 mm/h (ref 0–30)

## 2020-11-20 LAB — RHEUMATOID FACTOR: Rheumatoid fact SerPl-aCnc: <14 IU/mL (ref <14)

## 2020-11-20 LAB — CYCLIC CITRUL PEPTIDE ANTIBODY, IGG: Cyclic Citrullin Peptide Ab: <16 UNITS

## 2020-11-20 LAB — URIC ACID: Uric Acid, Serum: 3.2 mg/dL (ref 2.5–7.0)

## 2020-11-20 NOTE — Progress Notes (Signed)
All the labs are within normal limits.  I will discuss results at the follow-up visit.

## 2020-11-22 DIAGNOSIS — M791 Myalgia, unspecified site: Secondary | ICD-10-CM | POA: Diagnosis not present

## 2020-11-26 DIAGNOSIS — M791 Myalgia, unspecified site: Secondary | ICD-10-CM | POA: Diagnosis not present

## 2020-12-05 DIAGNOSIS — M791 Myalgia, unspecified site: Secondary | ICD-10-CM | POA: Diagnosis not present

## 2020-12-05 NOTE — Progress Notes (Signed)
Office Visit Note  Patient: Marilyn Collins             Date of Birth: Oct 06, 1955           MRN: [id number]             PCP: [place], [place] Referring: [facility]* Visit Date: 12/19/2020 Occupation: @GUAROCC @  Subjective:  Pain in both hands.   History of Present Illness: Marilyn Collins is a 65 y.o. female with history of sicca symptoms, osteoarthritis, degenerative disc disease and myofascial pain syndrome.  She states she has been going to integrative therapies which has been very helpful.  She continues to have dry mouth and dry eyes.  She states she has burning in her tongue.  She has pain and discomfort in her bilateral hands especially over the [facility] joints.  She also has some discomfort in her knees and her feet.  She has discomfort in her entire spine.  Activities of Daily Living:  Patient reports morning stiffness for 2 minutes.   Patient Denies nocturnal pain.  Difficulty dressing/grooming: Denies Difficulty climbing stairs: Denies Difficulty getting out of chair: Denies Difficulty using hands for taps, buttons, cutlery, and/or writing: Denies  Review of Systems  Constitutional:  Positive for fatigue. Negative for night sweats, weight gain and weight loss.  HENT:  Positive for mouth dryness. Negative for mouth sores, trouble swallowing, trouble swallowing and nose dryness.   Eyes:  Positive for dryness. Negative for pain, redness and visual disturbance.  Respiratory:  Negative for cough, shortness of breath and difficulty breathing.   Cardiovascular:  Negative for chest pain, palpitations, hypertension, irregular heartbeat and swelling in legs/feet.  Gastrointestinal:  Positive for constipation. Negative for blood in stool and diarrhea.  Endocrine: Negative for increased urination.  Genitourinary:  Negative for vaginal dryness.  Musculoskeletal:  Positive for joint pain, joint pain, myalgias, morning stiffness and myalgias. Negative for joint  swelling, muscle weakness and muscle tenderness.  Skin:  Negative for color change, rash, hair loss, skin tightness, ulcers and sensitivity to sunlight.  Allergic/Immunologic: Negative for susceptible to infections.  Neurological:  Negative for dizziness, memory loss, night sweats and weakness.  Hematological:  Negative for swollen glands.  Psychiatric/Behavioral:  Negative for depressed mood and sleep disturbance. The patient is nervous/anxious.    PMFS History:  Patient Active Problem List   Diagnosis Date Noted   DDD (degenerative disc disease), lumbar 11/19/2020   Thoracic disc disease 04/19/2019   Myofascial pain syndrome 04/19/2019   Idiopathic peripheral neuropathy 02/07/2019   Acquired hypothyroidism 02/03/2018   Hormone replacement therapy (HRT) 02/03/2018   Underweight 02/03/2018   GERD (gastroesophageal reflux disease)    Postural kyphosis of thoracic region 03/16/2017   Neck pain 11/20/2016   Chronic right-sided thoracic back pain 08/03/2016   Celiac disease 07/20/2011   Dietary iron deficiency - malabsorption 07/20/2011   Chronic idiopathic monocytosis 07/20/2011    Past Medical History:  Diagnosis Date   Acquired hypothyroidism 02/03/2018   Anxiety    Arthritis    Celiac disease 07/20/2011   Chronic idiopathic monocytosis 07/20/2011   Dysphagia 12/2012   responsive to dexilant   GERD (gastroesophageal reflux disease)    H/O eating disorder    Hypothyroidism    Iron deficiency anemia 07/20/2011   Iron deficiency anemia, unspecified 07/20/2011   Migraine headache    Other specified intestinal malabsorption 07/20/2011    Family History  Problem Relation Age of Onset   Cancer  Mother    Heart disease Mother    Heart disease Father    Hyperlipidemia Father    Hypertension Father    Stroke Father    Hypertension Brother    Cancer Maternal Grandmother    Stroke Maternal Grandfather    Heart disease Paternal Grandmother    Anxiety disorder Son    Bipolar disorder  Son    Past Surgical History:  Procedure Laterality Date   ABDOMINAL HYSTERECTOMY     COLONOSCOPY  06/2003   neg   DIAGNOSTIC LAPAROSCOPY     ESOPHAGOGASTRODUODENOSCOPY N/A 10/20/2012   Procedure: ESOPHAGOGASTRODUODENOSCOPY (EGD);  Surgeon: Cleotis Nipper, MD;  Location: [facility];  Service: Endoscopy;  Laterality: N/A;   FLEXIBLE SIGMOIDOSCOPY  2005   SINUS IRRIGATION     TMJ ARTHROPLASTY     Social History   Social History Narrative   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  Constitutional:      Appearance: She is well-developed.  HENT:     Head: Normocephalic and atraumatic.  Eyes:     Conjunctiva/sclera: Conjunctivae normal.  Cardiovascular:     Rate and Rhythm: Normal rate and regular rhythm.     Heart sounds: Normal heart sounds.  Pulmonary:     Effort: Pulmonary effort is normal.     Breath sounds: Normal breath sounds.  Abdominal:     General: Bowel sounds are normal.     Palpations: Abdomen is soft.  Musculoskeletal:     Cervical back: Normal range of motion.  Lymphadenopathy:     Cervical: No cervical adenopathy.  Skin:    General: Skin is warm and dry.     Capillary Refill: Capillary refill takes less than 2 seconds.  Neurological:     Mental Status: She is alert and oriented to person, place, and time.  Psychiatric:        Behavior: Behavior normal.     Musculoskeletal Exam: C-spine, thoracic and lumbar spine with good range of motion.  Shoulder joints, elbow joints, wrist joints with good range of motion.   She had bilateral CMC prominence and tenderness.  She had bilateral PIP and DIP thickening.  No synovitis was noted.  Hip joints, knee joints with good range of motion.  There was no tenderness over ankles or MTPs.  CDAI Exam: CDAI Score: -- Patient Global: --; Provider Global: -- Swollen: --; Tender: -- Joint Exam 12/19/2020   No joint exam has been documented for this visit   There is currently no information documented on the homunculus. Go to the Rheumatology activity and complete the homunculus joint exam.  Investigation: No additional findings.  Imaging: No results found.  Recent Labs: Lab Results  Component Value Date   WBC 7.6 02/07/2019   HGB 12.6 02/07/2019   PLT 246.0 02/07/2019   NA 138 02/03/2018   K 4.2 02/03/2018   CL 103 02/03/2018   CO2 29 02/03/2018   GLUCOSE 77 02/03/2018   BUN 18 02/03/2018  CREATININE 0.75 02/03/2018   BILITOT 0.5 02/03/2018   ALKPHOS 47 02/03/2018   AST 17 02/03/2018   ALT 16 02/03/2018   PROT 7.0 02/03/2018   ALBUMIN 4.3 02/03/2018   CALCIUM 9.7 02/03/2018   November 19, 2020 ESR 2, uric acid 3.2, RF negative, anti-CCP negative   Speciality Comments: No specialty comments available.  Procedures:  No procedures performed Allergies: Morphine and related and Venofer [ferric oxide]   Assessment / Plan:     Visit Diagnoses: Sicca complex ([place]) - Negative SSA and SSB antibodies , RF negative.  Sicca  symptoms most likely related to the medication use. -Recent lab findings were discussed with the patient.  She continues to have sicca symptoms and burning in her tongue.  Over-the-counter products were discussed.  I also discussed possible use of pilocarpine.  Indications side effects contraindications were discussed.  Patient wants to proceed with pilocarpine.  Plan: pilocarpine (SALAGEN) 5 MG tablet 1 tablet p.o. 3 times daily as needed total 90 tablets with 2 refills were given.  Primary osteoarthritis of both hands - She had  inflammation in her bilateral second MCPs and PIPs.  She appears to have inflammatory osteoarthritis.  She also has severe bilateral CMC arthritis which causes discomfort.  She has CMC braces.  Radiographic findings are consistent with severe osteoarthritis.  X-rays obtained at the last visit showed bilateral CMC severe arthritis and PIP DIP narrowing.  X-ray findings were reviewed with the patient.  Chronic right SI joint pain - X-rays were consistent with mild osteoarthritis.  X-ray findings were reviewed with the patient today.  Primary osteoarthritis of both feet - Clinical and radiographic findings were consistent with osteoarthritis.  X-ray findings were reviewed with the patient.  Proper fitting shoes were discussed.  DDD (degenerative disc disease), cervical - She has mild degenerative changes and facet joint arthropathy.  Postural kyphosis of thoracic region  DDD (degenerative disc disease), thoracic-she has off-and-on crying discomfort.  DDD (degenerative disc disease), lumbar - She has degenerative changes and facet joint arthropathy.  She continues to have pain and discomfort.  Core strengthening exercises were discussed.  Myofascial pain syndrome - She had bilateral trapezius spasm.  She has had trigger point injections by Dr. Ernestina Patches in the past.  She has been going to integrative therapies which has been very helpful.  History of gastroesophageal reflux (GERD)-she has history of severe reflux.  She is on Dexilant and Carafate which is helping her symptoms.  Celiac disease-she is on gluten-free diet.  Idiopathic peripheral neuropathy-she uses gabapentin which has been helpful.  Acquired hypothyroidism  Dietary iron deficiency - malabsorption  Hormone replacement therapy (HRT)  Chronic idiopathic monocytosis  Family history of rheumatoid arthritis  Orders: No orders of the defined types were placed in this encounter.  Meds ordered this encounter  Medications    pilocarpine (SALAGEN) 5 MG tablet    Sig: Take 1 tablet (5 mg total) by mouth 3 (three) times daily as needed.    Dispense:  90 tablet    Refill:  2     Follow-Up Instructions: Return in about 3 months (around 03/21/2021) for Osteoarthritis.   Bo Merino, MD  Note - This record has been created using Editor, commissioning.  Chart creation errors have been sought, but may not always  have been located. Such creation errors do not reflect on  the standard of medical care.

## 2020-12-10 ENCOUNTER — Other Ambulatory Visit (HOSPITAL_BASED_OUTPATIENT_CLINIC_OR_DEPARTMENT_OTHER): Payer: Self-pay

## 2020-12-10 MED ORDER — DEXLANSOPRAZOLE 60 MG PO CPDR
DELAYED_RELEASE_CAPSULE | ORAL | 0 refills | Status: DC
  Filled 2020-12-10: qty 90, 90d supply, fill #0

## 2020-12-10 MED FILL — Sumatriptan Succinate Tab 50 MG: ORAL | 30 days supply | Qty: 9 | Fill #3 | Status: AC

## 2020-12-11 ENCOUNTER — Other Ambulatory Visit (HOSPITAL_BASED_OUTPATIENT_CLINIC_OR_DEPARTMENT_OTHER): Payer: Self-pay

## 2020-12-11 MED ORDER — GABAPENTIN 100 MG PO CAPS
ORAL_CAPSULE | ORAL | 5 refills | Status: DC
  Filled 2020-12-11: qty 30, 30d supply, fill #0

## 2020-12-13 DIAGNOSIS — M791 Myalgia, unspecified site: Secondary | ICD-10-CM | POA: Diagnosis not present

## 2020-12-19 ENCOUNTER — Other Ambulatory Visit (HOSPITAL_BASED_OUTPATIENT_CLINIC_OR_DEPARTMENT_OTHER): Payer: Self-pay

## 2020-12-19 ENCOUNTER — Encounter: Payer: Self-pay | Admitting: Rheumatology

## 2020-12-19 ENCOUNTER — Ambulatory Visit: Payer: 59 | Admitting: Rheumatology

## 2020-12-19 ENCOUNTER — Other Ambulatory Visit: Payer: Self-pay

## 2020-12-19 VITALS — BP 103/66 | HR 64 | Resp 12 | Ht 66.5 in | Wt 99.0 lb

## 2020-12-19 DIAGNOSIS — M533 Sacrococcygeal disorders, not elsewhere classified: Secondary | ICD-10-CM | POA: Diagnosis not present

## 2020-12-19 DIAGNOSIS — M19041 Primary osteoarthritis, right hand: Secondary | ICD-10-CM | POA: Diagnosis not present

## 2020-12-19 DIAGNOSIS — M3500 Sicca syndrome, unspecified: Secondary | ICD-10-CM

## 2020-12-19 DIAGNOSIS — M5134 Other intervertebral disc degeneration, thoracic region: Secondary | ICD-10-CM

## 2020-12-19 DIAGNOSIS — D72821 Monocytosis (symptomatic): Secondary | ICD-10-CM

## 2020-12-19 DIAGNOSIS — M19071 Primary osteoarthritis, right ankle and foot: Secondary | ICD-10-CM | POA: Diagnosis not present

## 2020-12-19 DIAGNOSIS — M7918 Myalgia, other site: Secondary | ICD-10-CM | POA: Diagnosis not present

## 2020-12-19 DIAGNOSIS — M5136 Other intervertebral disc degeneration, lumbar region: Secondary | ICD-10-CM

## 2020-12-19 DIAGNOSIS — Z8261 Family history of arthritis: Secondary | ICD-10-CM

## 2020-12-19 DIAGNOSIS — G8929 Other chronic pain: Secondary | ICD-10-CM

## 2020-12-19 DIAGNOSIS — M19042 Primary osteoarthritis, left hand: Secondary | ICD-10-CM

## 2020-12-19 DIAGNOSIS — E039 Hypothyroidism, unspecified: Secondary | ICD-10-CM

## 2020-12-19 DIAGNOSIS — M51369 Other intervertebral disc degeneration, lumbar region without mention of lumbar back pain or lower extremity pain: Secondary | ICD-10-CM

## 2020-12-19 DIAGNOSIS — M4004 Postural kyphosis, thoracic region: Secondary | ICD-10-CM | POA: Diagnosis not present

## 2020-12-19 DIAGNOSIS — E611 Iron deficiency: Secondary | ICD-10-CM

## 2020-12-19 DIAGNOSIS — M542 Cervicalgia: Secondary | ICD-10-CM

## 2020-12-19 DIAGNOSIS — M19072 Primary osteoarthritis, left ankle and foot: Secondary | ICD-10-CM

## 2020-12-19 DIAGNOSIS — M5030 Other cervical disc degeneration, unspecified cervical region: Secondary | ICD-10-CM | POA: Diagnosis not present

## 2020-12-19 DIAGNOSIS — Z8719 Personal history of other diseases of the digestive system: Secondary | ICD-10-CM

## 2020-12-19 DIAGNOSIS — G609 Hereditary and idiopathic neuropathy, unspecified: Secondary | ICD-10-CM

## 2020-12-19 DIAGNOSIS — K900 Celiac disease: Secondary | ICD-10-CM

## 2020-12-19 DIAGNOSIS — M791 Myalgia, unspecified site: Secondary | ICD-10-CM | POA: Diagnosis not present

## 2020-12-19 DIAGNOSIS — Z79890 Hormone replacement therapy (postmenopausal): Secondary | ICD-10-CM

## 2020-12-19 MED ORDER — PILOCARPINE HCL 5 MG PO TABS
5.0000 mg | ORAL_TABLET | Freq: Three times a day (TID) | ORAL | 2 refills | Status: DC | PRN
  Filled 2020-12-19: qty 90, 30d supply, fill #0

## 2020-12-20 ENCOUNTER — Other Ambulatory Visit (HOSPITAL_BASED_OUTPATIENT_CLINIC_OR_DEPARTMENT_OTHER): Payer: Self-pay

## 2020-12-23 ENCOUNTER — Other Ambulatory Visit: Payer: Self-pay | Admitting: Gastroenterology

## 2020-12-23 DIAGNOSIS — R1013 Epigastric pain: Secondary | ICD-10-CM

## 2020-12-23 DIAGNOSIS — E039 Hypothyroidism, unspecified: Secondary | ICD-10-CM | POA: Diagnosis not present

## 2020-12-23 DIAGNOSIS — K5900 Constipation, unspecified: Secondary | ICD-10-CM | POA: Diagnosis not present

## 2020-12-24 ENCOUNTER — Other Ambulatory Visit (HOSPITAL_BASED_OUTPATIENT_CLINIC_OR_DEPARTMENT_OTHER): Payer: Self-pay

## 2020-12-31 ENCOUNTER — Other Ambulatory Visit (HOSPITAL_BASED_OUTPATIENT_CLINIC_OR_DEPARTMENT_OTHER): Payer: Self-pay

## 2020-12-31 MED FILL — Cyclosporine (Ophth) Soln 0.09% (PF): OPHTHALMIC | 90 days supply | Qty: 180 | Fill #1 | Status: AC

## 2021-01-01 ENCOUNTER — Other Ambulatory Visit (HOSPITAL_BASED_OUTPATIENT_CLINIC_OR_DEPARTMENT_OTHER): Payer: Self-pay

## 2021-01-02 ENCOUNTER — Other Ambulatory Visit (HOSPITAL_BASED_OUTPATIENT_CLINIC_OR_DEPARTMENT_OTHER): Payer: Self-pay

## 2021-01-02 ENCOUNTER — Ambulatory Visit: Payer: 59 | Attending: Internal Medicine

## 2021-01-02 DIAGNOSIS — Z23 Encounter for immunization: Secondary | ICD-10-CM

## 2021-01-02 MED ORDER — ESTRADIOL 0.1 MG/24HR TD PTTW
MEDICATED_PATCH | TRANSDERMAL | 3 refills | Status: DC
  Filled 2021-01-02: qty 24, 84d supply, fill #0
  Filled 2021-08-02: qty 24, 84d supply, fill #1
  Filled 2021-12-10: qty 24, 84d supply, fill #2

## 2021-01-02 MED ORDER — PFIZER COVID-19 VAC BIVALENT 30 MCG/0.3ML IM SUSP
INTRAMUSCULAR | 0 refills | Status: DC
  Filled 2021-01-02: qty 0.3, 1d supply, fill #0

## 2021-01-02 NOTE — Progress Notes (Signed)
   Covid-19 Vaccination Clinic  Name:  Marilyn Collins    MRN: [id number] DOB: 06-07-1955  01/02/2021  [name] was observed post Covid-19 immunization for 15 minutes without incident. She was provided with Vaccine Information Sheet and instruction to access the V-Safe system.   [name] was instructed to call 911 with any severe reactions post vaccine: Difficulty breathing  Swelling of face and throat  A fast heartbeat  A bad rash all over body  Dizziness and weakness   Immunizations Administered     Name Date Dose VIS Date Route   Pfizer Covid-19 Vaccine Bivalent Booster 01/02/2021  7:45 AM 0.3 mL 10/02/2020 Intramuscular   Manufacturer: [place]   Lot: [id number]   [place]: [phone]

## 2021-01-03 ENCOUNTER — Other Ambulatory Visit (HOSPITAL_BASED_OUTPATIENT_CLINIC_OR_DEPARTMENT_OTHER): Payer: Self-pay

## 2021-01-03 DIAGNOSIS — L300 Nummular dermatitis: Secondary | ICD-10-CM | POA: Diagnosis not present

## 2021-01-03 DIAGNOSIS — D225 Melanocytic nevi of trunk: Secondary | ICD-10-CM | POA: Diagnosis not present

## 2021-01-03 DIAGNOSIS — L819 Disorder of pigmentation, unspecified: Secondary | ICD-10-CM | POA: Diagnosis not present

## 2021-01-03 DIAGNOSIS — L814 Other melanin hyperpigmentation: Secondary | ICD-10-CM | POA: Diagnosis not present

## 2021-01-03 MED ORDER — TRIAMCINOLONE ACETONIDE 0.1 % EX CREA
TOPICAL_CREAM | CUTANEOUS | 1 refills | Status: DC
Start: 2021-01-03 — End: 2021-11-24
  Filled 2021-01-03: qty 80, 30d supply, fill #0

## 2021-01-08 ENCOUNTER — Other Ambulatory Visit (HOSPITAL_BASED_OUTPATIENT_CLINIC_OR_DEPARTMENT_OTHER): Payer: Self-pay

## 2021-01-14 ENCOUNTER — Ambulatory Visit
Admission: RE | Admit: 2021-01-14 | Discharge: 2021-01-14 | Disposition: A | Discharge status: Home / Self Care | Payer: 59 | Source: Ambulatory Visit | Attending: Gastroenterology | Admitting: Gastroenterology

## 2021-01-14 DIAGNOSIS — R1013 Epigastric pain: Secondary | ICD-10-CM | POA: Diagnosis not present

## 2021-01-14 DIAGNOSIS — K900 Celiac disease: Secondary | ICD-10-CM | POA: Diagnosis not present

## 2021-01-14 MED ORDER — IOPAMIDOL (ISOVUE-300) INJECTION 61%
100.0000 mL | Freq: Once | INTRAVENOUS | Status: AC | PRN
  Administered 2021-01-14: 100 mL via INTRAVENOUS

## 2021-01-16 DIAGNOSIS — M791 Myalgia, unspecified site: Secondary | ICD-10-CM | POA: Diagnosis not present

## 2021-01-22 ENCOUNTER — Other Ambulatory Visit (HOSPITAL_BASED_OUTPATIENT_CLINIC_OR_DEPARTMENT_OTHER): Payer: Self-pay

## 2021-01-22 MED ORDER — GABAPENTIN 100 MG PO CAPS
ORAL_CAPSULE | ORAL | 2 refills | Status: DC
  Filled 2021-01-22: qty 90, 90d supply, fill #0
  Filled 2021-04-21: qty 90, 90d supply, fill #1
  Filled 2021-08-02: qty 90, 90d supply, fill #2

## 2021-01-28 ENCOUNTER — Other Ambulatory Visit (HOSPITAL_BASED_OUTPATIENT_CLINIC_OR_DEPARTMENT_OTHER): Payer: Self-pay

## 2021-01-28 DIAGNOSIS — M791 Myalgia, unspecified site: Secondary | ICD-10-CM | POA: Diagnosis not present

## 2021-01-28 MED ORDER — SUMATRIPTAN SUCCINATE 50 MG PO TABS
ORAL_TABLET | ORAL | 3 refills | Status: DC
  Filled 2021-01-28: qty 27, 90d supply, fill #0
  Filled 2021-04-21: qty 27, 90d supply, fill #1
  Filled 2021-11-14: qty 27, 90d supply, fill #2
  Filled 2022-01-01 – 2022-01-27 (×2): qty 27, 90d supply, fill #3

## 2021-02-06 DIAGNOSIS — M791 Myalgia, unspecified site: Secondary | ICD-10-CM | POA: Diagnosis not present

## 2021-02-11 DIAGNOSIS — M791 Myalgia, unspecified site: Secondary | ICD-10-CM | POA: Diagnosis not present

## 2021-02-27 DIAGNOSIS — M791 Myalgia, unspecified site: Secondary | ICD-10-CM | POA: Diagnosis not present

## 2021-03-12 ENCOUNTER — Other Ambulatory Visit (HOSPITAL_BASED_OUTPATIENT_CLINIC_OR_DEPARTMENT_OTHER): Payer: Self-pay

## 2021-03-12 MED ORDER — DEXLANSOPRAZOLE 60 MG PO CPDR
DELAYED_RELEASE_CAPSULE | ORAL | 2 refills | Status: DC
Start: 2021-03-12 — End: 2021-11-24
  Filled 2021-03-12 – 2021-03-13 (×3): qty 90, 90d supply, fill #0

## 2021-03-13 ENCOUNTER — Encounter: Payer: Self-pay | Admitting: Oncology

## 2021-03-13 ENCOUNTER — Other Ambulatory Visit (HOSPITAL_BASED_OUTPATIENT_CLINIC_OR_DEPARTMENT_OTHER): Payer: Self-pay

## 2021-03-14 ENCOUNTER — Encounter: Payer: Self-pay | Admitting: Oncology

## 2021-03-14 ENCOUNTER — Encounter (HOSPITAL_BASED_OUTPATIENT_CLINIC_OR_DEPARTMENT_OTHER): Payer: Self-pay

## 2021-03-14 ENCOUNTER — Other Ambulatory Visit (HOSPITAL_BASED_OUTPATIENT_CLINIC_OR_DEPARTMENT_OTHER): Payer: Self-pay

## 2021-03-17 ENCOUNTER — Other Ambulatory Visit (HOSPITAL_BASED_OUTPATIENT_CLINIC_OR_DEPARTMENT_OTHER): Payer: Self-pay

## 2021-03-18 NOTE — Progress Notes (Signed)
Office Visit Note  Patient: Marilyn Collins             Date of Birth: Jan 01, 1956           MRN: [id number]             PCP: [place], [place] Referring: [facility]* Visit Date: 04/01/2021 Occupation: @GUAROCC @  Subjective:  Dry eyes    History of Present Illness: Marilyn Collins is a 66 y.o. female with history of sicca complex, osteoarthritis, myofascial pain, and DDD.  The patient decided against starting on pilocarpine after her last office visit since her sicca symptoms have been tolerable overall and she is apprehensive of possible side effects.  She has been using cequa BID prescribed by her ophthalmologist.  She has been seeing her ophthalmologist on a yearly basis.  Her mouth dryness has been very minimal.  She denies any parotid swelling or tenderness.  She denies any cervical lymphadenopathy.  She has not had any salivary stones.  She continues to see the dentist every 6 months.  She denies any cavities recently.  She continues to have ongoing pain in both hands especially both CMC joints.  He denies any joint swelling at this time.  She has tried using [facility] joint braces in the past. Patient reports that she has noticed improvement in her neck pain and stiffness since starting physical therapy at integrative therapies.  She has been going to integrative therapies every 3 to 4 weeks and would like to continue going on a maintenance basis.  She has tried cupping, ultrasound, and massage.  Activities of Daily Living:  Patient reports morning stiffness for a few minutes.   Patient Denies nocturnal pain.  Difficulty dressing/grooming: Denies Difficulty climbing stairs: Denies Difficulty getting out of chair: Denies Difficulty using hands for taps, buttons, cutlery, and/or writing: Reports  Review of Systems  Constitutional:  Positive for fatigue.  HENT:  Positive for mouth dryness. Negative for mouth sores and nose dryness.   Eyes:  Positive for dryness.  Negative for pain and itching.  Respiratory:  Negative for shortness of breath and difficulty breathing.   Cardiovascular:  Positive for palpitations. Negative for chest pain.  Gastrointestinal:  Positive for constipation. Negative for blood in stool and diarrhea.  Endocrine: Positive for cold intolerance. Negative for increased urination.  Genitourinary:  Negative for difficulty urinating.  Musculoskeletal:  Positive for joint pain, joint pain, joint swelling, myalgias, morning stiffness, muscle tenderness and myalgias.  Skin:  Negative for color change, rash and redness.  Allergic/Immunologic: Negative for susceptible to infections.  Neurological:  Positive for numbness and headaches. Negative for dizziness and memory loss.  Hematological:  Negative for bruising/bleeding tendency.  Psychiatric/Behavioral:  Negative for confusion.    PMFS History:  Patient Active Problem List   Diagnosis Date Noted   DDD (degenerative disc disease), lumbar 11/19/2020   Thoracic disc disease 04/19/2019   Myofascial pain syndrome 04/19/2019   Idiopathic peripheral neuropathy 02/07/2019   Acquired hypothyroidism 02/03/2018   Hormone replacement therapy (HRT) 02/03/2018   Underweight 02/03/2018   GERD (gastroesophageal reflux disease)    Postural kyphosis of thoracic region 03/16/2017   Neck pain 11/20/2016   Chronic right-sided thoracic back pain 08/03/2016   Celiac disease 07/20/2011   Dietary iron deficiency - malabsorption 07/20/2011   Chronic idiopathic monocytosis 07/20/2011    Past Medical History:  Diagnosis Date   Acquired hypothyroidism 02/03/2018   Anxiety    Arthritis  Celiac disease 07/20/2011   Chronic idiopathic monocytosis 07/20/2011   Dysphagia 12/2012   responsive to dexilant   GERD (gastroesophageal reflux disease)    H/O eating disorder    Hypothyroidism    Iron deficiency anemia 07/20/2011   Iron deficiency anemia, unspecified 07/20/2011   Migraine headache    Other  specified intestinal malabsorption 07/20/2011    Family History  Problem Relation Age of Onset   Cancer Mother    Heart disease Mother    Heart disease Father    Hyperlipidemia Father    Hypertension Father    Stroke Father    Hypertension Brother    Cancer Maternal Grandmother    Stroke Maternal Grandfather    Heart disease Paternal Grandmother    Anxiety disorder Son    Bipolar disorder Son    Past Surgical History:  Procedure Laterality Date   ABDOMINAL HYSTERECTOMY     COLONOSCOPY  06/2003   neg   DIAGNOSTIC LAPAROSCOPY     ESOPHAGOGASTRODUODENOSCOPY N/A 10/20/2012   Procedure: ESOPHAGOGASTRODUODENOSCOPY (EGD);  Surgeon: Cleotis Nipper, MD;  Location: [facility];  Service: Endoscopy;  Laterality: N/A;   FLEXIBLE SIGMOIDOSCOPY  2005   SINUS IRRIGATION     TMJ ARTHROPLASTY     Social History   Social History 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  Constitutional:      Appearance: She is well-developed.  HENT:     Head: Normocephalic and atraumatic.  Eyes:     Conjunctiva/sclera: Conjunctivae normal.  Cardiovascular:     Rate and Rhythm: Normal rate and regular rhythm.     Heart sounds: Normal heart sounds.  Pulmonary:     Effort: Pulmonary effort is normal.     Breath sounds: Normal breath sounds.  Abdominal:     General: Bowel sounds are normal.     Palpations: Abdomen is soft.  Musculoskeletal:     Cervical back: Normal range of motion.   Skin:    General: Skin is warm and dry.     Capillary Refill: Capillary refill takes less than 2 seconds.  Neurological:     Mental Status: She is alert and oriented to person, place, and time.  Psychiatric:        Behavior: Behavior normal.     Musculoskeletal Exam: C-spine, thoracic spine, lumbar spine have good range of motion with no discomfort.  Shoulder joints, with joints, wrist joints, MCPs, PIPs, DIPs have good range of motion with no synovitis.  Synovial thickening of bilateral 2nd and 3rd MCP joints. Severe CMC joint thickening and prominence noted bilaterally.  PIP and DIP thickening consistent with osteoarthritis of both hands.  Hip joints have good range of motion with no groin pain.  No tenderness palpation over trochanteric bursa bilaterally.  Knee joints have good range of motion with no warmth or effusion.  Ankle joints have good range of motion with no  tenderness or joint swelling.  CDAI Exam: CDAI Score: -- Patient Global: --; Provider Global: -- Swollen: --; Tender: -- Joint Exam 04/01/2021   No joint exam has been documented for this visit   There is currently no information documented on the homunculus. Go to the Rheumatology activity and complete the homunculus joint exam.  Investigation: No additional findings.  Imaging: No results found.  Recent Labs: Lab Results  Component Value Date   WBC 7.6 02/07/2019   HGB 12.6 02/07/2019   PLT 246.0 02/07/2019   NA 138 02/03/2018   K 4.2 02/03/2018   CL 103 02/03/2018   CO2 29 02/03/2018   GLUCOSE 77 02/03/2018   BUN 18 02/03/2018   CREATININE 0.75 02/03/2018   BILITOT 0.5 02/03/2018   ALKPHOS 47 02/03/2018   AST 17 02/03/2018   ALT 16 02/03/2018   PROT 7.0 02/03/2018   ALBUMIN 4.3 02/03/2018   CALCIUM 9.7 02/03/2018    Speciality Comments: No specialty comments available.  Procedures:  No procedures performed Allergies: Morphine and related and Venofer [ferric oxide]   Assessment / Plan:      Visit Diagnoses: Sicca complex ([place]) - Negative SSA and SSB antibodies , RF negative.  Sicca symptoms most likely related to the medication use: She continues to have chronic eye dryness and uses cequa BID prescribed by her ophthalmologist. Discussed the use of over-the-counter products such as Systane or refresh for breakthrough symptoms.  She continues to follow-up with her ophthalmologist on a yearly basis.  Her mouth dryness has been tolerable.  She has not had any salivary stones, parotid swelling or tenderness, or cervical lymphadenopathy.  She continues to see the dentist every 6 months and has not had any recent dental caries.  She decided against starting on pilocarpine after her last office visit on 12/19/2020 since her symptoms were tolerable and she was apprehensive of possible side effects.  She plans on continuing on the current regimen and will notify us if she develops any new or worsening.  She will follow up in 6 months.    Primary osteoarthritis of both hands: RF and anti-CCP negative: She has PIP and DIP thickening consistent with osteoarthritis of both hands.  Synovial thickening of bilateral 2nd and 3rd MCP joints.  She has severe CMC joint arthritis bilaterally.  She has tried using [facility] joint braces in the past for support.  Discussed the use of Voltaren gel which she can apply topically as needed for pain relief.  I also discussed the importance of joint protection and muscle strengthening.  She was given a handout of hand exercises to perform as well as a jar gripper to assist her.  We discussed that if her [facility] joint pain persist or worsen we can schedule an ultrasound-guided [facility] joint injection in the future.  Hand therapy is also an option in the future if her symptoms persist or worsen  Chronic right SI joint pain - X-rays were consistent with mild osteoarthritis.  Primary osteoarthritis of both feet - Clinical and radiographic findings were consistent with osteoarthritis. She is  not experiencing any increased discomfort in her feet at this time.   DDD (degenerative disc disease), cervical - She has mild degenerative changes and facet joint arthropathy.  She has slightly limited ROM with lateral rotation.  No symptoms of radiculopathy.  Her discomfort and stiffness has improved since going to integrative therapies.   Postural kyphosis of thoracic region: Improved.    DDD (degenerative disc disease), thoracic: No midline spinal  tenderness.  Her trapezius muscle tension and tenderness have improved with PT.  She has tried ultrasound, cupping, and massage.  She plans on continuing to go to integrative therapies on a maintenance basis.   DDD (degenerative disc disease), lumbar: Improved. No midline spinal tenderness.   Myofascial pain syndrome - She has had trigger point injections by Dr. Ernestina Patches in the past. She has noticed improvement in her symptoms since starting PT at integr[facility] therapies.  She has tried ultrasound, cupping, and massage.    Other medical conditions are listed as follows:  History of gastroesophageal reflux (GERD)  Celiac disease  Idiopathic peripheral neuropathy  Dietary iron deficiency - malabsorption  Acquired hypothyroidism  Hormone replacement therapy (HRT)  Chronic idiopathic monocytosis  Family history of rheumatoid arthritis  Orders: No orders of the defined types were placed in this encounter.  No orders of the defined types were placed in this encounter.    Follow-Up Instructions: Return in about 6 months (around 09/29/2021) for Sicca complex, Osteoarthritis, myofascial pain.   Ofilia Neas, PA-C  Note - This record has been created using Dragon software.  Chart creation errors have been sought, but may not always  have been located. Such creation errors do not reflect on  the standard of medical care.

## 2021-03-20 ENCOUNTER — Other Ambulatory Visit (HOSPITAL_BASED_OUTPATIENT_CLINIC_OR_DEPARTMENT_OTHER): Payer: Self-pay

## 2021-03-20 DIAGNOSIS — M791 Myalgia, unspecified site: Secondary | ICD-10-CM | POA: Diagnosis not present

## 2021-03-20 MED ORDER — OMEPRAZOLE 40 MG PO CPDR
DELAYED_RELEASE_CAPSULE | ORAL | 1 refills | Status: DC
  Filled 2021-03-20: qty 90, 90d supply, fill #0
  Filled 2021-06-17: qty 90, 90d supply, fill #1

## 2021-03-27 ENCOUNTER — Other Ambulatory Visit (HOSPITAL_BASED_OUTPATIENT_CLINIC_OR_DEPARTMENT_OTHER): Payer: Self-pay

## 2021-03-31 ENCOUNTER — Ambulatory Visit: Payer: 59 | Admitting: Rheumatology

## 2021-04-01 ENCOUNTER — Encounter: Payer: Self-pay | Admitting: Physician Assistant

## 2021-04-01 ENCOUNTER — Other Ambulatory Visit (HOSPITAL_BASED_OUTPATIENT_CLINIC_OR_DEPARTMENT_OTHER): Payer: Self-pay

## 2021-04-01 ENCOUNTER — Ambulatory Visit: Payer: 59 | Admitting: Physician Assistant

## 2021-04-01 ENCOUNTER — Other Ambulatory Visit: Payer: Self-pay

## 2021-04-01 VITALS — BP 112/72 | HR 60 | Ht 66.5 in | Wt 97.2 lb

## 2021-04-01 DIAGNOSIS — M4004 Postural kyphosis, thoracic region: Secondary | ICD-10-CM

## 2021-04-01 DIAGNOSIS — E039 Hypothyroidism, unspecified: Secondary | ICD-10-CM

## 2021-04-01 DIAGNOSIS — M533 Sacrococcygeal disorders, not elsewhere classified: Secondary | ICD-10-CM

## 2021-04-01 DIAGNOSIS — M19071 Primary osteoarthritis, right ankle and foot: Secondary | ICD-10-CM

## 2021-04-01 DIAGNOSIS — M7918 Myalgia, other site: Secondary | ICD-10-CM

## 2021-04-01 DIAGNOSIS — M5136 Other intervertebral disc degeneration, lumbar region: Secondary | ICD-10-CM

## 2021-04-01 DIAGNOSIS — K900 Celiac disease: Secondary | ICD-10-CM

## 2021-04-01 DIAGNOSIS — E611 Iron deficiency: Secondary | ICD-10-CM

## 2021-04-01 DIAGNOSIS — M5030 Other cervical disc degeneration, unspecified cervical region: Secondary | ICD-10-CM

## 2021-04-01 DIAGNOSIS — M3500 Sicca syndrome, unspecified: Secondary | ICD-10-CM | POA: Diagnosis not present

## 2021-04-01 DIAGNOSIS — M19041 Primary osteoarthritis, right hand: Secondary | ICD-10-CM | POA: Diagnosis not present

## 2021-04-01 DIAGNOSIS — G609 Hereditary and idiopathic neuropathy, unspecified: Secondary | ICD-10-CM

## 2021-04-01 DIAGNOSIS — Z79890 Hormone replacement therapy (postmenopausal): Secondary | ICD-10-CM

## 2021-04-01 DIAGNOSIS — M19072 Primary osteoarthritis, left ankle and foot: Secondary | ICD-10-CM

## 2021-04-01 DIAGNOSIS — G8929 Other chronic pain: Secondary | ICD-10-CM

## 2021-04-01 DIAGNOSIS — M5134 Other intervertebral disc degeneration, thoracic region: Secondary | ICD-10-CM | POA: Diagnosis not present

## 2021-04-01 DIAGNOSIS — Z8719 Personal history of other diseases of the digestive system: Secondary | ICD-10-CM

## 2021-04-01 DIAGNOSIS — D72821 Monocytosis (symptomatic): Secondary | ICD-10-CM

## 2021-04-01 DIAGNOSIS — Z8261 Family history of arthritis: Secondary | ICD-10-CM

## 2021-04-01 DIAGNOSIS — M19042 Primary osteoarthritis, left hand: Secondary | ICD-10-CM

## 2021-04-01 MED ORDER — DICYCLOMINE HCL 20 MG PO TABS
ORAL_TABLET | ORAL | 0 refills | Status: DC
  Filled 2021-04-01 – 2021-04-11 (×2): qty 90, 30d supply, fill #0

## 2021-04-01 NOTE — Patient Instructions (Signed)
Hand Exercises Hand exercises can be helpful for almost anyone. These exercises can strengthen the hands, improve flexibility and movement, and increase blood flow to the hands. These results can make work and daily tasks easier. Hand exercises can be especially helpful for people who have joint pain from arthritis or have nerve damage from overuse (carpal tunnel syndrome). These exercises can also help people who have injured a hand. Exercises Most of these hand exercises are gentle stretching and motion exercises. It is usually safe to do them often throughout the day. Warming up your hands before exercise may help to reduce stiffness. You can do this with gentle massage or by placing your hands in warm water for 10-15 minutes. It is normal to feel some stretching, pulling, tightness, or mild discomfort as you begin new exercises. This will gradually improve. Stop an exercise right away if you feel sudden, severe pain or your pain gets worse. Ask your health care provider which exercises are best for you. Knuckle bend or "claw" fist  Stand or sit with your arm, hand, and all five fingers pointed straight up. Make sure to keep your wrist straight during the exercise. Gently bend your fingers down toward your palm until the tips of your fingers are touching the top of your palm. Keep your big knuckle straight and just bend the small knuckles in your fingers. Hold this position for __________ seconds. Straighten (extend) your fingers back to the starting position. Repeat this exercise 5-10 times with each hand. Full finger fist  Stand or sit with your arm, hand, and all five fingers pointed straight up. Make sure to keep your wrist straight during the exercise. Gently bend your fingers into your palm until the tips of your fingers are touching the middle of your palm. Hold this position for __________ seconds. Extend your fingers back to the starting position, stretching every joint fully. Repeat  this exercise 5-10 times with each hand. Straight fist Stand or sit with your arm, hand, and all five fingers pointed straight up. Make sure to keep your wrist straight during the exercise. Gently bend your fingers at the big knuckle, where your fingers meet your hand, and the middle knuckle. Keep the knuckle at the tips of your fingers straight and try to touch the bottom of your palm. Hold this position for __________ seconds. Extend your fingers back to the starting position, stretching every joint fully. Repeat this exercise 5-10 times with each hand. Tabletop  Stand or sit with your arm, hand, and all five fingers pointed straight up. Make sure to keep your wrist straight during the exercise. Gently bend your fingers at the big knuckle, where your fingers meet your hand, as far down as you can while keeping the small knuckles in your fingers straight. Think of forming a tabletop with your fingers. Hold this position for __________ seconds. Extend your fingers back to the starting position, stretching every joint fully. Repeat this exercise 5-10 times with each hand. Finger spread  Place your hand flat on a table with your palm facing down. Make sure your wrist stays straight as you do this exercise. Spread your fingers and thumb apart from each other as far as you can until you feel a gentle stretch. Hold this position for __________ seconds. Bring your fingers and thumb tight together again. Hold this position for __________ seconds. Repeat this exercise 5-10 times with each hand. Making circles  Stand or sit with your arm, hand, and all five fingers pointed   straight up. Make sure to keep your wrist straight during the exercise. Make a circle by touching the tip of your thumb to the tip of your index finger. Hold for __________ seconds. Then open your hand wide. Repeat this motion with your thumb and each finger on your hand. Repeat this exercise 5-10 times with each hand. Thumb  motion  Sit with your forearm resting on a table and your wrist straight. Your thumb should be facing up toward the ceiling. Keep your fingers relaxed as you move your thumb. Lift your thumb up as high as you can toward the ceiling. Hold for __________ seconds. Bend your thumb across your palm as far as you can, reaching the tip of your thumb for the small finger (pinkie) side of your palm. Hold for __________ seconds. Repeat this exercise 5-10 times with each hand. Grip strengthening  Hold a stress ball or other soft ball in the middle of your hand. Slowly increase the pressure, squeezing the ball as much as you can without causing pain. Think of bringing the tips of your fingers into the middle of your palm. All of your finger joints should bend when doing this exercise. Hold your squeeze for __________ seconds, then relax. Repeat this exercise 5-10 times with each hand. Contact a health care provider if: Your hand pain or discomfort gets much worse when you do an exercise. Your hand pain or discomfort does not improve within 2 hours after you exercise. If you have any of these problems, stop doing these exercises right away. Do not do them again unless your health care provider says that you can. Get help right away if: You develop sudden, severe hand pain or swelling. If this happens, stop doing these exercises right away. Do not do them again unless your health care provider says that you can. This information is not intended to replace advice given to you by your health care provider. Make sure you discuss any questions you have with your health care provider. Document Revised: 05/09/2020 Document Reviewed: 05/09/2020 Else[employer] Patient Education  2022 [employer]  

## 2021-04-02 DIAGNOSIS — E039 Hypothyroidism, unspecified: Secondary | ICD-10-CM | POA: Diagnosis not present

## 2021-04-03 DIAGNOSIS — M791 Myalgia, unspecified site: Secondary | ICD-10-CM | POA: Diagnosis not present

## 2021-04-07 ENCOUNTER — Other Ambulatory Visit (HOSPITAL_BASED_OUTPATIENT_CLINIC_OR_DEPARTMENT_OTHER): Payer: Self-pay

## 2021-04-08 ENCOUNTER — Other Ambulatory Visit (HOSPITAL_BASED_OUTPATIENT_CLINIC_OR_DEPARTMENT_OTHER): Payer: Self-pay

## 2021-04-10 ENCOUNTER — Other Ambulatory Visit (HOSPITAL_BASED_OUTPATIENT_CLINIC_OR_DEPARTMENT_OTHER): Payer: Self-pay

## 2021-04-11 ENCOUNTER — Other Ambulatory Visit (HOSPITAL_BASED_OUTPATIENT_CLINIC_OR_DEPARTMENT_OTHER): Payer: Self-pay

## 2021-04-14 ENCOUNTER — Other Ambulatory Visit (HOSPITAL_BASED_OUTPATIENT_CLINIC_OR_DEPARTMENT_OTHER): Payer: Self-pay

## 2021-04-14 DIAGNOSIS — H16223 Keratoconjunctivitis sicca, not specified as Sjogren's, bilateral: Secondary | ICD-10-CM | POA: Diagnosis not present

## 2021-04-14 DIAGNOSIS — H2513 Age-related nuclear cataract, bilateral: Secondary | ICD-10-CM | POA: Diagnosis not present

## 2021-04-14 MED ORDER — CEQUA 0.09 % OP SOLN
OPHTHALMIC | 3 refills | Status: DC
  Filled 2021-04-14: qty 180, 90d supply, fill #0
  Filled 2021-12-08: qty 180, 90d supply, fill #1

## 2021-04-22 ENCOUNTER — Other Ambulatory Visit (HOSPITAL_BASED_OUTPATIENT_CLINIC_OR_DEPARTMENT_OTHER): Payer: Self-pay

## 2021-04-22 DIAGNOSIS — M791 Myalgia, unspecified site: Secondary | ICD-10-CM | POA: Diagnosis not present

## 2021-04-28 ENCOUNTER — Other Ambulatory Visit (HOSPITAL_BASED_OUTPATIENT_CLINIC_OR_DEPARTMENT_OTHER): Payer: Self-pay

## 2021-05-07 DIAGNOSIS — M791 Myalgia, unspecified site: Secondary | ICD-10-CM | POA: Diagnosis not present

## 2021-05-22 DIAGNOSIS — Z0289 Encounter for other administrative examinations: Secondary | ICD-10-CM | POA: Diagnosis not present

## 2021-06-05 DIAGNOSIS — M791 Myalgia, unspecified site: Secondary | ICD-10-CM | POA: Diagnosis not present

## 2021-06-17 ENCOUNTER — Other Ambulatory Visit (HOSPITAL_BASED_OUTPATIENT_CLINIC_OR_DEPARTMENT_OTHER): Payer: Self-pay

## 2021-06-19 ENCOUNTER — Other Ambulatory Visit (HOSPITAL_BASED_OUTPATIENT_CLINIC_OR_DEPARTMENT_OTHER): Payer: Self-pay

## 2021-06-25 ENCOUNTER — Other Ambulatory Visit (HOSPITAL_BASED_OUTPATIENT_CLINIC_OR_DEPARTMENT_OTHER): Payer: Self-pay

## 2021-07-07 ENCOUNTER — Other Ambulatory Visit (HOSPITAL_COMMUNITY): Payer: Self-pay | Admitting: Gastroenterology

## 2021-07-07 ENCOUNTER — Other Ambulatory Visit: Payer: Self-pay | Admitting: Gastroenterology

## 2021-07-07 DIAGNOSIS — R140 Abdominal distension (gaseous): Secondary | ICD-10-CM

## 2021-07-07 DIAGNOSIS — R6881 Early satiety: Secondary | ICD-10-CM

## 2021-07-07 DIAGNOSIS — M25551 Pain in right hip: Secondary | ICD-10-CM | POA: Diagnosis not present

## 2021-07-07 DIAGNOSIS — R1013 Epigastric pain: Secondary | ICD-10-CM | POA: Diagnosis not present

## 2021-07-07 DIAGNOSIS — M542 Cervicalgia: Secondary | ICD-10-CM | POA: Diagnosis not present

## 2021-07-07 DIAGNOSIS — R293 Abnormal posture: Secondary | ICD-10-CM | POA: Diagnosis not present

## 2021-07-07 DIAGNOSIS — K5900 Constipation, unspecified: Secondary | ICD-10-CM | POA: Diagnosis not present

## 2021-07-28 ENCOUNTER — Ambulatory Visit (HOSPITAL_COMMUNITY): Payer: 59

## 2021-08-04 ENCOUNTER — Other Ambulatory Visit (HOSPITAL_BASED_OUTPATIENT_CLINIC_OR_DEPARTMENT_OTHER): Payer: Self-pay

## 2021-08-04 ENCOUNTER — Ambulatory Visit (HOSPITAL_COMMUNITY)
Admission: RE | Admit: 2021-08-04 | Discharge: 2021-08-04 | Disposition: A | Discharge status: Home / Self Care | Payer: 59 | Source: Ambulatory Visit | Attending: Gastroenterology | Admitting: Gastroenterology

## 2021-08-04 DIAGNOSIS — R140 Abdominal distension (gaseous): Secondary | ICD-10-CM | POA: Insufficient documentation

## 2021-08-04 DIAGNOSIS — R1010 Upper abdominal pain, unspecified: Secondary | ICD-10-CM | POA: Diagnosis not present

## 2021-08-04 DIAGNOSIS — R6881 Early satiety: Secondary | ICD-10-CM | POA: Insufficient documentation

## 2021-08-04 DIAGNOSIS — R112 Nausea with vomiting, unspecified: Secondary | ICD-10-CM | POA: Diagnosis not present

## 2021-08-04 MED ORDER — TECHNETIUM TC 99M SULFUR COLLOID
2.1000 | Freq: Once | INTRAVENOUS | Status: AC | PRN
  Administered 2021-08-04: 2.1 via ORAL

## 2021-08-07 ENCOUNTER — Other Ambulatory Visit (HOSPITAL_BASED_OUTPATIENT_CLINIC_OR_DEPARTMENT_OTHER): Payer: Self-pay

## 2021-08-07 MED ORDER — BACLOFEN 10 MG PO TABS
ORAL_TABLET | ORAL | 0 refills | Status: DC
  Filled 2021-08-07: qty 30, 30d supply, fill #0

## 2021-08-14 DIAGNOSIS — M542 Cervicalgia: Secondary | ICD-10-CM | POA: Diagnosis not present

## 2021-08-14 DIAGNOSIS — R293 Abnormal posture: Secondary | ICD-10-CM | POA: Diagnosis not present

## 2021-08-14 DIAGNOSIS — M25551 Pain in right hip: Secondary | ICD-10-CM | POA: Diagnosis not present

## 2021-08-25 ENCOUNTER — Other Ambulatory Visit (HOSPITAL_BASED_OUTPATIENT_CLINIC_OR_DEPARTMENT_OTHER): Payer: Self-pay

## 2021-08-26 DIAGNOSIS — M25551 Pain in right hip: Secondary | ICD-10-CM | POA: Diagnosis not present

## 2021-08-26 DIAGNOSIS — R293 Abnormal posture: Secondary | ICD-10-CM | POA: Diagnosis not present

## 2021-08-26 DIAGNOSIS — M542 Cervicalgia: Secondary | ICD-10-CM | POA: Diagnosis not present

## 2021-09-03 ENCOUNTER — Telehealth: Payer: Self-pay | Admitting: Physical Medicine and Rehabilitation

## 2021-09-03 NOTE — Telephone Encounter (Signed)
Pt called requesting a call back. Pt need an appt set. Please call pt at [phone].

## 2021-09-10 ENCOUNTER — Encounter: Payer: Self-pay | Admitting: Physical Medicine and Rehabilitation

## 2021-09-10 ENCOUNTER — Ambulatory Visit: Payer: 59 | Admitting: Physical Medicine and Rehabilitation

## 2021-09-10 DIAGNOSIS — M542 Cervicalgia: Secondary | ICD-10-CM

## 2021-09-10 DIAGNOSIS — G8929 Other chronic pain: Secondary | ICD-10-CM | POA: Diagnosis not present

## 2021-09-10 DIAGNOSIS — M546 Pain in thoracic spine: Secondary | ICD-10-CM

## 2021-09-10 DIAGNOSIS — M7918 Myalgia, other site: Secondary | ICD-10-CM | POA: Diagnosis not present

## 2021-09-10 NOTE — Progress Notes (Signed)
Here for trigger point injections left neck/ shoulder area.  Numeric Pain Rating Scale and Functional Assessment Average Pain 5   In the last MONTH (on 0-10 scale) has pain interfered with the following?  1. General activity like being  able to carry out your everyday physical activities such as walking, climbing stairs, carrying groceries, or moving a chair?  Rating(10)    -BT

## 2021-09-10 NOTE — Progress Notes (Signed)
   Marilyn Collins - 66 y.o. female MRN [id number]  Date of birth: December 28, 1955  Office Visit Note: Visit Date: 09/10/2021 PCP: [facility] [place] Referred by: [facility]  Subjective: Chief Complaint  Patient presents with   Neck - Pain   Left Shoulder - Pain   HPI:  Marilyn Collins is a 65 y.o. female who comes in today  for planned repeat trigger point injections of the upper thoracic spine.  She has had a long history of upper cervical upper back and mid thoracic back pain with myofascial pain syndrome and trigger points as well as some spondylitic change.  She has undergone osteopathic manipulation as well as epidural injection and facet blocks.  She is done better recently just with trigger point injections.  She has had physical therapy with dry needling.  Most of her symptoms today are in the upper right area with trigger points in the supraspinatus rhomboid and levator scapula.  Trigger points were injected and treated with needling technique.  Referring: Dr. Teresa Coombs   ROS Otherwise per HPI.  Assessment & Plan: Visit Diagnoses: No diagnosis found.  Plan: No additional findings.   Meds & Orders: No orders of the defined types were placed in this encounter.   Orders Placed This Encounter  Procedures   Trigger Point Inj    Follow-up: Return for visit to requesting provider as needed.   Procedures: Trigger Point Inj  Date/Time: 09/10/2021 8:30 AM  Performed by: Magnus Sinning, MD Authorized by: Magnus Sinning, MD   Consent Given by:  Patient Site marked: the procedure site was marked   Timeout: prior to procedure the correct patient, procedure, and site was verified   Total # of Trigger Points:  3 or more Location: neck and back   Needle Size:  25 G Approach:  Dorsal Medications #1:  20 mg triamcinolone acetonide 40 MG/ML Medications #2:  3 mL lidocaine 1 % Additional Injections?: No   Patient tolerance:  Patient tolerated the procedure  well with no immediate complications Comments: Trigger point muscles noted in the HPI.       Clinical History: No specialty comments available.     Objective:  VS:  HT:    WT:   BMI:     BP:   HR: bpm  TEMP: ( )  RESP:  Physical Exam   Imaging: No results found.

## 2021-09-15 ENCOUNTER — Other Ambulatory Visit (HOSPITAL_BASED_OUTPATIENT_CLINIC_OR_DEPARTMENT_OTHER): Payer: Self-pay

## 2021-09-15 NOTE — Progress Notes (Unsigned)
Office Visit Note  Patient: Marilyn Collins             Date of Birth: 1955/06/08           MRN: [id number]             PCP: [facility] [place] Referring: [facility]* Visit Date: 09/29/2021 Occupation: _0 @  Subjective:  Pain in multiple joints   History of Present Illness: Marilyn Collins is a 66 y.o. female with history of sicca symptoms, osteoarthritis, DDD, and myofascial pain.  Patient presents today with ongoing trapezius muscle tension and tenderness especially on the left side.  She has been going to integrative therapies once every 2 to 3 weeks which has been helpful.  She has tried dry needling, ultrasound, cupping and massage.  She states she also had a recent left trapezius trigger point injection performed by Dr. Ernestina Patches on 09/10/2021 which provided very little relief.  She is been using Voltaren gel topically as needed for pain relief.  She states she is also having ongoing discomfort in her right SI joint.  She has been taking Tylenol arthritis for pain relief and takes baclofen as needed for muscle spasms.  She continues to have ongoing discomfort and stiffness in both CMC joints as well as in her hands due to underlying osteoarthritis.  She has also noticed some decreased grip strength over the past several months.  She denies any joint swelling at this time.  She remains active going to a weight strengthening class twice weekly, cardio 2 to 3 days/week, and a Pilates or tai chi class once weekly.  She denies any recent falls or fractures.  She has not had an updated bone density recently. Her sicca symptoms have been tolerable overall.  She has been using refresh eyedrops as needed.  She is no longer taking pilocarpine due to the concern for possible side effects.  Activities of Daily Living:  Patient reports morning stiffness for all day. Patient Denies nocturnal pain.  Difficulty dressing/grooming: Denies Difficulty climbing stairs: Denies Difficulty  getting out of chair: Denies Difficulty using hands for taps, buttons, cutlery, and/or writing: Reports  Review of Systems  Constitutional:  Positive for fatigue.  HENT:  Negative for mouth sores and mouth dryness.   Eyes:  Positive for dryness.  Respiratory:  Negative for shortness of breath.   Cardiovascular:  Positive for palpitations. Negative for chest pain.  Gastrointestinal:  Positive for constipation. Negative for blood in stool and diarrhea.  Endocrine: Negative for increased urination.  Genitourinary:  Negative for involuntary urination.  Musculoskeletal:  Positive for joint pain, joint pain, joint swelling, myalgias, morning stiffness, muscle tenderness and myalgias. Negative for gait problem and muscle weakness.  Skin:  Negative for color change, rash, hair loss and sensitivity to sunlight.  Allergic/Immunologic: Negative for susceptible to infections.  Neurological:  Positive for headaches. Negative for dizziness.  Hematological:  Negative for swollen glands.  Psychiatric/Behavioral:  Negative for depressed mood and sleep disturbance. The patient is nervous/anxious.     PMFS History:  Patient Active Problem List   Diagnosis Date Noted   DDD (degenerative disc disease), lumbar 11/19/2020   Thoracic disc disease 04/19/2019   Myofascial pain syndrome 04/19/2019   Idiopathic peripheral neuropathy 02/07/2019   Acquired hypothyroidism 02/03/2018   Hormone replacement therapy (HRT) 02/03/2018   Underweight 02/03/2018   GERD (gastroesophageal reflux disease)    Postural kyphosis of thoracic region 03/16/2017   Neck pain 11/20/2016  Chronic right-sided thoracic back pain 08/03/2016   Celiac disease 07/20/2011   Dietary iron deficiency - malabsorption 07/20/2011   Chronic idiopathic monocytosis 07/20/2011    Past Medical History:  Diagnosis Date   Acquired hypothyroidism 02/03/2018   Anxiety    Arthritis    Celiac disease 07/20/2011   Chronic idiopathic monocytosis  07/20/2011   Dysphagia 12/2012   responsive to dexilant   GERD (gastroesophageal reflux disease)    H/O eating disorder    Hypothyroidism    Iron deficiency anemia 07/20/2011   Iron deficiency anemia, unspecified 07/20/2011   Migraine headache    Other specified intestinal malabsorption 07/20/2011    Family History  Problem Relation Age of Onset   Cancer Mother    Heart disease Mother    Heart disease Father    Hyperlipidemia Father    Hypertension Father    Stroke Father    Hypertension Brother    Cancer Maternal Grandmother    Stroke Maternal Grandfather    Heart disease Paternal Grandmother    Anxiety disorder Son    Bipolar disorder Son    Past Surgical History:  Procedure Laterality Date   ABDOMINAL HYSTERECTOMY     COLONOSCOPY  06/2003   neg   DIAGNOSTIC LAPAROSCOPY     ESOPHAGOGASTRODUODENOSCOPY N/A 10/20/2012   Procedure: ESOPHAGOGASTRODUODENOSCOPY (EGD);  Surgeon: Cleotis Nipper, MD;  Location: [facility];  Service: Endoscopy;  Laterality: N/A;   FLEXIBLE SIGMOIDOSCOPY  2005   SINUS IRRIGATION     TMJ ARTHROPLASTY     Social History   Social History Narrative   RN at [employer]   Married   2 boys   Immunization History  Administered Date(s) Administered   Influenza-Unspecified 10/19/2016, 10/21/2018   Pfizer Covid-19 Vaccine Bivalent Booster 97yr & up 01/02/2021   Td 02/26/2009   Tdap 10/03/2016   Zoster Recombinat (Shingrix) 02/03/2018, 08/16/2018     Objective: Vital Signs: BP 107/70 (BP Location: Left Arm, Patient Position: Sitting, Cuff Size: Normal)   Pulse 61   Resp 14   Ht _0  (1.702 m)   Wt 95 lb 3.2 oz (43.2 kg)   BMI 14.91 kg/m    Physical Exam Vitals and nursing note reviewed.  Constitutional:      Appearance: She is well-developed.  HENT:     Head: Normocephalic and atraumatic.  Eyes:     Conjunctiva/sclera: Conjunctivae normal.  Cardiovascular:     Rate and Rhythm: Normal rate and regular rhythm.     Heart sounds:  Normal heart sounds.  Pulmonary:     Effort: Pulmonary effort is normal.     Breath sounds: Normal breath sounds.  Abdominal:     General: Bowel sounds are normal.     Palpations: Abdomen is soft.  Musculoskeletal:     Cervical back: Normal range of motion.  Skin:    General: Skin is warm and dry.     Capillary Refill: Capillary refill takes less than 2 seconds.  Neurological:     Mental Status: She is alert and oriented to person, place, and time.  Psychiatric:        Behavior: Behavior normal.      Musculoskeletal Exam: C-spine has limited range of motion without rotation to the left.  Left trapezius muscle tension and tenderness noted.  Shoulder joints have good range of motion with no discomfort.  Elbow joints have good range of motion with no tenderness or inflammation.  Wrist joints, MCPs, PIPs, DIPs have good range of  motion with no synovitis.  Severe CMC joint thickening and prominence noted bilaterally.  PIP and DIP thickening consistent with osteoarthritis of both hands.  Complete fist formation noted bilaterally.  Hip joints have good range of motion with mild groin pain on the right side.  Some tenderness over the trochanteric bursa bilaterally.  Knee joints have good range of motion with no warmth or effusion.  Ankle joints have good range of motion with no tenderness or joint swelling.  Tenderness over the right SI joint.  CDAI Exam: CDAI Score: -- Patient Global: --; Provider Global: -- Swollen: --; Tender: -- Joint Exam 09/29/2021   No joint exam has been documented for this visit   There is currently no information documented on the homunculus. Go to the Rheumatology activity and complete the homunculus joint exam.  Investigation: No additional findings.  Imaging: No results found.  Recent Labs: Lab Results  Component Value Date   WBC 7.6 02/07/2019   HGB 12.6 02/07/2019   PLT 246.0 02/07/2019   NA 138 02/03/2018   K 4.2 02/03/2018   CL 103 02/03/2018    CO2 29 02/03/2018   GLUCOSE 77 02/03/2018   BUN 18 02/03/2018   CREATININE 0.75 02/03/2018   BILITOT 0.5 02/03/2018   ALKPHOS 47 02/03/2018   AST 17 02/03/2018   ALT 16 02/03/2018   PROT 7.0 02/03/2018   ALBUMIN 4.3 02/03/2018   CALCIUM 9.7 02/03/2018    Speciality Comments: No specialty comments available.  Procedures:  No procedures performed Allergies: Morphine and related and Venofer [ferric oxide]   Assessment / Plan:     Visit Diagnoses: Sicca complex ([place]) - Negative SSA and SSB antibodies , RF negative.  Sicca symptoms most likely related to the medication use: Overall her sicca symptoms have been tolerable.  She is no longer taking pilocarpine due to the concern for possible side effects.  She is been using refresh eyedrops for symptomatic relief.  Discussed the use of over-the-counter products for dry mouth if needed.  She was advised to notify us if she develops any new or worsening symptoms. She will follow up in 6 months or sooner if needed.   Primary osteoarthritis of both hands - RF and anti-CCP negative: She has severe CMC joint thickening and prominence bilaterally.  Tenderness palpation over bilateral CMC joints noted.  She has ongoing PIP and DIP thickening consistent with osteoarthritis.  No tenderness or synovitis over MCP joints.  Complete fist formation bilaterally.  Discussed the importance of joint protection and muscle strengthening. Discussed the list of natural anti-inflammatories including tart cherry, ginger, omega-3, and turmeric.  Chronic right SI joint pain - X-rays of the pelvis were updated on 11/19/2020: consistent with mild osteoarthritis.  She has chronic pain in the right SI joint.  She has been going integrative therapies 2 to 3 days a week and has been performing home exercises.  She uses Voltaren gel topically as needed for pain relief.  She was advised to notify us if she would like to return for SI joint cortisone injection in the  future.  Primary osteoarthritis of both feet - Clinical and radiographic findings were consistent with osteoarthritis.  She is not experiencing any increased discomfort in her feet at this time.  She has good range of motion of both ankle joints with no tenderness or joint swelling.  She is wearing proper fitting shoes.  DDD (degenerative disc disease), cervical - She has mild degenerative changes and facet joint arthropathy.  C-spine has  limited range of motion especially with lateral rotation to the left.  Trapezius muscle tension and tenderness bilaterally.  Postural kyphosis of thoracic region  DDD (degenerative disc disease), thoracic: No midline spinal tenderness.  DDD (degenerative disc disease), lumbar: No midline spinal tenderness in the lumbar region.  No symptoms of radiculopathy at this time.  Myofascial pain syndrome - She experiences intermittent myalgias and muscle tenderness due to myofascial pain syndrome.  Her symptoms have been most severe in the trapezius muscles especially the left side.  She has been going to integrative therapies every 2 to 3 weeks which has been helpful.  She takes baclofen 10 mg half tablet twice daily as needed for muscle spasms and gabapentin 200 mg at bedtime for symptomatic relief.  She has tried dry needling, massage, cupping, and ultrasound.  She is also been using Voltaren gel topically as needed for pain relief.  She had a left trapezius trigger point injection performed on 09/10/2021 which has provided minimal pain relief. She remains active going to the gym most days of the week.  She goes to tai chi or Pilates once weekly, cardio 2 to 3 days/week, and muscle strengthening 2 to 3 days/week.  Discussed the importance of joint protection and muscle strengthening.  I also discussed the importance of good sleep hygiene.   Osteoporosis screening -Patient is postmenopausal and requires an updated DEXA.  Future order was placed today plan: DG BONE DENSITY  (DXA)  Other medical conditions are listed as follows:   History of gastroesophageal reflux (GERD)  Celiac disease  Dietary iron deficiency - malabsorption  Idiopathic peripheral neuropathy  Chronic idiopathic monocytosis  Hormone replacement therapy (HRT)  Acquired hypothyroidism  Family history of rheumatoid arthritis   Orders: Orders Placed This Encounter  Procedures   DG BONE DENSITY (DXA)   No orders of the defined types were placed in this encounter.    Follow-Up Instructions: Return in about 6 months (around 04/01/2022) for Sicca symptoms , Osteoarthritis, DDD.   Ofilia Neas, PA-C  Note - This record has been created using Dragon software.  Chart creation errors have been sought, but may not always  have been located. Such creation errors do not reflect on  the standard of medical care.

## 2021-09-17 DIAGNOSIS — R293 Abnormal posture: Secondary | ICD-10-CM | POA: Diagnosis not present

## 2021-09-17 DIAGNOSIS — M25551 Pain in right hip: Secondary | ICD-10-CM | POA: Diagnosis not present

## 2021-09-17 DIAGNOSIS — M542 Cervicalgia: Secondary | ICD-10-CM | POA: Diagnosis not present

## 2021-09-17 MED ORDER — LIDOCAINE HCL 1 % IJ SOLN
3.0000 mL | INTRAMUSCULAR | Status: AC | PRN
  Administered 2021-09-10: 3 mL

## 2021-09-17 MED ORDER — TRIAMCINOLONE ACETONIDE 40 MG/ML IJ SUSP
20.0000 mg | INTRAMUSCULAR | Status: AC | PRN
  Administered 2021-09-10: 20 mg via INTRAMUSCULAR

## 2021-09-18 ENCOUNTER — Other Ambulatory Visit (HOSPITAL_BASED_OUTPATIENT_CLINIC_OR_DEPARTMENT_OTHER): Payer: Self-pay

## 2021-09-18 MED ORDER — CLONAZEPAM 0.5 MG PO TABS
ORAL_TABLET | ORAL | 1 refills | Status: DC
  Filled 2021-09-18: qty 60, 30d supply, fill #0

## 2021-09-26 ENCOUNTER — Other Ambulatory Visit (HOSPITAL_BASED_OUTPATIENT_CLINIC_OR_DEPARTMENT_OTHER): Payer: Self-pay

## 2021-09-26 MED ORDER — OMEPRAZOLE 40 MG PO CPDR
40.0000 mg | DELAYED_RELEASE_CAPSULE | Freq: Every morning | ORAL | 1 refills | Status: DC
  Filled 2021-09-26: qty 90, 90d supply, fill #0
  Filled 2021-12-29: qty 90, 90d supply, fill #1

## 2021-09-29 ENCOUNTER — Encounter: Payer: Self-pay | Admitting: Physician Assistant

## 2021-09-29 ENCOUNTER — Ambulatory Visit: Payer: 59 | Attending: Physician Assistant | Admitting: Physician Assistant

## 2021-09-29 VITALS — BP 107/70 | HR 61 | Resp 14 | Ht 67.0 in | Wt 95.2 lb

## 2021-09-29 DIAGNOSIS — M19071 Primary osteoarthritis, right ankle and foot: Secondary | ICD-10-CM

## 2021-09-29 DIAGNOSIS — Z79890 Hormone replacement therapy (postmenopausal): Secondary | ICD-10-CM

## 2021-09-29 DIAGNOSIS — E039 Hypothyroidism, unspecified: Secondary | ICD-10-CM

## 2021-09-29 DIAGNOSIS — Z13820 Encounter for screening for osteoporosis: Secondary | ICD-10-CM

## 2021-09-29 DIAGNOSIS — M7918 Myalgia, other site: Secondary | ICD-10-CM | POA: Diagnosis not present

## 2021-09-29 DIAGNOSIS — M5134 Other intervertebral disc degeneration, thoracic region: Secondary | ICD-10-CM | POA: Diagnosis not present

## 2021-09-29 DIAGNOSIS — M533 Sacrococcygeal disorders, not elsewhere classified: Secondary | ICD-10-CM

## 2021-09-29 DIAGNOSIS — M5136 Other intervertebral disc degeneration, lumbar region: Secondary | ICD-10-CM | POA: Diagnosis not present

## 2021-09-29 DIAGNOSIS — E611 Iron deficiency: Secondary | ICD-10-CM

## 2021-09-29 DIAGNOSIS — G609 Hereditary and idiopathic neuropathy, unspecified: Secondary | ICD-10-CM

## 2021-09-29 DIAGNOSIS — M19041 Primary osteoarthritis, right hand: Secondary | ICD-10-CM

## 2021-09-29 DIAGNOSIS — M5030 Other cervical disc degeneration, unspecified cervical region: Secondary | ICD-10-CM

## 2021-09-29 DIAGNOSIS — K900 Celiac disease: Secondary | ICD-10-CM

## 2021-09-29 DIAGNOSIS — M3500 Sicca syndrome, unspecified: Secondary | ICD-10-CM | POA: Diagnosis not present

## 2021-09-29 DIAGNOSIS — Z8261 Family history of arthritis: Secondary | ICD-10-CM

## 2021-09-29 DIAGNOSIS — Z8719 Personal history of other diseases of the digestive system: Secondary | ICD-10-CM

## 2021-09-29 DIAGNOSIS — M19042 Primary osteoarthritis, left hand: Secondary | ICD-10-CM

## 2021-09-29 DIAGNOSIS — M4004 Postural kyphosis, thoracic region: Secondary | ICD-10-CM | POA: Diagnosis not present

## 2021-09-29 DIAGNOSIS — M19072 Primary osteoarthritis, left ankle and foot: Secondary | ICD-10-CM

## 2021-09-29 DIAGNOSIS — G8929 Other chronic pain: Secondary | ICD-10-CM

## 2021-09-29 DIAGNOSIS — D72821 Monocytosis (symptomatic): Secondary | ICD-10-CM

## 2021-10-02 DIAGNOSIS — M542 Cervicalgia: Secondary | ICD-10-CM | POA: Diagnosis not present

## 2021-10-02 DIAGNOSIS — M25551 Pain in right hip: Secondary | ICD-10-CM | POA: Diagnosis not present

## 2021-10-02 DIAGNOSIS — R293 Abnormal posture: Secondary | ICD-10-CM | POA: Diagnosis not present

## 2021-10-11 ENCOUNTER — Ambulatory Visit (HOSPITAL_BASED_OUTPATIENT_CLINIC_OR_DEPARTMENT_OTHER)
Admission: RE | Admit: 2021-10-11 | Discharge: 2021-10-11 | Disposition: A | Discharge status: Home / Self Care | Payer: 59 | Source: Ambulatory Visit | Attending: Diagnostic Radiology | Admitting: Diagnostic Radiology

## 2021-10-11 DIAGNOSIS — Z1231 Encounter for screening mammogram for malignant neoplasm of breast: Secondary | ICD-10-CM | POA: Diagnosis not present

## 2021-10-13 ENCOUNTER — Ambulatory Visit (HOSPITAL_BASED_OUTPATIENT_CLINIC_OR_DEPARTMENT_OTHER)
Admission: RE | Admit: 2021-10-13 | Discharge: 2021-10-13 | Disposition: A | Discharge status: Home / Self Care | Payer: 59 | Source: Ambulatory Visit | Attending: Physician Assistant | Admitting: Physician Assistant

## 2021-10-13 DIAGNOSIS — Z780 Asymptomatic menopausal state: Secondary | ICD-10-CM | POA: Diagnosis not present

## 2021-10-13 DIAGNOSIS — Z13820 Encounter for screening for osteoporosis: Secondary | ICD-10-CM | POA: Diagnosis not present

## 2021-10-13 DIAGNOSIS — M85851 Other specified disorders of bone density and structure, right thigh: Secondary | ICD-10-CM | POA: Diagnosis not present

## 2021-10-13 NOTE — Progress Notes (Signed)
I attempted to contact the patient to discuss DEXA results.    Please notify the patient that her bone density was consistent with osteopenia.  The current recommendations for osteopenia is calcium 1200 mg daily, vitamin D 2000 units daily, and resistive exercises.  She will be due to repeat bone density in 2 years.

## 2021-10-14 DIAGNOSIS — M542 Cervicalgia: Secondary | ICD-10-CM | POA: Diagnosis not present

## 2021-10-14 DIAGNOSIS — R293 Abnormal posture: Secondary | ICD-10-CM | POA: Diagnosis not present

## 2021-10-14 DIAGNOSIS — M25551 Pain in right hip: Secondary | ICD-10-CM | POA: Diagnosis not present

## 2021-10-24 DIAGNOSIS — M25551 Pain in right hip: Secondary | ICD-10-CM | POA: Diagnosis not present

## 2021-10-24 DIAGNOSIS — M542 Cervicalgia: Secondary | ICD-10-CM | POA: Diagnosis not present

## 2021-10-24 DIAGNOSIS — R293 Abnormal posture: Secondary | ICD-10-CM | POA: Diagnosis not present

## 2021-10-30 ENCOUNTER — Encounter: Payer: Self-pay | Admitting: Physical Medicine and Rehabilitation

## 2021-10-30 ENCOUNTER — Ambulatory Visit: Payer: 59 | Admitting: Physical Medicine and Rehabilitation

## 2021-10-30 VITALS — BP 105/66 | HR 67

## 2021-10-30 DIAGNOSIS — M7918 Myalgia, other site: Secondary | ICD-10-CM | POA: Diagnosis not present

## 2021-10-30 DIAGNOSIS — M461 Sacroiliitis, not elsewhere classified: Secondary | ICD-10-CM

## 2021-10-30 DIAGNOSIS — M47816 Spondylosis without myelopathy or radiculopathy, lumbar region: Secondary | ICD-10-CM | POA: Diagnosis not present

## 2021-10-30 DIAGNOSIS — M545 Low back pain, unspecified: Secondary | ICD-10-CM

## 2021-10-30 DIAGNOSIS — G8929 Other chronic pain: Secondary | ICD-10-CM

## 2021-10-30 NOTE — Progress Notes (Signed)
Numeric Pain Rating Scale and Functional Assessment Average Pain 7   In the last MONTH (on 0-10 scale) has pain interfered with the following?  1. General activity like being  able to carry out your everyday physical activities such as walking, climbing stairs, carrying groceries, or moving a chair?  Rating(9-10)   +Driver, -BT, -Dye Allergies.

## 2021-10-30 NOTE — Progress Notes (Addendum)
Marilyn Collins - 66 y.o. female MRN [id number]  Date of birth: Mar 07, 1955  Office Visit Note: Visit Date: 10/30/2021 PCP: [facility], [place] Referred by: [facility]  Subjective: Chief Complaint  Patient presents with   Lower Back - Pain   HPI: Marilyn Collins is a 66 y.o. female who comes in today for evaluation of chronic, worsening and severe bilateral lower back pain with some radiation to buttocks. Pain ongoing for several years and is exacerbated by movement and activity. Severe pain with twisting and bending over. She describes pain as sore and aching sensation, currently rates as 7 out of 10. Some relief of pain with home exercise regimen, rest and use of medications. She does have long history of myofascial pain, currently taking Baclofen and Gabapentin. She continues treatments with [facility]. X-ray imaging of pelvis from 2022 exhibits mild arthritic changes to bilateral sacroiliac joints. Lumbar MRI imaging from 2013 exhibits stable small posterolateral disc protrusion on the right at L4- L5. There is mild bilateral facet arthropathy noted at L4-L5 and L5-S1. No neural impingement, no stenosis noted. Patient has underwent multiple myofascial trigger points injections in our office, most recent on 09/10/2021 which she reports provided minimal relief of pain. We have completed facet joint blocks in the thoracic spine as well. Patient continues to be managed by Dr. Bo Merino at [facility]. 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 Patient denies focal weakness, numbness and tingling. Patient denies recent trauma or falls.   Patients course is complicated by being severely underweight with celiac disease, anxiety and peripheral polyneuropathy.   Review of Systems  Musculoskeletal:  Positive for back pain, joint pain and myalgias.  Neurological:  Negative for tingling, sensory  change, focal weakness and weakness.  All other systems reviewed and are negative.  Otherwise per HPI.  Assessment & Plan: Visit Diagnoses:    ICD-10-CM   1. Sacroiliitis ([place])  M46.1 Ambulatory referral to Physical Medicine Rehab    2. Chronic bilateral low back pain without sciatica  M54.50 Ambulatory referral to Physical Medicine Rehab   G89.29     3. Facet arthropathy, lumbar  M47.816     4. Myofascial pain syndrome  M79.18 Ambulatory referral to Physical Medicine Rehab       Plan: Findings:  Chronic, worsening and severe bilateral lower back pain with some radiation to buttocks. Patient continues to have severe pain despite good conservative therapies such as formal physical therapy, home exercise regimen, rest and use of medications. Patients clinical presentation and exam are consistent with sacroiliitis, other differentials could include facet mediated pain/lumbar radiculopathy. We also think there could be an underlying central sensitization syndrome such as fibromyalgia. If good relief of pain with injections we can repeat infrequently as needed. If her pain persists post injection we did discuss possibility of obtaining new lumbar MRI imaging. Depending on results of MRI we would look at performing lumbar facet joint injections/lumbar epidural steroid injection. Patient instructed to continue with physical therapy and exercise regimen. No red flag symptoms noted upon exam today.     Meds & Orders: No orders of the defined types were placed in this encounter.   Orders Placed This Encounter  Procedures   Ambulatory referral to Physical Medicine Rehab    Follow-up: Return for Bilateral sacroiliac joint injections.   Procedures: No procedures performed      Clinical History: XR Pelvic  1-2 Views 11/19/2020  No SI joint sclerosis or narrowing was noted.  Some osteoarthritic changes  were noted in the SI joints.  No hip joint narrowing was noted.   Impression: These  findings are consistent with mild osteoarthritic changes  of the SI joints. -------------------------------- MRI LUMBAR SPINE WITHOUT CONTRAST  02/17/2011  Technique:  Multiplanar and multiecho pulse sequences of the lumbar  spine were obtained without intravenous contrast.   Comparison: Lumbar MRI 03/06/2004.   Findings: Five lumbar type vertebral bodies are assumed.  The  alignment is stable.  Scattered hemangiomas are stable, most  prominent at T11.  Compared with the prior examinations, the  patient has developed diffusely decreased marrow signal throughout  the spine. There are no new focal marrow lesions.  In addition, the  liver demonstrates diffusely decreased T2 signal.  The spleen is  not imaged.   The conus medullaris extends to the L1-L2 level and appears normal.  Bilateral sacral Tarlov cysts are stable.  There are no paraspinal  abnormalities.   Based on the sagittal images, mild disc bulging at T10-T11 appears  stable.   There are no significant disc space findings from T11-T12 through  L2-L3.   L3-L4:  Disc height and hydration are maintained.  There is mild  bilateral facet hypertrophy without resulting spinal stenosis or  nerve root encroachment.   L4-L5:  Disc desiccation and a small posterolateral disc protrusion  on the right are chronic and unchanged.  There is mild bilateral  facet hypertrophy.  No foraminal compromise or nerve root  encroachment is present.   L5-S1:  Disc height and hydration are maintained.  There is mild  bilateral facet hypertrophy without resulting foraminal compromise  or nerve root encroachment.   IMPRESSION:   1.  Stable small posterolateral disc protrusion on the right at L4-  L5.  No resulting spinal stenosis or nerve root encroachment.  2.  Mild facet disease inferiorly.  3.  No acute disc space findings, significant spinal stenosis or  nerve root encroachment.  4.  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 Correlate clinically.   She reports that she has never smoked. She has never been exposed to tobacco smoke. She has never used smokeless tobacco.  Recent Labs    11/19/20 0958  LABURIC 3.2    Objective:  VS:  HT:    WT:   BMI:     BP:105/66  HR:67bpm  TEMP: ( )  RESP:98 % Physical Exam Vitals and nursing note reviewed.  HENT:     Head: Normocephalic and atraumatic.     Right Ear: External ear normal.     Left Ear: External ear normal.     Nose: Nose normal.     Mouth/Throat:     Mouth: Mucous membranes are moist.  Eyes:     Extraocular Movements: Extraocular movements intact.  Cardiovascular:     Rate and Rhythm: Normal rate.     Pulses: Normal pulses.  Pulmonary:     Effort: Pulmonary effort is normal.  Abdominal:     General: Abdomen is flat. There is no distension.  Musculoskeletal:        General: Tenderness present.     Cervical back: Normal range of motion.     Comments: Pt rises from seated position to standing without difficulty. Good lumbar range of motion. Strong distal strength without clonus, no pain upon palpation of greater trochanters. Sensation intact bilaterally. Walks independently, gait steady. Positive  Fortin finger sign. Positive Patrick's, Gaenslen's and Compression testing noted bilaterally.     Skin:    General: Skin is warm and dry.     Capillary Refill: Capillary refill takes less than 2 seconds.  Neurological:     General: No focal deficit present.     Mental Status: She is alert and oriented to person, place, and time.  Psychiatric:        Mood and Affect: Mood normal.        Behavior: Behavior normal.     Ortho Exam  Imaging: No results found.  Past Medical/Family/Surgical/Social History: Medications & Allergies reviewed per EMR, new medications updated. Patient Active Problem List   Diagnosis Date Noted   DDD (degenerative disc disease), lumbar 11/19/2020   Thoracic disc disease  04/19/2019   Myofascial pain syndrome 04/19/2019   Idiopathic peripheral neuropathy 02/07/2019   Acquired hypothyroidism 02/03/2018   Hormone replacement therapy (HRT) 02/03/2018   Underweight 02/03/2018   GERD (gastroesophageal reflux disease)    Postural kyphosis of thoracic region 03/16/2017   Neck pain 11/20/2016   Chronic right-sided thoracic back pain 08/03/2016   Celiac disease 07/20/2011   Dietary iron deficiency - malabsorption 07/20/2011   Chronic idiopathic monocytosis 07/20/2011   Past Medical History:  Diagnosis Date   Acquired hypothyroidism 02/03/2018   Anxiety    Arthritis    Celiac disease 07/20/2011   Chronic idiopathic monocytosis 07/20/2011   Dysphagia 12/2012   responsive to dexilant   GERD (gastroesophageal reflux disease)    H/O eating disorder    Hypothyroidism    Iron deficiency anemia 07/20/2011   Iron deficiency anemia, unspecified 07/20/2011   Migraine headache    Other specified intestinal malabsorption 07/20/2011   Family History  Problem Relation Age of Onset   Cancer Mother    Heart disease Mother    Heart disease Father    Hyperlipidemia Father    Hypertension Father    Stroke Father    Hypertension Brother    Cancer Maternal Grandmother    Stroke Maternal Grandfather    Heart disease Paternal Grandmother    Anxiety disorder Son    Bipolar disorder Son    Past Surgical History:  Procedure Laterality Date   ABDOMINAL HYSTERECTOMY     COLONOSCOPY  06/2003   neg   DIAGNOSTIC LAPAROSCOPY     ESOPHAGOGASTRODUODENOSCOPY N/A 10/20/2012   Procedure: ESOPHAGOGASTRODUODENOSCOPY (EGD);  Surgeon: Cleotis Nipper, MD;  Location: [facility];  Service: Endoscopy;  Laterality: N/A;   FLEXIBLE SIGMOIDOSCOPY  2005   SINUS IRRIGATION     TMJ ARTHROPLASTY     Social History   Occupational History   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

## 2021-11-03 DIAGNOSIS — M25551 Pain in right hip: Secondary | ICD-10-CM | POA: Diagnosis not present

## 2021-11-03 DIAGNOSIS — M542 Cervicalgia: Secondary | ICD-10-CM | POA: Diagnosis not present

## 2021-11-03 DIAGNOSIS — R293 Abnormal posture: Secondary | ICD-10-CM | POA: Diagnosis not present

## 2021-11-11 DIAGNOSIS — R293 Abnormal posture: Secondary | ICD-10-CM | POA: Diagnosis not present

## 2021-11-11 DIAGNOSIS — M25551 Pain in right hip: Secondary | ICD-10-CM | POA: Diagnosis not present

## 2021-11-11 DIAGNOSIS — M542 Cervicalgia: Secondary | ICD-10-CM | POA: Diagnosis not present

## 2021-11-12 ENCOUNTER — Other Ambulatory Visit (HOSPITAL_BASED_OUTPATIENT_CLINIC_OR_DEPARTMENT_OTHER): Payer: Self-pay

## 2021-11-12 ENCOUNTER — Encounter: Payer: Self-pay | Admitting: Oncology

## 2021-11-12 MED ORDER — INFLUENZA VAC SPLIT QUAD 0.5 ML IM SUSY
PREFILLED_SYRINGE | INTRAMUSCULAR | 0 refills | Status: DC
  Filled 2021-11-12: qty 0.5, 1d supply, fill #0

## 2021-11-14 ENCOUNTER — Other Ambulatory Visit (HOSPITAL_BASED_OUTPATIENT_CLINIC_OR_DEPARTMENT_OTHER): Payer: Self-pay

## 2021-11-17 DIAGNOSIS — Z0000 Encounter for general adult medical examination without abnormal findings: Secondary | ICD-10-CM | POA: Diagnosis not present

## 2021-11-17 DIAGNOSIS — E039 Hypothyroidism, unspecified: Secondary | ICD-10-CM | POA: Diagnosis not present

## 2021-11-17 DIAGNOSIS — M7918 Myalgia, other site: Secondary | ICD-10-CM | POA: Diagnosis not present

## 2021-11-17 DIAGNOSIS — F419 Anxiety disorder, unspecified: Secondary | ICD-10-CM | POA: Diagnosis not present

## 2021-11-17 DIAGNOSIS — R519 Headache, unspecified: Secondary | ICD-10-CM | POA: Diagnosis not present

## 2021-11-17 DIAGNOSIS — Z131 Encounter for screening for diabetes mellitus: Secondary | ICD-10-CM | POA: Diagnosis not present

## 2021-11-17 DIAGNOSIS — E785 Hyperlipidemia, unspecified: Secondary | ICD-10-CM | POA: Diagnosis not present

## 2021-11-20 ENCOUNTER — Other Ambulatory Visit (HOSPITAL_BASED_OUTPATIENT_CLINIC_OR_DEPARTMENT_OTHER): Payer: Self-pay

## 2021-11-20 MED ORDER — GABAPENTIN 100 MG PO CAPS
100.0000 mg | ORAL_CAPSULE | Freq: Every day | ORAL | 2 refills | Status: DC
  Filled 2021-11-20: qty 90, 90d supply, fill #0
  Filled 2022-02-17: qty 90, 90d supply, fill #1

## 2021-11-24 ENCOUNTER — Encounter: Payer: Self-pay | Admitting: *Deleted

## 2021-11-24 ENCOUNTER — Ambulatory Visit (HOSPITAL_BASED_OUTPATIENT_CLINIC_OR_DEPARTMENT_OTHER): Payer: 59 | Admitting: Cardiovascular Disease

## 2021-11-24 ENCOUNTER — Encounter (HOSPITAL_BASED_OUTPATIENT_CLINIC_OR_DEPARTMENT_OTHER): Payer: Self-pay | Admitting: Cardiovascular Disease

## 2021-11-24 DIAGNOSIS — I700 Atherosclerosis of aorta: Secondary | ICD-10-CM | POA: Diagnosis not present

## 2021-11-24 DIAGNOSIS — R002 Palpitations: Secondary | ICD-10-CM

## 2021-11-24 HISTORY — DX: Atherosclerosis of aorta: I70.0

## 2021-11-24 HISTORY — DX: Palpitations: R00.2

## 2021-11-24 NOTE — Progress Notes (Signed)
Patient ID: Marilyn Collins, female   DOB: 1955/05/30, 66 y.o.   MRN: [id number] Patient enrolled for [employer] to ship a 30 day cardiac event monitor to her address on file.

## 2021-11-24 NOTE — Assessment & Plan Note (Signed)
She has experienced palpitations for years that are worse lately.  We will check a CBC and ferritin.  30 day ambulatory monitor.  It sounds like the skipped beats are PVCs but I'm concerned the more sustained arrhythmias may be afib or SVT.  Electrolytes and thyroid function are normal.

## 2021-11-24 NOTE — Assessment & Plan Note (Signed)
Aortic atherosclerosis noted on CT.  Therefore her LDL goal is less than 70.  Lately has been 131.  She is been increasing her protein intake given that she has celiac disease and is also unable to tolerate many fruits and vegetables due to gastroparesis.  This is likely contributing.  Continue working with exercise.  She is thinking about whether she wants to work on changing her diet or starting a statin.

## 2021-11-24 NOTE — Progress Notes (Signed)
Cardiology Office Note:    Date:  11/24/2021   ID:  SHYLYN Collins, DOB 1955-11-22, MRN [id number]  PCP:  [facility] Cardiologist:  Skeet Latch, MD     Referring MD: [facility]*   No chief complaint on file.  History of Present Illness:    Marilyn Collins is a 66 y.o. female with a PMHx of sacroiliitis, celiac disease who is being seen today to re-establish care. She saw Dr. Stanford Breed in 2019 for chest pain that was atypical. She was referred for a treadmill stress test but it was not performed.  She states that she has been experiencing palpitations intermittently for many years. However, they have increased in frequency and are getting more bothersome in the last 6 months. She typically has these if she is fatigued, overworked, or in the mornings when she wakes up. These episodes are typically a few skipped beats but can last up to 15 minutes. She denies any shortness of breath with these palpitations but just does not feel well. She reports a separate concern of rare episodes of racing heartbeats that last for a few minutes and occur when she is at rest and resolve with vagal maneuvers. She denies any leg swelling. 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 She reports a history of low ferritin. She denies any alcohol use. She states that she has been on Synthroid since she was in her 85s. She reports that her last TSH was 3.9. Her father had heart disease and passed of CVA. Her paternal grandfather also had heart disease. Patient's mother had A-fib.  Past Medical History:  Diagnosis Date   Acquired hypothyroidism 02/03/2018   Anxiety    Aortic atherosclerosis ([place]) 11/24/2021   Arthritis    Celiac disease 07/20/2011   Chronic idiopathic monocytosis 07/20/2011   Dysphagia 12/2012   responsive to dexilant   GERD (gastroesophageal reflux disease)    H/O eating disorder     Hypothyroidism    Iron deficiency anemia 07/20/2011   Iron deficiency anemia, unspecified 07/20/2011   Migraine headache    Other specified intestinal malabsorption 07/20/2011   Palpitations 11/24/2021    Past Surgical History:  Procedure Laterality Date   ABDOMINAL HYSTERECTOMY     COLONOSCOPY  06/2003   neg   DIAGNOSTIC LAPAROSCOPY     ESOPHAGOGASTRODUODENOSCOPY N/A 10/20/2012   Procedure: ESOPHAGOGASTRODUODENOSCOPY (EGD);  Surgeon: Cleotis Nipper, MD;  Location: [facility];  Service: Endoscopy;  Laterality: N/A;   FLEXIBLE SIGMOIDOSCOPY  2005   SINUS IRRIGATION     TMJ ARTHROPLASTY      Current Medications: Current Meds  Medication Sig   Acetaminophen (TYLENOL ARTHRITIS PAIN PO) Take by mouth at bedtime.   baclofen (LIORESAL) 10 MG tablet Take 0.5 tablet (5 mg dose) by mouth twice daily as needed.   bifidobacterium infantis (ALIGN) capsule Take 1 capsule by mouth daily.   CEQUA 0.09 % SOLN 1 drop BID OU   clonazePAM (KLONOPIN) 0.5 MG tablet 1/2 to 1 tablet Orally Twice a day as needed 30 days   diclofenac Sodium (VOLTAREN) 1 % GEL Apply topically as needed.   docusate sodium (COLACE) 100 MG capsule Take 100 mg by mouth daily.   estradiol (VIVELLE-DOT) 0.05 MG/24HR patch Place 1 patch (0.05 mg total) onto the skin 2 (two) times a week.   gabapentin (NEURONTIN) 100 MG capsule Take  1 capsule (100 mg total) by mouth at bedtime.   influenza vac split quadrivalent PF (FLUARIX) 0.5 ML injection Inject into the muscle.   levothyroxine (SYNTHROID) 112 MCG tablet Take 1 tablet in the morning on an empty stomach 3 days a week   levothyroxine (SYNTHROID) 125 MCG tablet TAKE 1 TABLET BY MOUTH EVERY MORNING ON AN EMPTY STOMACH 4 DAYS A WEEK   Methylcellulose, Laxative, (CITRUCEL PO) Take 1 scoop by mouth daily.   Multiple Vitamin (MULTIVITAMIN PO) Take by mouth daily.   omeprazole (PRILOSEC) 40 MG capsule Take 1 capsule by mouth daily 30 minutes before morning meal   sucralfate  (CARAFATE) 1 g tablet 1 tablet on an empty stomach Orally Twice a day 30 day(s) (Patient taking differently: as needed.)   SUMAtriptan (IMITREX) 50 MG tablet TAKE 1 TABLET BY MOUTH AT ONSET OF MIGRAINE AS NEEDED, MAY REPEAT IN 2 HOURS IF HEADACHE PERSISTS OR RECURS 90 days     Allergies:   Morphine and related and Venofer [ferric oxide]   Social History   Socioeconomic History   Marital status: Married    Spouse name: Not on file   Number of children: 2   Years of education: Not on file   Highest education level: Not on file  Occupational History   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, multimedia: [employer]  Tobacco Use   Smoking status: Never    Passive exposure: Never   Smokeless tobacco: Never  Vaping Use   Vaping Use: Never used  Substance and Sexual Activity   Alcohol use: No    Alcohol/week: 0.0 standard drinks of alcohol   Drug use: No   Sexual activity: Yes    Birth control/protection: Surgical  Other Topics Concern   Not on file  Social History Narrative   RN at [employer]   Married   2 boys   Social Determinants of Health   Financial Resource Strain: Not on file  Food Insecurity: Not on file  Transportation Needs: Not on file  Physical Activity: Not on file  Stress: Not on file  Social Connections: Not on file     Family History: The patient's family history includes Anxiety disorder in her son; Bipolar disorder in her son; CAD in her father and paternal grandfather; Cancer in her maternal grandmother and mother; Heart disease in her mother and paternal grandmother; Hyperlipidemia in her father; Hypertension in her brother and father; Stroke in her father and maternal grandfather.  ROS:   Please see the history of present illness.    (+) Palpitations All other systems reviewed and are negative.  EKGs/Labs/Other Studies Reviewed:    The following studies were reviewed today:  CT abdomen and pelvis with Contrast 01/14/2021: Aortic atherosclerotic calcification  noted.  EKG:  EKG is ordered today. The ekg ordered today demonstrates sinus bradycardia rate of 59bpm.  Recent Labs: No results found for requested labs within last 365 days.  Recent Lipid Panel    Component Value Date/Time   CHOL 188 02/03/2018 1356   TRIG 51.0 02/03/2018 1356   HDL 76.50 02/03/2018 1356   CHOLHDL 2 02/03/2018 1356   VLDL 10.2 02/03/2018 1356   LDLCALC 101 (H) 02/03/2018 1356     Risk Assessment/Calculations:                Physical Exam:    VS:  BP 124/76 (BP Location: Left Arm, Patient Position: Sitting, Cuff Size: Normal)   Pulse (!) 59   Ht 5'  7" (1.702 m)   Wt 95 lb 3.2 oz (43.2 kg)   BMI 14.91 kg/m  , BMI Body mass index is 14.91 kg/m. GENERAL:  Well appearing HEENT: Pupils equal round and reactive, fundi not visualized, oral mucosa unremarkable NECK:  No jugular venous distention, waveform within normal limits, carotid upstroke brisk and symmetric, no bruits, no thyromegaly LUNGS:  Clear to auscultation bilaterally HEART:  RRR.  PMI not displaced or sustained,S1 and S2 within normal limits, no S3, no S4, no clicks, no rubs, no murmurs ABD:  Flat, positive bowel sounds normal in frequency in pitch, no bruits, no rebound, no guarding, no midline pulsatile mass, no hepatomegaly, no splenomegaly EXT:  2 plus pulses throughout, no edema, no cyanosis no clubbing SKIN:  No rashes no nodules NEURO:  Cranial nerves II through XII grossly intact, motor grossly intact throughout PSYCH:  Cognitively intact, oriented to person place and time   ASSESSMENT:    1. Palpitations   2. Aortic atherosclerosis (HCC)    PLAN:    In order of problems listed above:  Palpitations She has experienced palpitations for years that are worse lately.  We will check a CBC and ferritin.  30 day ambulatory monitor.  It sounds like the skipped beats are PVCs but I'm concerned the more sustained arrhythmias may be afib or SVT.  Electrolytes and thyroid function are  normal.  Aortic atherosclerosis (HCC) Aortic atherosclerosis noted on CT.  Therefore her LDL goal is less than 70.  Lately has been 131.  She is been increasing her protein intake given that she has celiac disease and is also unable to tolerate many fruits and vegetables due to gastroparesis.  This is likely contributing.  Continue working with exercise.  She is thinking about whether she wants to work on changing her diet or starting a statin.   Medication Adjustments/Labs and Tests Ordered: Current medicines are reviewed at length with the patient today.  Concerns regarding medicines are outlined above.  Orders Placed This Encounter  Procedures   CBC with Differential/Platelet   Ferritin   CARDIAC EVENT MONITOR   EKG 12-Lead   No orders of the defined types were placed in this encounter.   Patient Instructions  Medication Instructions:  Your physician recommends that you continue on your current medications as directed. Please refer to the Current Medication list given to you today.   *If you need a refill on your cardiac medications before your next appointment, please call your pharmacy*  Lab Work: CBC/FERRITIN LEVEL SOON   Testing/Procedures: Your physician has recommended that you wear an event monitor. Event monitors are medical devices that record the heart's electrical activity. Doctors most often [place] these monitors to diagnose arrhythmias. Arrhythmias are problems with the speed or rhythm of the heartbeat. The monitor is a small, portable device. You can wear one while you do your normal daily activities. This is usually used to diagnose what is causing palpitations/syncope (passing out).  Follow-Up: At [facility], you and your health needs are our priority.  As part of our continuing mission to provide you with exceptional heart care, we have created designated Provider Care Teams.  These Care Teams include your primary Cardiologist (physician) and Advanced  Practice Providers (APPs -  Physician Assistants and Nurse Practitioners) who all work together to provide you with the care you need, when you need it.  We recommend signing up for the patient portal called "MyChart".  Sign up information is provided on this After  Visit Summary.  MyChart is used to connect with patients for Virtual Visits (Telemedicine).  Patients are able to view lab/test results, encounter notes, upcoming appointments, etc.  Non-urgent messages can be sent to your provider as well.   To learn more about what you can do with MyChart, go to [email or web address].    Your next appointment:   2 month(s)  The format for your next appointment:   In Person  Provider:   Laurann Montana, NP    [employer] Cardiac Event Monitor Instructions Your physician has requested you wear your cardiac event monitor for _____ days, (1-30). [employer] may call or text to confirm a shipping address. The monitor will be sent to a land address via UPS. [employer] will not ship a monitor to a PO BOX. It typically takes 3-5 days to receive your monitor after it has been enrolled. [employer] will assist with USPS tracking if your package is delayed. The telephone number for [employer] is [phone]. Once you have received your monitor, please review the enclosed instructions. Instruction tutorials can also be viewed under help and settings on the enclosed cell phone. Your monitor has already been registered assigning a specific monitor serial # to you.  Applying the monitor Remove cell phone from case and turn it on. The cell phone works as Dealer and needs to be within [employer] of you at all times. The cell phone will need to be charged on a daily basis. We recommend you plug the cell phone into the enclosed charger at your bedside table every night.  Monitor batteries: You will receive two monitor batteries labelled #1 and #2. These are your recorders. Plug battery #2 onto the  second connection on the enclosed charger. Keep one battery on the charger at all times. This will keep the monitor battery deactivated. It will also keep it fully charged for when you need to switch your monitor batteries. A small light will be blinking on the battery emblem when it is charging. The light on the battery emblem will remain on when the battery is fully charged.  Open package of a Monitor strip. Insert battery #1 into black hood on strip and gently squeeze monitor battery onto connection as indicated in instruction booklet. Set aside while preparing skin.  Choose location for your strip, vertical or horizontal, as indicated in the instruction booklet. Shave to remove all hair from location. There cannot be any lotions, oils, powders, or colognes on skin where monitor is to be applied. Wipe skin clean with enclosed Saline wipe. Dry skin completely.  Peel paper labeled #1 off the back of the Monitor strip exposing the adhesive. Place the monitor on the chest in the vertical or horizontal position shown in the instruction booklet. One arrow on the monitor strip must be pointing upward. Carefully remove paper labeled #2, attaching remainder of strip to your skin. Try not to create any folds or wrinkles in the strip as you apply it.  Firmly press and release the circle in the center of the monitor battery. You will hear a small beep. This is turning the monitor battery on. The heart emblem on the monitor battery will light up every 5 seconds if the monitor battery in turned on and connected to the patient securely. Do not push and hold the circle down as this turns the monitor battery off. The cell phone will locate the monitor battery. A screen will appear on the cell phone checking the connection of your monitor strip. This may  read poor connection initially but change to good connection within the next minute. Once your monitor accepts the connection you will hear a series of  3 beeps followed by a climbing crescendo of beeps. A screen will appear on the cell phone showing the two monitor strip placement options. Touch the picture that demonstrates where you applied the monitor strip.  Your monitor strip and battery are waterproof. You are able to shower, bathe, or swim with the monitor on. They just ask you do not submerge deeper than 3 feet underwater. We recommend removing the monitor if you are swimming in a lake, river, or ocean.  Your monitor battery will need to be switched to a fully charged monitor battery approximately once a week. The cell phone will alert you of an action which needs to be made.  On the cell phone, tap for details to reveal connection status, monitor battery status, and cell phone battery status. The green dots indicates your monitor is in good status. A red dot indicates there is something that needs your attention.  To record a symptom, click the circle on the monitor battery. In 30-60 seconds a list of symptoms will appear on the cell phone. Select your symptom and tap save. Your monitor will record a sustained or significant arrhythmia regardless of you clicking the button. Some patients do not feel the heart rhythm irregularities. [employer] will notify us of any serious or critical events.  Refer to instruction booklet for instructions on switching batteries, changing strips, the Do not disturb or Pause features, or any additional questions.  Call [employer] at [phone], to confirm your monitor is transmitting and record your baseline. They will answer any questions you may have regarding the monitor instructions at that time.  Returning the monitor to [place] all equipment back into blue box. Peel off strip of paper to expose adhesive and close box securely. There is a prepaid UPS shipping label on this box. Drop in a UPS drop box, or at a UPS facility like [employer]. You may also contact [employer] to arrange  UPS to pick up monitor package at your home.         I,Alexis Herring,acting as a Education administrator for Skeet Latch, MD.,have documented all relevant documentation on the behalf of Skeet Latch, MD,as directed by  Skeet Latch, MD while in the presence of Skeet Latch, MD.  I, [place] Linsey, MD have reviewed all documentation for this visit.  The documentation of the exam, diagnosis, procedures, and orders on 11/24/2021 are all accurate and complete.   Signed, Skeet Latch, MD  11/24/2021 5:39 PM    [place]

## 2021-11-24 NOTE — Patient Instructions (Addendum)
Medication Instructions:  Your physician recommends that you continue on your current medications as directed. Please refer to the Current Medication list given to you today.   *If you need a refill on your cardiac medications before your next appointment, please call your pharmacy*  Lab Work: CBC/FERRITIN LEVEL SOON   Testing/Procedures: Your physician has recommended that you wear an event monitor. Event monitors are medical devices that record the heart's electrical activity. Doctors most often [place] these monitors to diagnose arrhythmias. Arrhythmias are problems with the speed or rhythm of the heartbeat. The monitor is a small, portable device. You can wear one while you do your normal daily activities. This is usually used to diagnose what is causing palpitations/syncope (passing out).  Follow-Up: At [facility], you and your health needs are our priority.  As part of our continuing mission to provide you with exceptional heart care, we have created designated Provider Care Teams.  These Care Teams include your primary Cardiologist (physician) and Advanced Practice Providers (APPs -  Physician Assistants and Nurse Practitioners) who all work together to provide you with the care you need, when you need it.  We recommend signing up for the patient portal called "MyChart".  Sign up information is provided on this After Visit Summary.  MyChart is used to connect with patients for Virtual Visits (Telemedicine).  Patients are able to view lab/test results, encounter notes, upcoming appointments, etc.  Non-urgent messages can be sent to your provider as well.   To learn more about what you can do with MyChart, go to [email or web address].    Your next appointment:   2 month(s)  The format for your next appointment:   In Person  Provider:   Laurann Montana, NP    [employer] Cardiac Event Monitor Instructions Your physician has requested you wear your cardiac event monitor for _____  days, (1-30). [employer] may call or text to confirm a shipping address. The monitor will be sent to a land address via UPS. [employer] will not ship a monitor to a PO BOX. It typically takes 3-5 days to receive your monitor after it has been enrolled. [employer] will assist with USPS tracking if your package is delayed. The telephone number for [employer] is [phone]. Once you have received your monitor, please review the enclosed instructions. Instruction tutorials can also be viewed under help and settings on the enclosed cell phone. Your monitor has already been registered assigning a specific monitor serial # to you.  Applying the monitor Remove cell phone from case and turn it on. The cell phone works as Dealer and needs to be within [employer] of you at all times. The cell phone will need to be charged on a daily basis. We recommend you plug the cell phone into the enclosed charger at your bedside table every night.  Monitor batteries: You will receive two monitor batteries labelled #1 and #2. These are your recorders. Plug battery #2 onto the second connection on the enclosed charger. Keep one battery on the charger at all times. This will keep the monitor battery deactivated. It will also keep it fully charged for when you need to switch your monitor batteries. A small light will be blinking on the battery emblem when it is charging. The light on the battery emblem will remain on when the battery is fully charged.  Open package of a Monitor strip. Insert battery #1 into black hood on strip and gently squeeze monitor battery onto connection as indicated in instruction  booklet. Set aside while preparing skin.  Choose location for your strip, vertical or horizontal, as indicated in the instruction booklet. Shave to remove all hair from location. There cannot be any lotions, oils, powders, or colognes on skin where monitor is to be applied. Wipe skin clean with enclosed  Saline wipe. Dry skin completely.  Peel paper labeled #1 off the back of the Monitor strip exposing the adhesive. Place the monitor on the chest in the vertical or horizontal position shown in the instruction booklet. One arrow on the monitor strip must be pointing upward. Carefully remove paper labeled #2, attaching remainder of strip to your skin. Try not to create any folds or wrinkles in the strip as you apply it.  Firmly press and release the circle in the center of the monitor battery. You will hear a small beep. This is turning the monitor battery on. The heart emblem on the monitor battery will light up every 5 seconds if the monitor battery in turned on and connected to the patient securely. Do not push and hold the circle down as this turns the monitor battery off. The cell phone will locate the monitor battery. A screen will appear on the cell phone checking the connection of your monitor strip. This may read poor connection initially but change to good connection within the next minute. Once your monitor accepts the connection you will hear a series of 3 beeps followed by a climbing crescendo of beeps. A screen will appear on the cell phone showing the two monitor strip placement options. Touch the picture that demonstrates where you applied the monitor strip.  Your monitor strip and battery are waterproof. You are able to shower, bathe, or swim with the monitor on. They just ask you do not submerge deeper than 3 feet underwater. We recommend removing the monitor if you are swimming in a lake, river, or ocean.  Your monitor battery will need to be switched to a fully charged monitor battery approximately once a week. The cell phone will alert you of an action which needs to be made.  On the cell phone, tap for details to reveal connection status, monitor battery status, and cell phone battery status. The green dots indicates your monitor is in good status. A red dot indicates  there is something that needs your attention.  To record a symptom, click the circle on the monitor battery. In 30-60 seconds a list of symptoms will appear on the cell phone. Select your symptom and tap save. Your monitor will record a sustained or significant arrhythmia regardless of you clicking the button. Some patients do not feel the heart rhythm irregularities. [employer] will notify us of any serious or critical events.  Refer to instruction booklet for instructions on switching batteries, changing strips, the Do not disturb or Pause features, or any additional questions.  Call [employer] at [phone], to confirm your monitor is transmitting and record your baseline. They will answer any questions you may have regarding the monitor instructions at that time.  Returning the monitor to [place] all equipment back into blue box. Peel off strip of paper to expose adhesive and close box securely. There is a prepaid UPS shipping label on this box. Drop in a UPS drop box, or at a UPS facility like [employer]. You may also contact [employer] to arrange UPS to pick up monitor package at your home.

## 2021-11-25 DIAGNOSIS — R002 Palpitations: Secondary | ICD-10-CM | POA: Diagnosis not present

## 2021-11-26 LAB — CBC WITH DIFFERENTIAL/PLATELET
Basophils Absolute: 0.1 10*3/uL (ref 0.0–0.2)
Basos: 1 %
EOS (ABSOLUTE): 0.1 10*3/uL (ref 0.0–0.4)
Eos: 2 %
Hematocrit: 39.3 % (ref 34.0–46.6)
Hemoglobin: 13.1 g/dL (ref 11.1–15.9)
Immature Grans (Abs): 0 10*3/uL (ref 0.0–0.1)
Immature Granulocytes: 0 %
Lymphocytes Absolute: 2 10*3/uL (ref 0.7–3.1)
Lymphs: 37 %
MCH: 32 pg (ref 26.6–33.0)
MCHC: 33.3 g/dL (ref 31.5–35.7)
MCV: 96 fL (ref 79–97)
Monocytes Absolute: 0.8 10*3/uL (ref 0.1–0.9)
Monocytes: 15 %
Neutrophils Absolute: 2.4 10*3/uL (ref 1.4–7.0)
Neutrophils: 45 %
Platelets: 261 10*3/uL (ref 150–450)
RBC: 4.1 x10E6/uL (ref 3.77–5.28)
RDW: 12.2 % (ref 11.7–15.4)
WBC: 5.3 10*3/uL (ref 3.4–10.8)

## 2021-11-26 LAB — FERRITIN: Ferritin: 24 ng/mL (ref 15–150)

## 2021-11-27 DIAGNOSIS — R293 Abnormal posture: Secondary | ICD-10-CM | POA: Diagnosis not present

## 2021-11-27 DIAGNOSIS — M542 Cervicalgia: Secondary | ICD-10-CM | POA: Diagnosis not present

## 2021-11-27 DIAGNOSIS — M25551 Pain in right hip: Secondary | ICD-10-CM | POA: Diagnosis not present

## 2021-11-30 ENCOUNTER — Other Ambulatory Visit (HOSPITAL_BASED_OUTPATIENT_CLINIC_OR_DEPARTMENT_OTHER): Payer: Self-pay

## 2021-12-01 ENCOUNTER — Other Ambulatory Visit (HOSPITAL_BASED_OUTPATIENT_CLINIC_OR_DEPARTMENT_OTHER): Payer: Self-pay

## 2021-12-01 MED ORDER — LEVOTHYROXINE SODIUM 112 MCG PO TABS
112.0000 ug | ORAL_TABLET | Freq: Every morning | ORAL | 0 refills | Status: DC
  Filled 2021-12-01: qty 36, 84d supply, fill #0

## 2021-12-03 ENCOUNTER — Ambulatory Visit: Payer: 59 | Attending: Cardiovascular Disease

## 2021-12-03 DIAGNOSIS — R002 Palpitations: Secondary | ICD-10-CM

## 2021-12-08 ENCOUNTER — Other Ambulatory Visit (HOSPITAL_BASED_OUTPATIENT_CLINIC_OR_DEPARTMENT_OTHER): Payer: Self-pay

## 2021-12-08 MED ORDER — LEVOTHYROXINE SODIUM 125 MCG PO TABS
125.0000 ug | ORAL_TABLET | Freq: Every morning | ORAL | 3 refills | Status: DC
  Filled 2022-02-17 – 2022-03-02 (×2): qty 48, 84d supply, fill #0
  Filled 2022-05-16 (×2): qty 48, 84d supply, fill #1

## 2021-12-08 MED ORDER — LEVOTHYROXINE SODIUM 125 MCG PO TABS
125.0000 ug | ORAL_TABLET | ORAL | 3 refills | Status: DC
  Filled 2021-12-08: qty 48, 84d supply, fill #0

## 2021-12-09 ENCOUNTER — Other Ambulatory Visit (HOSPITAL_BASED_OUTPATIENT_CLINIC_OR_DEPARTMENT_OTHER): Payer: Self-pay

## 2021-12-09 DIAGNOSIS — M25551 Pain in right hip: Secondary | ICD-10-CM | POA: Diagnosis not present

## 2021-12-09 DIAGNOSIS — M542 Cervicalgia: Secondary | ICD-10-CM | POA: Diagnosis not present

## 2021-12-09 DIAGNOSIS — R293 Abnormal posture: Secondary | ICD-10-CM | POA: Diagnosis not present

## 2021-12-10 ENCOUNTER — Other Ambulatory Visit (HOSPITAL_BASED_OUTPATIENT_CLINIC_OR_DEPARTMENT_OTHER): Payer: Self-pay

## 2021-12-17 ENCOUNTER — Ambulatory Visit: Payer: 59 | Admitting: Physical Medicine and Rehabilitation

## 2021-12-17 ENCOUNTER — Ambulatory Visit: Payer: Self-pay

## 2021-12-17 DIAGNOSIS — M461 Sacroiliitis, not elsewhere classified: Secondary | ICD-10-CM | POA: Diagnosis not present

## 2021-12-17 NOTE — Progress Notes (Signed)
Marilyn Collins - 66 y.o. female MRN [id number]  Date of birth: Mar 05, 1955  Office Visit Note: Visit Date: 12/17/2021 PCP: [facility] [place] [place] Referred by: Lorine Bears, NP  Subjective: No chief complaint on file.  HPI:  Marilyn Collins is a 66 y.o. female who comes in todayHPI ROS Otherwise per HPI.  Assessment & Plan: Visit Diagnoses: No diagnosis found.  Plan: No additional findings.   Meds & Orders: No orders of the defined types were placed in this encounter.  No orders of the defined types were placed in this encounter.   Follow-up: No follow-ups on file.   Procedures: Sacroiliac Joint Inj on 12/17/2021 3:34 PM Indications: pain and diagnostic evaluation Details: 22 G 3.5 in needle, fluoroscopy-guided posterior approach Medications (Right): 40 mg triamcinolone acetonide 40 MG/ML; 2 mL bupivacaine 0.5 % Medications (Left): 40 mg triamcinolone acetonide 40 MG/ML; 2 mL bupivacaine 0.5 % Outcome: tolerated well, no immediate complications  Sacroiliac Joint Intra-Articular Injection - Posterior Approach with Fluoroscopic Guidance   Position: PRONE  Additional Comments: Vital signs were monitored before and after the procedure. Patient was prepped and draped in the usual sterile fashion. The correct patient, procedure, and site was verified.   Injection Procedure Details:   Location/Site:  Sacroiliac joint  Needle size: 3.5 in Spinal Needle  Needle type: Spinal  Needle Placement: Intra-articular  Findings:  -Comments: There was excellent flow of contrast producing a partial arthrogram of the sacroiliac joint.   Procedure Details: Starting with a 90 degree vertical and midline orientation the fluoroscope was tilted cranially 20 to 25 degrees and the target area of the inferior most part of the SI joint on the side mentioned above was visualized.  The soft tissues overlying this target were infiltrated with 4 ml. of 1% Lidocaine without Epinephrine.  A #22 gauge spinal needle was inserted perpendicular to the fluoroscope table and advanced into the posterior inferior joint space using fluoroscopic guidance.  Position in the joint space was confirmed by obtaining a partial arthrogram using a 2 ml. volume of Isovue-250 contrast agent. After negative aspirate for gross pus or blood, the injectate was delivered to the joint. Radiographs were obtained for documentation purposes.   Additional Comments:   Dressing: Bandaid    Post-procedure details: Patient was observed during the procedure. Post-procedure instructions were reviewed.  Patient left the clinic in stable condition.    There was excellent flow of contrast producing a partial arthrogram of the sacroiliac joint.  Procedure, treatment alternatives, risks and benefits explained, specific risks discussed. Consent was given by the patient. Immediately prior to procedure a time out was called to verify the correct patient, procedure, equipment, support staff and site/side marked as required. Patient was prepped and draped in the usual sterile fashion.         Clinical History: XR Pelvic 1-2 Views 11/19/2020  No SI joint sclerosis or narrowing was noted.  Some osteoarthritic changes  were noted in the SI joints.  No hip joint narrowing was noted.   Impression: These findings are consistent with mild osteoarthritic changes  of the SI joints. -------------------------------- MRI LUMBAR SPINE WITHOUT CONTRAST  02/17/2011  Technique:  Multiplanar and multiecho pulse sequences of the lumbar  spine were obtained without intravenous contrast.   Comparison: Lumbar MRI 03/06/2004.   Findings: Five lumbar type vertebral bodies are assumed.  The  alignment is stable.  Scattered hemangiomas are stable, most  prominent at T11.  Compared with the prior  examinations, the  patient has developed diffusely decreased marrow signal throughout  the spine. There are no new focal marrow  lesions.  In addition, the  liver demonstrates diffusely decreased T2 signal.  The spleen is  not imaged.   The conus medullaris extends to the L1-L2 level and appears normal.  Bilateral sacral Tarlov cysts are stable.  There are no paraspinal  abnormalities.   Based on the sagittal images, mild disc bulging at T10-T11 appears  stable.   There are no significant disc space findings from T11-T12 through  L2-L3.   L3-L4:  Disc height and hydration are maintained.  There is mild  bilateral facet hypertrophy without resulting spinal stenosis or  nerve root encroachment.   L4-L5:  Disc desiccation and a small posterolateral disc protrusion  on the right are chronic and unchanged.  There is mild bilateral  facet hypertrophy.  No foraminal compromise or nerve root  encroachment is present.   L5-S1:  Disc height and hydration are maintained.  There is mild  bilateral facet hypertrophy without resulting foraminal compromise  or nerve root encroachment.   IMPRESSION:   1.  Stable small posterolateral disc protrusion on the right at L4-  L5.  No resulting spinal stenosis or nerve root encroachment.  2.  Mild facet disease inferiorly.  3.  No acute disc space findings, significant spinal stenosis or  nerve root encroachment.  4.  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 Correlate clinically.     Objective:  VS:  HT:    WT:   BMI:     BP:   HR: bpm  TEMP: ( )  RESP:  Physical Exam   Imaging: No results found.

## 2021-12-18 MED ORDER — BUPIVACAINE HCL 0.5 % IJ SOLN
2.0000 mL | INTRAMUSCULAR | Status: AC | PRN
  Administered 2021-12-17: 2 mL via INTRA_ARTICULAR

## 2021-12-18 MED ORDER — TRIAMCINOLONE ACETONIDE 40 MG/ML IJ SUSP
40.0000 mg | INTRAMUSCULAR | Status: AC | PRN
  Administered 2021-12-17: 40 mg via INTRA_ARTICULAR

## 2021-12-23 DIAGNOSIS — R293 Abnormal posture: Secondary | ICD-10-CM | POA: Diagnosis not present

## 2021-12-23 DIAGNOSIS — M542 Cervicalgia: Secondary | ICD-10-CM | POA: Diagnosis not present

## 2021-12-23 DIAGNOSIS — M25551 Pain in right hip: Secondary | ICD-10-CM | POA: Diagnosis not present

## 2021-12-29 ENCOUNTER — Other Ambulatory Visit (HOSPITAL_BASED_OUTPATIENT_CLINIC_OR_DEPARTMENT_OTHER): Payer: Self-pay

## 2022-01-01 ENCOUNTER — Other Ambulatory Visit (HOSPITAL_BASED_OUTPATIENT_CLINIC_OR_DEPARTMENT_OTHER): Payer: Self-pay

## 2022-01-06 ENCOUNTER — Encounter: Payer: Self-pay | Admitting: Physical Medicine and Rehabilitation

## 2022-01-06 ENCOUNTER — Other Ambulatory Visit: Payer: Self-pay | Admitting: Physical Medicine and Rehabilitation

## 2022-01-06 ENCOUNTER — Other Ambulatory Visit (HOSPITAL_BASED_OUTPATIENT_CLINIC_OR_DEPARTMENT_OTHER): Payer: Self-pay

## 2022-01-06 DIAGNOSIS — M542 Cervicalgia: Secondary | ICD-10-CM | POA: Diagnosis not present

## 2022-01-06 DIAGNOSIS — R293 Abnormal posture: Secondary | ICD-10-CM | POA: Diagnosis not present

## 2022-01-06 DIAGNOSIS — M25551 Pain in right hip: Secondary | ICD-10-CM | POA: Diagnosis not present

## 2022-01-06 MED ORDER — BACLOFEN 10 MG PO TABS
ORAL_TABLET | ORAL | 0 refills | Status: AC
  Filled 2022-01-06: qty 30, 30d supply, fill #0

## 2022-01-12 DIAGNOSIS — D1801 Hemangioma of skin and subcutaneous tissue: Secondary | ICD-10-CM | POA: Diagnosis not present

## 2022-01-12 DIAGNOSIS — D225 Melanocytic nevi of trunk: Secondary | ICD-10-CM | POA: Diagnosis not present

## 2022-01-12 DIAGNOSIS — L821 Other seborrheic keratosis: Secondary | ICD-10-CM | POA: Diagnosis not present

## 2022-01-12 DIAGNOSIS — L280 Lichen simplex chronicus: Secondary | ICD-10-CM | POA: Diagnosis not present

## 2022-01-12 DIAGNOSIS — Z85828 Personal history of other malignant neoplasm of skin: Secondary | ICD-10-CM | POA: Diagnosis not present

## 2022-01-12 DIAGNOSIS — D2372 Other benign neoplasm of skin of left lower limb, including hip: Secondary | ICD-10-CM | POA: Diagnosis not present

## 2022-01-14 ENCOUNTER — Encounter: Payer: Self-pay | Admitting: Physical Medicine and Rehabilitation

## 2022-01-14 ENCOUNTER — Other Ambulatory Visit: Payer: Self-pay | Admitting: Physical Medicine and Rehabilitation

## 2022-01-14 ENCOUNTER — Ambulatory Visit: Payer: 59 | Admitting: Physical Medicine and Rehabilitation

## 2022-01-14 VITALS — BP 124/79 | HR 64

## 2022-01-14 DIAGNOSIS — M7918 Myalgia, other site: Secondary | ICD-10-CM

## 2022-01-14 DIAGNOSIS — M549 Dorsalgia, unspecified: Secondary | ICD-10-CM

## 2022-01-14 DIAGNOSIS — M542 Cervicalgia: Secondary | ICD-10-CM

## 2022-01-14 NOTE — Progress Notes (Signed)
Marilyn Collins - 66 y.o. female MRN [id number]  Date of birth: 06-21-1955  Office Visit Note: Visit Date: 01/14/2022 PCP: [facility] [place] Referred by: [facility]  Subjective: Chief Complaint  Patient presents with   Neck - Pain   Left Shoulder - Pain   HPI: Marilyn Collins is a 66 y.o. female who comes in today for evaluation of chronic, worsening and severe pain to left neck and left upper back. Pain ongoing for several years and worsens with movement and activity. She describes pain as sore, aching and tight, currently rates as 8 out of 10. Some relief of pain with home exercise regimen, rest and use of medications.  She continues with Baclofen and Gabapentin.  Patient is currently being treated at [facility], some relief of pain with massage therapy and dry needling. Patient has long history of myofascial pain. Historically, she has done with well with intermittent myofascial trigger point injections in our office, most recent performed on 09/10/2021, she reports greater than 2 months relief of pain with this procedure.  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   Of note, she does report good relief of pain with recent bilateral sacroiliac joint injections on 12/17/2021.  States pain to bilateral buttocks has improved greatly.  Patients course is complicated by being severely underweight with celiac disease, anxiety and peripheral polyneuropathy.      Review of Systems  Musculoskeletal:  Positive for myalgias and neck pain.  Neurological:  Negative for tingling, sensory change, focal weakness and weakness.  All other systems reviewed and are negative.  Otherwise per HPI.  Assessment & Plan: Visit Diagnoses:    ICD-10-CM   1. Neck pain  M54.2     2. Upper back pain  M54.9     3. Myofascial pain syndrome  M79.18        Plan: Findings:  Chronic, worsening and severe left-sided neck and  left upper back pain.  Patient continues to have severe pain despite good conservative therapy such as massage therapy, dry needling, rest and use of medications.  Patient's clinical presentation and exam are consistent with myofascial pain syndrome.  She does have multiple palpable trigger points upon exam today. Good relief of pain with previous myofascial trigger point injections.  We did perform myofascial trigger point injections to left sternocleidomastoid, trapezius and levator scapula muscles.  Patient tolerated procedure without difficulty.  If good relief of pain we can repeat this procedure infrequently as needed.  Patient encouraged to continue with treatments at [facility].  I encouraged her to use ice pack for the next several days if she experiences swelling or pain at injection sites.  Patient encouraged to follow-up with [place] as needed.      Meds & Orders: No orders of the defined types were placed in this encounter.   Orders Placed This Encounter  Procedures   Trigger Point Inj    Follow-up: Return if symptoms worsen or fail to improve.   Procedures: Trigger Point Inj  Date/Time: 01/14/2022 4:26 PM  Performed by: Lorine Bears, NP Authorized by: Lorine Bears, NP   Consent Given by:  Patient Site marked: the procedure site was marked   Timeout: prior to procedure the correct patient, procedure, and site was verified   Indications:  Muscle spasm and pain Total # of Trigger Points:  3 or more Location: neck and back  Needle Size:  22 G Medications #1:  5 mL lidocaine 1 %; 40 mg triamcinolone acetonide 40 MG/ML; 5 mL lidocaine (PF) 1 % Patient tolerance:  Patient tolerated the procedure well with no immediate complications Comments: Palpable trigger points noted to left sternocleidomastoid, trapezius, levator scapulae muscles. Needling technique utilized.        Clinical History: XR Pelvic 1-2 Views 11/19/2020  No SI joint sclerosis or narrowing  was noted.  Some osteoarthritic changes  were noted in the SI joints.  No hip joint narrowing was noted.   Impression: These findings are consistent with mild osteoarthritic changes  of the SI joints. -------------------------------- MRI LUMBAR SPINE WITHOUT CONTRAST  02/17/2011  Technique:  Multiplanar and multiecho pulse sequences of the lumbar  spine were obtained without intravenous contrast.   Comparison: Lumbar MRI 03/06/2004.   Findings: Five lumbar type vertebral bodies are assumed.  The  alignment is stable.  Scattered hemangiomas are stable, most  prominent at T11.  Compared with the prior examinations, the  patient has developed diffusely decreased marrow signal throughout  the spine. There are no new focal marrow lesions.  In addition, the  liver demonstrates diffusely decreased T2 signal.  The spleen is  not imaged.   The conus medullaris extends to the L1-L2 level and appears normal.  Bilateral sacral Tarlov cysts are stable.  There are no paraspinal  abnormalities.   Based on the sagittal images, mild disc bulging at T10-T11 appears  stable.   There are no significant disc space findings from T11-T12 through  L2-L3.   L3-L4:  Disc height and hydration are maintained.  There is mild  bilateral facet hypertrophy without resulting spinal stenosis or  nerve root encroachment.   L4-L5:  Disc desiccation and a small posterolateral disc protrusion  on the right are chronic and unchanged.  There is mild bilateral  facet hypertrophy.  No foraminal compromise or nerve root  encroachment is present.   L5-S1:  Disc height and hydration are maintained.  There is mild  bilateral facet hypertrophy without resulting foraminal compromise  or nerve root encroachment.   IMPRESSION:   1.  Stable small posterolateral disc protrusion on the right at L4-  L5.  No resulting spinal stenosis or nerve root encroachment.  2.  Mild facet disease inferiorly.  3.  No acute disc  space findings, significant spinal stenosis or  nerve root encroachment.  4.  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 Correlate clinically.   She reports that she has never smoked. She has never been exposed to tobacco smoke. She has never used smokeless tobacco. No results for input(s): "HGBA1C", "LABURIC" in the last 8760 hours.  Objective:  VS:  HT:    WT:   BMI:     BP:124/79  HR:64bpm  TEMP: ( )  RESP:  Physical Exam Vitals and nursing note reviewed.  HENT:     Head: Normocephalic and atraumatic.     Right Ear: External ear normal.     Left Ear: External ear normal.     Nose: Nose normal.     Mouth/Throat:     Mouth: Mucous membranes are moist.  Eyes:     Extraocular Movements: Extraocular movements intact.  Cardiovascular:     Rate and Rhythm: Normal rate.     Pulses: Normal pulses.  Pulmonary:     Effort: Pulmonary effort is normal.  Abdominal:     General: Abdomen is flat. There is no distension.  Musculoskeletal:        General: Tenderness present.     Cervical back: Tenderness present.     Comments: Multiple palpate trigger points noted to left sternocleidomastoid, trapezius and levator scapulae muscles.   Skin:    General: Skin is warm and dry.     Capillary Refill: Capillary refill takes less than 2 seconds.  Neurological:     General: No focal deficit present.     Mental Status: She is alert and oriented to person, place, and time.  Psychiatric:        Mood and Affect: Mood normal.        Behavior: Behavior normal.     Ortho Exam  Imaging: No results found.  Past Medical/Family/Surgical/Social History: Medications & Allergies reviewed per EMR, new medications updated. Patient Active Problem List   Diagnosis Date Noted   Palpitations 11/24/2021   Aortic atherosclerosis ([place]) 11/24/2021   DDD (degenerative disc disease), lumbar 11/19/2020   Thoracic disc disease 04/19/2019   Myofascial pain  syndrome 04/19/2019   Idiopathic peripheral neuropathy 02/07/2019   Acquired hypothyroidism 02/03/2018   Hormone replacement therapy (HRT) 02/03/2018   Underweight 02/03/2018   GERD (gastroesophageal reflux disease)    Postural kyphosis of thoracic region 03/16/2017   Neck pain 11/20/2016   Chronic right-sided thoracic back pain 08/03/2016   Celiac disease 07/20/2011   Dietary iron deficiency - malabsorption 07/20/2011   Chronic idiopathic monocytosis 07/20/2011   Past Medical History:  Diagnosis Date   Acquired hypothyroidism 02/03/2018   Anxiety    Aortic atherosclerosis ([place]) 11/24/2021   Arthritis    Celiac disease 07/20/2011   Chronic idiopathic monocytosis 07/20/2011   Dysphagia 12/2012   responsive to dexilant   GERD (gastroesophageal reflux disease)    H/O eating disorder    Hypothyroidism    Iron deficiency anemia 07/20/2011   Iron deficiency anemia, unspecified 07/20/2011   Migraine headache    Other specified intestinal malabsorption 07/20/2011   Palpitations 11/24/2021   Family History  Problem Relation Age of Onset   Cancer Mother    Heart disease Mother    Hyperlipidemia Father    Hypertension Father    Stroke Father    CAD Father    Hypertension Brother    Cancer Maternal Grandmother    Stroke Maternal Grandfather    Heart disease Paternal Grandmother    CAD Paternal Grandfather    Anxiety disorder Son    Bipolar disorder Son    Past Surgical History:  Procedure Laterality Date   ABDOMINAL HYSTERECTOMY     COLONOSCOPY  06/2003   neg   DIAGNOSTIC LAPAROSCOPY     ESOPHAGOGASTRODUODENOSCOPY N/A 10/20/2012   Procedure: ESOPHAGOGASTRODUODENOSCOPY (EGD);  Surgeon: Cleotis Nipper, MD;  Location: [facility];  Service: Endoscopy;  Laterality: N/A;   FLEXIBLE SIGMOIDOSCOPY  2005   SINUS IRRIGATION     TMJ ARTHROPLASTY     Social History   Occupational History   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

## 2022-01-14 NOTE — Progress Notes (Signed)
Numeric Pain Rating Scale and Functional Assessment Average Pain 8   In the last MONTH (on 0-10 scale) has pain interfered with the following?  1. General activity like being  able to carry out your everyday physical activities such as walking, climbing stairs, carrying groceries, or moving a chair?  Rating(8)   Left shoulder and left sided neck pain. Turning head to right and looking up makes pain worse

## 2022-01-15 ENCOUNTER — Encounter: Payer: Self-pay | Admitting: Oncology

## 2022-01-16 MED ORDER — TRIAMCINOLONE ACETONIDE 40 MG/ML IJ SUSP
40.0000 mg | INTRAMUSCULAR | Status: AC | PRN
  Administered 2022-01-14: 40 mg via INTRAMUSCULAR

## 2022-01-16 MED ORDER — LIDOCAINE HCL (PF) 1 % IJ SOLN
5.0000 mL | INTRAMUSCULAR | Status: AC | PRN
  Administered 2022-01-14: 5 mL

## 2022-01-16 MED ORDER — LIDOCAINE HCL 1 % IJ SOLN
5.0000 mL | INTRAMUSCULAR | Status: AC | PRN
  Administered 2022-01-14: 5 mL

## 2022-01-17 ENCOUNTER — Encounter: Payer: Self-pay | Admitting: Oncology

## 2022-01-19 ENCOUNTER — Other Ambulatory Visit (HOSPITAL_BASED_OUTPATIENT_CLINIC_OR_DEPARTMENT_OTHER): Payer: Self-pay

## 2022-01-19 ENCOUNTER — Encounter (HOSPITAL_BASED_OUTPATIENT_CLINIC_OR_DEPARTMENT_OTHER): Payer: Self-pay | Admitting: Family

## 2022-01-19 ENCOUNTER — Ambulatory Visit (HOSPITAL_BASED_OUTPATIENT_CLINIC_OR_DEPARTMENT_OTHER): Payer: 59 | Admitting: Family

## 2022-01-19 VITALS — BP 110/74 | HR 67 | Ht 67.0 in | Wt 95.0 lb

## 2022-01-19 DIAGNOSIS — R002 Palpitations: Secondary | ICD-10-CM

## 2022-01-19 DIAGNOSIS — E785 Hyperlipidemia, unspecified: Secondary | ICD-10-CM

## 2022-01-19 DIAGNOSIS — I700 Atherosclerosis of aorta: Secondary | ICD-10-CM

## 2022-01-19 MED ORDER — ROSUVASTATIN CALCIUM 10 MG PO TABS
10.0000 mg | ORAL_TABLET | Freq: Every day | ORAL | 3 refills | Status: DC
  Filled 2022-01-19: qty 90, 90d supply, fill #0

## 2022-01-19 NOTE — Progress Notes (Unsigned)
Office Visit    Patient Name: Marilyn Collins Date of Encounter: 01/19/2022  PCP:  [facility]  Cardiologist:  Skeet Latch, MD  Advanced Practice Provider:  No care team member to display Electrophysiologist:  None      Chief Complaint    Marilyn Collins is a 66 y.o. female presents today for follow-up after heart monitor  Past Medical History    Past Medical History:  Diagnosis Date   Acquired hypothyroidism 02/03/2018   Anxiety    Aortic atherosclerosis ([place]) 11/24/2021   Arthritis    Celiac disease 07/20/2011   Chronic idiopathic monocytosis 07/20/2011   Dysphagia 12/2012   responsive to dexilant   GERD (gastroesophageal reflux disease)    H/O eating disorder    Hypothyroidism    Iron deficiency anemia 07/20/2011   Iron deficiency anemia, unspecified 07/20/2011   Migraine headache    Other specified intestinal malabsorption 07/20/2011   Palpitations 11/24/2021   Past Surgical History:  Procedure Laterality Date   ABDOMINAL HYSTERECTOMY     COLONOSCOPY  06/2003   neg   DIAGNOSTIC LAPAROSCOPY     ESOPHAGOGASTRODUODENOSCOPY N/A 10/20/2012   Procedure: ESOPHAGOGASTRODUODENOSCOPY (EGD);  Surgeon: Cleotis Nipper, MD;  Location: [facility];  Service: Endoscopy;  Laterality: N/A;   FLEXIBLE SIGMOIDOSCOPY  2005   SINUS IRRIGATION     TMJ ARTHROPLASTY      Allergies  Allergies  Allergen Reactions   Morphine And Related Nausea And Vomiting   Venofer [Ferric Oxide] Anaphylaxis    History of Present Illness    Marilyn Collins is a 66 y.o. female with a hx of palpitations, aortic atherosclerosis, hyperlipidemia, celiac disease, gastroparesis last seen 11/24/2021.  Her father had heart disease and passed of CVA.  Her paternal grandfather had heart disease.  Her mother had atrial fibrillation.  Seen 10/23 by Dr. Oval Linsey to reestablish care.  Previously saw Dr. Stanford Breed in 2019 for atypical chest pain referred  for treadmill stress test but was not performed.  At her 10/23 visit noted increasing palpitations over the last 6 months.  Due to palpitations with episodes of prolonged palpitations 30-day monitor was ordered.  CBC, ferritin, electrolytes, thyroid function unremarkable. She was considering whether she wanted to continue dietary changes versus start statin.  Monitor preliminary report with predominantly normal sinus rhythm.  PVC and PAC burden less than 1%.  No atrial fibrillation.  Her average heart was 69 bpm.  No significant pauses.  Triggered events associated mostly with normal sinus rhythm.  Isolated episode associated with atrial run.  She presents today for follow up. We reviewed preliminary report of monitor. Noting palpitations once per week. Notes palpitations are triggered by fatigue if she works nights and doesn't get much sleep. Notes that her feet feel very cold in the evening from her shins to her feet. Notes some toes on her bilateral feet are numb. She does take Gabapentin and unsure if this is helping much. No pain with ambulation and palpable pulses, discussed low suspicion of claudication.   EKGs/Labs/Other Studies Reviewed:   The following studies were reviewed today:   EKG:  EKG is not ordered today.    Recent Labs: 11/25/2021: Hemoglobin 13.1; Platelets 261  Recent Lipid Panel    Component Value Date/Time   CHOL 188 02/03/2018 1356   TRIG 51.0 02/03/2018 1356   HDL 76.50 02/03/2018 1356   CHOLHDL 2 02/03/2018 1356   VLDL 10.2  02/03/2018 1356   LDLCALC 101 (H) 02/03/2018 1356    Home Medications   Current Meds  Medication Sig   Acetaminophen (TYLENOL ARTHRITIS PAIN PO) Take by mouth at bedtime.   baclofen (LIORESAL) 10 MG tablet Take 0.5 tablet (5 mg dose) by mouth twice daily as needed.   bifidobacterium infantis (ALIGN) capsule Take 1 capsule by mouth daily.   CEQUA 0.09 % SOLN 1 drop BID OU   clonazePAM (KLONOPIN) 0.5 MG tablet 1/2 to 1 tablet Orally  Twice a day as needed 30 days (Patient taking differently: 1/4 tab daily at bedtime)   diclofenac Sodium (VOLTAREN) 1 % GEL Apply topically as needed.   estradiol (VIVELLE-DOT) 0.05 MG/24HR patch Place 1 patch (0.05 mg total) onto the skin 2 (two) times a week. (Patient taking differently: Place 0.5 patches onto the skin 2 (two) times a week.)   gabapentin (NEURONTIN) 100 MG capsule Take 1 capsule (100 mg total) by mouth at bedtime.   influenza vac split quadrivalent PF (FLUARIX) 0.5 ML injection Inject into the muscle.   levothyroxine (SYNTHROID) 112 MCG tablet Take 1 tablet (112 mcg total) by mouth in the morning on an empty stomach for 3 days each week.   levothyroxine (SYNTHROID) 125 MCG tablet Take 1 tablet (125 mcg total) by mouth in the morning on an empty stomach 4 days a week.   Methylcellulose, Laxative, (CITRUCEL PO) Take 1 scoop by mouth daily.   Multiple Vitamin (MULTIVITAMIN PO) Take by mouth daily.   omeprazole (PRILOSEC) 40 MG capsule Take 1 capsule by mouth daily 30 minutes before morning meal   SUMAtriptan (IMITREX) 50 MG tablet TAKE 1 TABLET BY MOUTH AT ONSET OF MIGRAINE AS NEEDED, MAY REPEAT IN 2 HOURS IF HEADACHE PERSISTS OR RECURS 90 days     Review of Systems      All other systems reviewed and are otherwise negative except as noted above.  Physical Exam    VS:  BP 110/74   Pulse 67   Ht 5' 7"  (1.702 m)   Wt 95 lb (43.1 kg)   BMI 14.88 kg/m  , BMI Body mass index is 14.88 kg/m.  Wt Readings from Last 3 Encounters:  01/19/22 95 lb (43.1 kg)  11/24/21 95 lb 3.2 oz (43.2 kg)  09/29/21 95 lb 3.2 oz (43.2 kg)     GEN: Well nourished, well developed, in no acute distress. HEENT: normal. Neck: Supple, no JVD, carotid bruits, or masses. Cardiac: RRR, no murmurs, rubs, or gallops. No clubbing, cyanosis, edema.  Radials/PT 2+ and equal bilaterally.  Respiratory:  Respirations regular and unlabored, clear to auscultation bilaterally. GI: Soft, nontender,  nondistended. MS: No deformity or atrophy. Skin: Warm and dry, no rash. Neuro:  Strength and sensation are intact. Psych: Normal affect.  Assessment & Plan    Palpitations- Monitor with one brief run of atrial tachycardia. Given symptoms are overall infrequent no indication for beta blocker therapy. Encouraged to avoid caffeine, stay well hydrated, manage stress well.   Aortic atherosclerosis - Stable with no anginal symptoms. No indication for ischemic evaluation.  GDMT includes Rosuvastatin. Heart healthy diet and regular cardiovascular exercise encouraged.    HLD, LDL goal less than 70- Most recent LDL not at goal. Start Crestor 79m daily. FLP/LFT in 2-3 months.         Disposition: Follow up in 6 month(s) with TSkeet Latch MD or APP.  Signed, CLoel Dubonnet NP 01/19/2022, 3:23 PM [facility]

## 2022-01-19 NOTE — Patient Instructions (Addendum)
Medication Instructions:  Your physician has recommended you make the following change in your medication:   START Rosuvastatin 61m  May start by taking 17mthree times per week then increase to daily after 2 weeks  Due to your aortic atherosclerosis on CT scans it is recommended for your LDL (bad cholesterol) to be less than 70. It was 131 when checked in October 2023.   *If you need a refill on your cardiac medications before your next appointment, please call your pharmacy*   Lab Work: Your physician recommends that you return for lab work in 2-3 months for fasting lipid panel and CMP  Please return for Lab work. You may come to the...   [facility] (3rd floor) [address] [id number]: 8am-Noon and 1pm-4:30pm  Please ring the doorbell on the small table when you exit the elevator and the Lab Tech will come get you  [facility] [id number]: 8am-1pm, then 2pm-4:30pm   [place]Please see attached locations sheet stapled to your lab work with address and hours.    If you have labs (blood work) drawn today and your tests are completely normal, you will receive your results only by: MyFairviewif you have MyChart) OR A paper copy in the mail If you have any lab test that is abnormal or we need to change your treatment, we will call you to review the results.   Testing/Procedures: Your monitor showed mostly normal sinus rhythm which is a good result! It did show one short run of atrial tachycardia. Dr. RaOval Linseyill also review in detail and we will let you know if there are any changes she recommended.   Follow-Up: [facility] and your health needs are our priority.  As part of our continuing mission to provide you with exceptional heart care, we have created designated Provider Care Teams.  These Care Teams include your primary  Cardiologist (physician) and Advanced Practice Providers (APPs -  Physician Assistants and Nurse Practitioners) who all work together to provide you with the care you need, when you need it.  We recommend signing up for the patient portal called "MyChart".  Sign up information is provided on this After Visit Summary.  MyChart is used to connect with patients for Virtual Visits (Telemedicine).  Patients are able to view lab/test results, encounter notes, upcoming appointments, etc.  Non-urgent messages can be sent to your provider as well.   To learn more about what you can do with MyChart, go to [email or web address]   Your next appointment:   6 month(s)  The format for your next appointment:   In Person  Provider:   TiSkeet LatchMD    Other Instruction  Heart Healthy Diet Recommendations: A low-salt diet is recommended. Meats should be grilled, baked, or boiled. Avoid fried foods. Focus on lean protein sources like fish or chicken with vegetables and fruits. The American Heart Association is a GRMicrobiologist American Heart Association Diet and Lifeystyle Recommendations   Exercise recommendations: The American Heart Association recommends 150 minutes of moderate intensity exercise weekly. Try 30 minutes of moderate intensity exercise 4-5 times per week. This could include walking, jogging, or swimming.  To prevent palpitations: Make sure you are adequately hydrated.  Avoid and/or limit caffeine containing beverages like soda or tea. Exercise regularly.  Manage stress well. Some over the counter medications can cause palpitations  such as Benadryl, AdvilPM, TylenolPM. Regular Advil or Tylenol do not cause palpitations.

## 2022-01-22 DIAGNOSIS — M25551 Pain in right hip: Secondary | ICD-10-CM | POA: Diagnosis not present

## 2022-01-22 DIAGNOSIS — R293 Abnormal posture: Secondary | ICD-10-CM | POA: Diagnosis not present

## 2022-01-22 DIAGNOSIS — M542 Cervicalgia: Secondary | ICD-10-CM | POA: Diagnosis not present

## 2022-01-27 ENCOUNTER — Encounter (HOSPITAL_BASED_OUTPATIENT_CLINIC_OR_DEPARTMENT_OTHER): Payer: Self-pay

## 2022-01-27 ENCOUNTER — Encounter: Payer: Self-pay | Admitting: Rheumatology

## 2022-02-02 NOTE — Telephone Encounter (Signed)
There must be some misunderstanding.  I do not prescribe iron infusions and do not treat iron deficiency anemia.  She might be receiving previous iron infusions from her PCP or gastroenterologist or hematologist.  Please advise her to contact her PCP to get details.

## 2022-02-12 DIAGNOSIS — M25551 Pain in right hip: Secondary | ICD-10-CM | POA: Diagnosis not present

## 2022-02-12 DIAGNOSIS — R293 Abnormal posture: Secondary | ICD-10-CM | POA: Diagnosis not present

## 2022-02-12 DIAGNOSIS — M542 Cervicalgia: Secondary | ICD-10-CM | POA: Diagnosis not present

## 2022-02-17 ENCOUNTER — Encounter (HOSPITAL_BASED_OUTPATIENT_CLINIC_OR_DEPARTMENT_OTHER): Payer: Self-pay | Admitting: Cardiovascular Disease

## 2022-02-17 ENCOUNTER — Other Ambulatory Visit: Payer: Self-pay

## 2022-02-17 ENCOUNTER — Other Ambulatory Visit (HOSPITAL_BASED_OUTPATIENT_CLINIC_OR_DEPARTMENT_OTHER): Payer: Self-pay

## 2022-02-17 ENCOUNTER — Encounter (HOSPITAL_BASED_OUTPATIENT_CLINIC_OR_DEPARTMENT_OTHER): Payer: Self-pay | Admitting: Pharmacist

## 2022-02-17 MED ORDER — LEVOTHYROXINE SODIUM 112 MCG PO TABS
ORAL_TABLET | ORAL | 2 refills | Status: DC
  Filled 2022-02-17: qty 36, 84d supply, fill #0
  Filled 2022-05-16 (×2): qty 36, 84d supply, fill #1

## 2022-02-17 MED ORDER — OMEPRAZOLE 40 MG PO CPDR
DELAYED_RELEASE_CAPSULE | ORAL | 2 refills | Status: DC
  Filled 2022-03-23 (×2): qty 90, 90d supply, fill #0
  Filled 2022-07-04: qty 90, 90d supply, fill #1
  Filled 2022-09-21: qty 90, 90d supply, fill #2

## 2022-02-17 NOTE — Telephone Encounter (Signed)
Please advise for medication change?

## 2022-02-21 ENCOUNTER — Other Ambulatory Visit (HOSPITAL_BASED_OUTPATIENT_CLINIC_OR_DEPARTMENT_OTHER): Payer: Self-pay

## 2022-02-26 DIAGNOSIS — M25551 Pain in right hip: Secondary | ICD-10-CM | POA: Diagnosis not present

## 2022-02-26 DIAGNOSIS — R293 Abnormal posture: Secondary | ICD-10-CM | POA: Diagnosis not present

## 2022-02-26 DIAGNOSIS — M542 Cervicalgia: Secondary | ICD-10-CM | POA: Diagnosis not present

## 2022-03-02 ENCOUNTER — Other Ambulatory Visit (HOSPITAL_BASED_OUTPATIENT_CLINIC_OR_DEPARTMENT_OTHER): Payer: Self-pay

## 2022-03-02 ENCOUNTER — Encounter: Payer: Self-pay | Admitting: Oncology

## 2022-03-03 ENCOUNTER — Other Ambulatory Visit (HOSPITAL_BASED_OUTPATIENT_CLINIC_OR_DEPARTMENT_OTHER): Payer: Self-pay

## 2022-03-03 MED ORDER — PRAVASTATIN SODIUM 20 MG PO TABS
20.0000 mg | ORAL_TABLET | Freq: Every evening | ORAL | 2 refills | Status: DC
  Filled 2022-03-03: qty 30, 30d supply, fill #0

## 2022-03-12 DIAGNOSIS — R293 Abnormal posture: Secondary | ICD-10-CM | POA: Diagnosis not present

## 2022-03-12 DIAGNOSIS — M25551 Pain in right hip: Secondary | ICD-10-CM | POA: Diagnosis not present

## 2022-03-12 DIAGNOSIS — M542 Cervicalgia: Secondary | ICD-10-CM | POA: Diagnosis not present

## 2022-03-23 ENCOUNTER — Other Ambulatory Visit: Payer: Self-pay

## 2022-03-23 ENCOUNTER — Other Ambulatory Visit (HOSPITAL_BASED_OUTPATIENT_CLINIC_OR_DEPARTMENT_OTHER): Payer: Self-pay

## 2022-03-23 NOTE — Progress Notes (Addendum)
Office Visit Note  Patient: Marilyn Collins             Date of Birth: 1955/04/30           MRN: [id number]             PCP: [facility] Referring: [facility]* Visit Date: 04/06/2022 Occupation: @GUAROCC @  Subjective:  Pain in multiple joints and muscles  History of Present Illness: Marilyn Collins is a 67 y.o. female history of sicca symptoms, osteoarthritis, degenerative disc disease and myofascial pain syndrome.  She states she continues to have trapezius muscle spasm and has been going to integrative therapies which has been helpful.  Patient states that she gets to have dry needling and cupping which helps.  She continues to have some spasm in the left trapezius region.  She also had bilateral SI joints injection by Dr. Alvester Morin in October which should relieve the discomfort for some time.  She states the discomfort is coming back now.  She has been having pain and discomfort in her right CMC joint and her left third PIP joint.  She states her left middle finger has been triggering intermittently.  She is active and goes to the aerobics, strength training, Pilates and tai chi.    Activities of Daily Living:  Patient reports morning stiffness for 5 minutes.   Patient Denies nocturnal pain.  Difficulty dressing/grooming: Denies Difficulty climbing stairs: Denies Difficulty getting out of chair: Denies Difficulty using hands for taps, buttons, cutlery, and/or writing: Reports  Review of Systems  Constitutional:  Positive for fatigue.  HENT:  Positive for mouth dryness. Negative for mouth sores.   Eyes:  Positive for dryness.  Respiratory: Negative.  Negative for shortness of breath.   Cardiovascular: Negative.  Negative for chest pain and palpitations.  Gastrointestinal:  Positive for constipation. Negative for blood in stool and diarrhea.  Endocrine: Negative.  Negative for increased urination.  Genitourinary: Negative.  Negative for involuntary  urination.  Musculoskeletal:  Positive for joint pain, joint pain, joint swelling, myalgias, muscle weakness, morning stiffness, muscle tenderness and myalgias. Negative for gait problem.  Skin: Negative.  Negative for color change, rash, hair loss and sensitivity to sunlight.  Allergic/Immunologic: Negative.  Negative for susceptible to infections.  Neurological: Negative.  Negative for dizziness and headaches.  Hematological: Negative.  Negative for swollen glands.  Psychiatric/Behavioral: Negative.  Negative for depressed mood and sleep disturbance. The patient is not nervous/anxious.     PMFS History:  Patient Active Problem List   Diagnosis Date Noted   Palpitations 11/24/2021   Aortic atherosclerosis (HCC) 11/24/2021   DDD (degenerative disc disease), lumbar 11/19/2020   Thoracic disc disease 04/19/2019   Myofascial pain syndrome 04/19/2019   Idiopathic peripheral neuropathy 02/07/2019   Acquired hypothyroidism 02/03/2018   Hormone replacement therapy (HRT) 02/03/2018   Underweight 02/03/2018   GERD (gastroesophageal reflux disease)    Postural kyphosis of thoracic region 03/16/2017   Neck pain 11/20/2016   Chronic right-sided thoracic back pain 08/03/2016   Celiac disease 07/20/2011   Dietary iron deficiency - malabsorption 07/20/2011   Chronic idiopathic monocytosis 07/20/2011    Past Medical History:  Diagnosis Date   Acquired hypothyroidism 02/03/2018   Anxiety    Aortic atherosclerosis (HCC) 11/24/2021   Arthritis    Celiac disease 07/20/2011   Chronic idiopathic monocytosis 07/20/2011   Dysphagia 12/2012   responsive to dexilant   GERD (gastroesophageal reflux disease)    H/O eating  disorder    Hypothyroidism    Iron deficiency anemia 07/20/2011   Iron deficiency anemia, unspecified 07/20/2011   Migraine headache    Other specified intestinal malabsorption 07/20/2011   Palpitations 11/24/2021    Family History  Problem Relation Age of Onset   Cancer Mother     Heart disease Mother    Hyperlipidemia Father    Hypertension Father    Stroke Father    CAD Father    Hypertension Brother    Cancer Maternal Grandmother    Stroke Maternal Grandfather    Heart disease Paternal Grandmother    CAD Paternal Grandfather    Anxiety disorder Son    Bipolar disorder Son    Past Surgical History:  Procedure Laterality Date   ABDOMINAL HYSTERECTOMY     COLONOSCOPY  06/2003   neg   DIAGNOSTIC LAPAROSCOPY     ESOPHAGOGASTRODUODENOSCOPY N/A 10/20/2012   Procedure: ESOPHAGOGASTRODUODENOSCOPY (EGD);  Surgeon: Florencia Reasons, MD;  Location: [facility];  Service: Endoscopy;  Laterality: N/A;   FLEXIBLE SIGMOIDOSCOPY  2005   SINUS IRRIGATION     TMJ ARTHROPLASTY     Social History   Social History Narrative   RN at [employer]   Married   2 boys   Immunization History  Administered Date(s) Administered   Influenza-Unspecified 10/19/2016, 10/21/2018   Pfizer Covid-19 Firefighter Booster 103yrs & up 01/02/2021   Td 02/26/2009   Tdap 10/03/2016   Zoster Recombinat (Shingrix) 02/03/2018, 08/16/2018     Objective: Vital Signs: BP 107/67 (BP Location: Left Arm, Patient Position: Sitting, Cuff Size: Normal)   Pulse 76   Resp 16   Ht 5\' 7"  (1.702 m)   Wt 94 lb 6.4 oz (42.8 kg)   BMI 14.79 kg/m    Physical Exam Vitals and nursing note reviewed.  Constitutional:      Appearance: She is well-developed.  HENT:     Head: Normocephalic and atraumatic.  Eyes:     Conjunctiva/sclera: Conjunctivae normal.  Cardiovascular:     Rate and Rhythm: Normal rate and regular rhythm.     Heart sounds: Normal heart sounds.  Pulmonary:     Effort: Pulmonary effort is normal.     Breath sounds: Normal breath sounds.  Abdominal:     General: Bowel sounds are normal.     Palpations: Abdomen is soft.  Musculoskeletal:     Cervical back: Normal range of motion.  Lymphadenopathy:     Cervical: No cervical adenopathy.  Skin:    General: Skin is warm  and dry.     Capillary Refill: Capillary refill takes less than 2 seconds.  Neurological:     Mental Status: She is alert and oriented to person, place, and time.  Psychiatric:        Behavior: Behavior normal.      Musculoskeletal Exam: She had limited lateral rotation to the left and had left-sided trapezius spasm.  Shoulders, elbows, wrist joints, MCPs PIPs and DIPs were in good range of motion.  She had bilateral CMC thickening and PIP and DIP thickening.  She had discomfort on palpation of the right CMC joint without swelling.  She had tenderness over left third PIP joint.  She had triggering of her left ring finger with flexor tendon thickening.  Hip joints and knee joints were in good range of motion.  She no warmth swelling or effusion was noted.  No tenderness over ankles or MTPs was noted.  CDAI Exam: CDAI Score: -- Patient Global: --;  Provider Global: -- Swollen: --; Tender: -- Joint Exam 04/06/2022   No joint exam has been documented for this visit   There is currently no information documented on the homunculus. Go to the Rheumatology activity and complete the homunculus joint exam.  Investigation: No additional findings.  Imaging: No results found.  Recent Labs: Lab Results  Component Value Date   WBC 5.3 11/25/2021   HGB 13.1 11/25/2021   PLT 261 11/25/2021   NA 138 02/03/2018   K 4.2 02/03/2018   CL 103 02/03/2018   CO2 29 02/03/2018   GLUCOSE 77 02/03/2018   BUN 18 02/03/2018   CREATININE 0.75 02/03/2018   BILITOT 0.5 02/03/2018   ALKPHOS 47 02/03/2018   AST 17 02/03/2018   ALT 16 02/03/2018   PROT 7.0 02/03/2018   ALBUMIN 4.3 02/03/2018   CALCIUM 9.7 02/03/2018    Speciality Comments: No specialty comments available.  Procedures:  No procedures performed Allergies: Morphine and related and Venofer [ferric oxide]   Assessment / Plan:     Visit Diagnoses: Sicca complex (HCC) - SSA negative, SSB negative, RF negative.  She continues to have dry  mouth and dry eyes.  Over-the-counter products were discussed.  She tried pilocarpine in the past but did not like the side effects and discontinued.   Primary osteoarthritis of both hands - Bilateral CMC arthritis.  She continues to have pain and discomfort in the bilateral CMC joints.  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  She is unable to use CMC brace at work.  Her right CMC joint is painful.  She states she had cortisone injection several years ago but it was extremely painful and did not help her for a long time.  Use of Voltaren gel hand CMC brace was advised while she is at home and doing chores.  She continues to have some discomfort in her PIP and DIP joints.  Left third PIP joint has been causing discomfort due to osteoarthritis.  Joint protection muscle strengthening was discussed.  Natural anti-inflammatories were discussed.  Trigger finger, left middle finger -she has been having intermittent triggering.  Flexor tendon thickening was noted.  I offered cortisone injection which she declined.  Use of Voltaren gel and splinting of the finger was advised if needed.  Patient stated that she will contact us if she needs cortisone injection in the future.  Chronic right SI joint pain - Mild osteoarthritic changes were noted on the x-rays in October 2022.  She goes to integrative therapies.  She had bilateral SI joint cortisone injections by Dr. Alvester Morin on December 17, 2021.  She stated helped her for some time but now the pain in the right SI joint is coming back.  Stretching exercises were advised.  Primary osteoarthritis of both feet-she has osteoarthritis in her bilateral feet.  Proper fitting shoes were advised.  DDD (degenerative disc disease), cervical -she continues to have stiffness in her cervical spine.  She had limited lateral rotation on the examination today.  A handout on stretching exercises was given.  She had left trapezius spasm related to disc disease and  myofascial pain.  Patient states she has been going to integrative therapies.  She has been getting cupping and dry needling which has been helpful.  I discussed the option of trigger point injection in the future.  Postural kyphosis of thoracic region  DDD (degenerative disc disease), thoracic-she had good mobility without discomfort.  DDD (degenerative disc disease),  lumbar-no point tenderness was noted.  Myofascial pain syndrome -she continues to have generalized pain and tender points.  She is on baclofen 5 mg p.o. twice daily she as needed and gabapentin 200 p.o. nightly.  She goes to integrative therapies.  She has been also exercising on a regular basis.  She works night shifts as a Engineer, civil (consulting) and still goes for aerobics, strength training, Pilates and tai chi.  Osteopenia of multiple sites - October 13, 2021 the BMD measured at DualFemur Neck Right is 0.779 g/cm2 with aT-score of -1.9.  DEXA scan results were discussed.  Use of calcium rich diet and vitamin D was discussed.  She is already exercising.  Other medical problems are listed as follows:  Celiac disease  History of gastroesophageal reflux (GERD)  Dietary iron deficiency - malabsorption  Idiopathic peripheral neuropathy  Acquired hypothyroidism  Chronic idiopathic monocytosis  Family history of rheumatoid arthritis  Orders: No orders of the defined types were placed in this encounter.  No orders of the defined types were placed in this encounter.    Follow-Up Instructions: Return in about 6 months (around 10/07/2022) for sicca, OA, MFPS.   Pollyann Savoy, MD  Note - This record has been created using Animal nutritionist.  Chart creation errors have been sought, but may not always  have been located. Such creation errors do not reflect on  the standard of medical care.

## 2022-03-24 ENCOUNTER — Other Ambulatory Visit (HOSPITAL_BASED_OUTPATIENT_CLINIC_OR_DEPARTMENT_OTHER): Payer: Self-pay

## 2022-03-24 DIAGNOSIS — M542 Cervicalgia: Secondary | ICD-10-CM | POA: Diagnosis not present

## 2022-03-24 DIAGNOSIS — M25551 Pain in right hip: Secondary | ICD-10-CM | POA: Diagnosis not present

## 2022-03-24 DIAGNOSIS — R293 Abnormal posture: Secondary | ICD-10-CM | POA: Diagnosis not present

## 2022-04-06 ENCOUNTER — Other Ambulatory Visit (HOSPITAL_BASED_OUTPATIENT_CLINIC_OR_DEPARTMENT_OTHER): Payer: Self-pay

## 2022-04-06 ENCOUNTER — Encounter: Payer: Self-pay | Admitting: Rheumatology

## 2022-04-06 ENCOUNTER — Ambulatory Visit: Payer: Commercial Managed Care - PPO | Attending: Rheumatology | Admitting: Rheumatology

## 2022-04-06 ENCOUNTER — Encounter (HOSPITAL_BASED_OUTPATIENT_CLINIC_OR_DEPARTMENT_OTHER): Payer: Self-pay | Admitting: Cardiovascular Disease

## 2022-04-06 VITALS — BP 107/67 | HR 76 | Resp 16 | Ht 67.0 in | Wt 94.4 lb

## 2022-04-06 DIAGNOSIS — M19041 Primary osteoarthritis, right hand: Secondary | ICD-10-CM | POA: Diagnosis not present

## 2022-04-06 DIAGNOSIS — M5134 Other intervertebral disc degeneration, thoracic region: Secondary | ICD-10-CM | POA: Diagnosis not present

## 2022-04-06 DIAGNOSIS — M7918 Myalgia, other site: Secondary | ICD-10-CM

## 2022-04-06 DIAGNOSIS — K900 Celiac disease: Secondary | ICD-10-CM

## 2022-04-06 DIAGNOSIS — M533 Sacrococcygeal disorders, not elsewhere classified: Secondary | ICD-10-CM | POA: Diagnosis not present

## 2022-04-06 DIAGNOSIS — M4004 Postural kyphosis, thoracic region: Secondary | ICD-10-CM

## 2022-04-06 DIAGNOSIS — M19042 Primary osteoarthritis, left hand: Secondary | ICD-10-CM

## 2022-04-06 DIAGNOSIS — M3500 Sicca syndrome, unspecified: Secondary | ICD-10-CM | POA: Diagnosis not present

## 2022-04-06 DIAGNOSIS — D72821 Monocytosis (symptomatic): Secondary | ICD-10-CM

## 2022-04-06 DIAGNOSIS — M19071 Primary osteoarthritis, right ankle and foot: Secondary | ICD-10-CM | POA: Diagnosis not present

## 2022-04-06 DIAGNOSIS — Z8261 Family history of arthritis: Secondary | ICD-10-CM

## 2022-04-06 DIAGNOSIS — M19072 Primary osteoarthritis, left ankle and foot: Secondary | ICD-10-CM

## 2022-04-06 DIAGNOSIS — Z8719 Personal history of other diseases of the digestive system: Secondary | ICD-10-CM

## 2022-04-06 DIAGNOSIS — M65342 Trigger finger, left ring finger: Secondary | ICD-10-CM | POA: Diagnosis not present

## 2022-04-06 DIAGNOSIS — M5136 Other intervertebral disc degeneration, lumbar region: Secondary | ICD-10-CM

## 2022-04-06 DIAGNOSIS — M5030 Other cervical disc degeneration, unspecified cervical region: Secondary | ICD-10-CM

## 2022-04-06 DIAGNOSIS — G609 Hereditary and idiopathic neuropathy, unspecified: Secondary | ICD-10-CM

## 2022-04-06 DIAGNOSIS — E611 Iron deficiency: Secondary | ICD-10-CM

## 2022-04-06 DIAGNOSIS — G8929 Other chronic pain: Secondary | ICD-10-CM

## 2022-04-06 DIAGNOSIS — E039 Hypothyroidism, unspecified: Secondary | ICD-10-CM

## 2022-04-06 DIAGNOSIS — E785 Hyperlipidemia, unspecified: Secondary | ICD-10-CM

## 2022-04-06 DIAGNOSIS — M8589 Other specified disorders of bone density and structure, multiple sites: Secondary | ICD-10-CM

## 2022-04-06 MED ORDER — EZETIMIBE 10 MG PO TABS
10.0000 mg | ORAL_TABLET | Freq: Every day | ORAL | 3 refills | Status: DC
  Filled 2022-04-06: qty 90, 90d supply, fill #0

## 2022-04-06 NOTE — Telephone Encounter (Signed)
Okay to stop Pravastatin, start Zetia '10mg'$  QD. FLP/LFT after 6-8 weeks.   Loel Dubonnet, NP

## 2022-04-06 NOTE — Telephone Encounter (Signed)
Please advise 

## 2022-04-06 NOTE — Patient Instructions (Signed)
Cervical Strain and Sprain Rehab Ask your health care provider which exercises are safe for you. Do exercises exactly as told by your health care provider and adjust them as directed. It is normal to feel mild stretching, pulling, tightness, or discomfort as you do these exercises. Stop right away if you feel sudden pain or your pain gets worse. Do not begin these exercises until told by your health care provider. Stretching and range-of-motion exercises Cervical side bending  Using good posture, sit on a stable chair or stand up. Without moving your shoulders, slowly tilt your left / right ear to your shoulder until you feel a stretch in the neck muscles on the opposite side. You should be looking straight ahead. Hold for __________ seconds. Repeat with the other side of your neck. Repeat __________ times. Complete this exercise __________ times a day. Cervical rotation  Using good posture, sit on a stable chair or stand up. Slowly turn your head to the side as if you are looking over your left / right shoulder. Keep your eyes level with the ground. Stop when you feel a stretch along the side and the back of your neck. Hold for __________ seconds. Repeat this by turning to your other side. Repeat __________ times. Complete this exercise __________ times a day. Thoracic extension and pectoral stretch  Roll a towel or a small blanket so it is about 4 inches (10 cm) in diameter. Lie down on your back on a firm surface. Put the towel in the middle of your back across your spine. It should not be under your shoulder blades. Put your hands behind your head and let your elbows fall out to your sides. Hold for __________ seconds. Repeat __________ times. Complete this exercise __________ times a day. Strengthening exercises Upper cervical flexion  Lie on your back with a thin pillow behind your head or a small, rolled-up towel under your neck. Gently tuck your chin toward your chest and nod  your head down to look toward your feet. Do not lift your head off the pillow. Hold for __________ seconds. Release the tension slowly. Relax your neck muscles completely before you repeat this exercise. Repeat __________ times. Complete this exercise __________ times a day. Cervical extension  Stand about 6 inches (15 cm) away from a wall, with your back facing the wall. Place a soft object, about 6-8 inches (15-20 cm) in diameter, between the back of your head and the wall. A soft object could be a small pillow, a ball, or a folded towel. Gently tilt your head back and press into the soft object. Keep your jaw and forehead relaxed. Hold for __________ seconds. Release the tension slowly. Relax your neck muscles completely before you repeat this exercise. Repeat __________ times. Complete this exercise __________ times a day. Posture and body mechanics Body mechanics refer to the movements and positions of your body while you do your daily activities. Posture is part of body mechanics. Good posture and healthy body mechanics can help to relieve stress in your body's tissues and joints. Good posture means that your spine is in its natural S-curve position (your spine is neutral), your shoulders are pulled back slightly, and your head is not tipped forward. The following are general guidelines for using improved posture and body mechanics in your everyday activities. Sitting  When sitting, keep your spine neutral and keep your feet flat on the floor. Use a footrest, if needed, and keep your thighs parallel to the floor. Avoid rounding   your shoulders. Avoid tilting your head forward. When working at a desk or a computer, keep your desk at a height where your hands are slightly lower than your elbows. Slide your chair under your desk so you are close enough to maintain good posture. When working at a computer, place your monitor at a height where you are looking straight ahead and you do not have to  tilt your head forward or downward to look at the screen. Standing  When standing, keep your spine neutral and keep your feet about hip-width apart. Keep a slight bend in your knees. Your ears, shoulders, and hips should line up. When you do a task in which you stand in one place for a long time, place one foot up on a stable object that is 2-4 inches (5-10 cm) high, such as a footstool. This helps keep your spine neutral. Resting When lying down and resting, avoid positions that are most painful for you. Try to support your neck in a neutral position. You can use a contour pillow or a small rolled-up towel. Your pillow should support your neck but not push on it. This information is not intended to replace advice given to you by your health care provider. Make sure you discuss any questions you have with your health care provider. Document Revised: 08/11/2021 Document Reviewed: 08/11/2021 Elsevier Patient Education  2023 [employer]  

## 2022-04-13 DIAGNOSIS — R293 Abnormal posture: Secondary | ICD-10-CM | POA: Diagnosis not present

## 2022-04-13 DIAGNOSIS — M25551 Pain in right hip: Secondary | ICD-10-CM | POA: Diagnosis not present

## 2022-04-13 DIAGNOSIS — M542 Cervicalgia: Secondary | ICD-10-CM | POA: Diagnosis not present

## 2022-04-17 ENCOUNTER — Encounter (HOSPITAL_BASED_OUTPATIENT_CLINIC_OR_DEPARTMENT_OTHER): Payer: Self-pay | Admitting: Pharmacist

## 2022-04-17 ENCOUNTER — Other Ambulatory Visit (HOSPITAL_BASED_OUTPATIENT_CLINIC_OR_DEPARTMENT_OTHER): Payer: Self-pay

## 2022-04-17 ENCOUNTER — Ambulatory Visit: Payer: Commercial Managed Care - PPO | Admitting: Physician Assistant

## 2022-04-17 ENCOUNTER — Encounter: Payer: Self-pay | Admitting: Physician Assistant

## 2022-04-17 VITALS — BP 117/76 | HR 62 | Wt 94.0 lb

## 2022-04-17 DIAGNOSIS — G43009 Migraine without aura, not intractable, without status migrainosus: Secondary | ICD-10-CM

## 2022-04-17 DIAGNOSIS — G43709 Chronic migraine without aura, not intractable, without status migrainosus: Secondary | ICD-10-CM | POA: Insufficient documentation

## 2022-04-17 DIAGNOSIS — M62838 Other muscle spasm: Secondary | ICD-10-CM | POA: Diagnosis not present

## 2022-04-17 DIAGNOSIS — M7918 Myalgia, other site: Secondary | ICD-10-CM | POA: Diagnosis not present

## 2022-04-17 MED ORDER — NURTEC 75 MG PO TBDP
75.0000 mg | ORAL_TABLET | Freq: Every day | ORAL | 6 refills | Status: DC | PRN
  Filled 2022-04-17: qty 16, 30d supply, fill #0

## 2022-04-17 MED ORDER — GABAPENTIN 100 MG PO CAPS
100.0000 mg | ORAL_CAPSULE | Freq: Three times a day (TID) | ORAL | 1 refills | Status: DC
  Filled 2022-04-17: qty 270, 90d supply, fill #0
  Filled 2022-12-21: qty 270, 90d supply, fill #1

## 2022-04-17 NOTE — Patient Instructions (Signed)
Migraine Headache A migraine headache is a very strong throbbing pain on one or both sides of your head. This type of headache can also cause other symptoms. It can last from 4 hours to 3 days. Talk with your doctor about what things may bring on (trigger) this condition. What are the causes? The exact cause of a migraine is not known. This condition may be brought on or caused by: Smoking. Medicines, such as: Medicine used to treat chest pain (nitroglycerin). Birth control pills. Estrogen. Some blood pressure medicines. Certain substances in some foods or drinks. Foods and drinks, such as: Cheese. Chocolate. Alcohol. Caffeine. Doing physical activity that is very hard. Other things that may trigger a migraine headache include: Periods. Pregnancy. Hunger. Stress. Getting too much or too little sleep. Weather changes. Feeling tired (fatigue). What increases the risk? Being 25-55 years old. Being female. Having a family history of migraine headaches. Being Caucasian. Having a mental health condition, such as being sad (depressed) or feeling worried or nervous (anxious). Being very overweight (obese). What are the signs or symptoms? A throbbing pain. This pain may: Happen in any area of the head, such as on one or both sides. Make it hard to do daily activities. Get worse with physical activity. Get worse around bright lights, loud noises, or smells. Other symptoms may include: Feeling like you may vomit (nauseous). Vomiting. Dizziness. Before a migraine headache starts, you may get warning signs (an aura). An aura may include: Seeing flashing lights or having blind spots. Seeing bright spots, halos, or zigzag lines. Having tunnel vision or blurred vision. Having numbness or a tingling feeling. Having trouble talking. Having weak muscles. After a migraine ends, you may have symptoms. These may include: Tiredness. Trouble thinking (concentrating). How is this  treated? Taking medicines that: Relieve pain. Relieve the feeling like you may vomit. Prevent migraine headaches. Treatment may also include: Acupuncture. Lifestyle changes like avoiding foods that bring on migraine headaches. Learning ways to control your body functions (biofeedback). Therapy to help you know and deal with negative thoughts (cognitive behavioral therapy). Follow these instructions at home: Medicines Take over-the-counter and prescription medicines only as told by your doctor. If told, take steps to prevent problems with pooping (constipation). You may need to: Drink enough fluid to keep your pee (urine) pale yellow. Take medicines. You will be told what medicines to take. Eat foods that are high in fiber. These include beans, whole grains, and fresh fruits and vegetables. Limit foods that are high in fat and sugar. These include fried or sweet foods. Ask your doctor if you should avoid driving or using machines while you are taking your medicine. Lifestyle  Do not drink alcohol. Do not smoke or use any products that contain nicotine or tobacco. If you need help quitting, ask your doctor. Get 7-9 hours of sleep each night, or the amount recommended by your doctor. Find ways to deal with stress, such as meditation, deep breathing, or yoga. Try to exercise often. This can help lessen how bad and how often your migraines happen. General instructions Keep a journal to find out what may bring on your migraine headaches. This can help you avoid those things. For example, write down: What you eat and drink. How much sleep you get. Any change to your medicines or diet. If you have a migraine headache: Avoid things that make your symptoms worse, such as bright lights. Lie down in a dark, quiet room. Do not drive or use machinery. Ask your   doctor what activities are safe for you. Where to find more information Coalition for Headache and Migraine Patients (CHAMP):  headachemigraine.org American Migraine Foundation: americanmigrainefoundation.org National Headache Foundation: headaches.org Contact a doctor if: You get a migraine headache that is different or worse than others you have had. You have more than 15 days of headaches in one month. Get help right away if: Your migraine headache gets very bad. Your migraine headache lasts more than 72 hours. You have a fever or stiff neck. You have trouble seeing. Your muscles feel weak or like you cannot control them. You lose your balance a lot. You have trouble walking. You faint. You have a seizure. This information is not intended to replace advice given to you by your health care provider. Make sure you discuss any questions you have with your health care provider. Document Revised: 09/15/2021 Document Reviewed: 09/15/2021 Elsevier Patient Education  2023 Elsevier Inc.  

## 2022-04-17 NOTE — Progress Notes (Signed)
CC: Imitrex helpful with headaches  X years  Last year worse   History:  Marilyn Collins is a 67 y.o. who presents to clinic today for evaluation of headache.  She has had migraines for years and was previously seen in this office by Monna Fam.  She has been managing them with imitrex 50mg .  Earlier this year she had worsening of headaches and they were much more frequent.  She has noticed some improvement over the past 2 weeks and is uncertain why.  She has been dealing with significant neck/back/shoulder pain for which she has been seeing Dr. Ernestina Patches.  She has received trigger point injections (most recently in December) and is regularly going to [facility] for dry needling, body work and cupping.  She is also taking baclofen and gabapentin 100mg  qd.  These things do help but she still has nonstop,continuous pain.  She is unable to pick up her 61 year old grandchild related to this pain.  She exercises at [facility] with cardio, yoga and pilates.  She has a gluten free diet due to celiac and is eating very blandly due to other digestion issues.  She works nights, 24 hours one week and 18 hours the alternate week.  She will be 67 in June and plans to cut back hours a bit more, but continue working nights.    Number of days in the last 4 weeks with:  Severe headache:0   Moderate headache: 3 Mild headache: 2  No headache: 23   Past Medical History:  Diagnosis Date   Acquired hypothyroidism 02/03/2018   Anxiety    Aortic atherosclerosis ([place]) 11/24/2021   Arthritis    Celiac disease 07/20/2011   Chronic idiopathic monocytosis 07/20/2011   Dysphagia 12/2012   responsive to dexilant   GERD (gastroesophageal reflux disease)    H/O eating disorder    Hypothyroidism    Iron deficiency anemia 07/20/2011   Iron deficiency anemia, unspecified 07/20/2011   Migraine headache    Other specified intestinal malabsorption 07/20/2011   Palpitations 11/24/2021    Social History   Socioeconomic  History   Marital status: Married    Spouse name: Not on file   Number of children: 2   Years of education: Not on file   Highest education level: Not on file  Occupational History   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, multimedia: [employer]  Tobacco Use   Smoking status: Never    Passive exposure: Never   Smokeless tobacco: Never  Vaping Use   Vaping Use: Never used  Substance and Sexual Activity   Alcohol use: No    Alcohol/week: 0.0 standard drinks of alcohol   Drug use: No   Sexual activity: Yes    Birth control/protection: Surgical  Other Topics Concern   Not on file  Social History Narrative   RN at [employer]   Married   2 boys   Social Determinants of Health   Financial Resource Strain: Not on file  Food Insecurity: Not on file  Transportation Needs: Not on file  Physical Activity: Not on file  Stress: Not on file  Social Connections: Not on file  Intimate Partner Violence: Not on file    Family History  Problem Relation Age of Onset   Cancer Mother    Heart disease Mother    Hyperlipidemia Father    Hypertension Father    Stroke Father    CAD Father    Hypertension Brother    Cancer  Maternal Grandmother    Stroke Maternal Grandfather    Heart disease Paternal Grandmother    CAD Paternal Grandfather    Anxiety disorder Son    Bipolar disorder Son     Allergies  Allergen Reactions   Morphine And Related Nausea And Vomiting   Venofer [Ferric Oxide] Anaphylaxis    Current Outpatient Medications on File Prior to Visit  Medication Sig Dispense Refill   Acetaminophen (TYLENOL ARTHRITIS PAIN PO) Take by mouth at bedtime.     baclofen (LIORESAL) 10 MG tablet Take 0.5 tablet (5 mg dose) by mouth twice daily as needed. 30 tablet 0   bifidobacterium infantis (ALIGN) capsule Take 1 capsule by mouth daily.     CEQUA 0.09 % SOLN 1 drop BID OU 180 each 3   clonazePAM (KLONOPIN) 0.5 MG tablet 1/2 to 1 tablet Orally Twice a day as needed 30 days (Patient taking  differently: 1/4 tab daily at bedtime) 60 tablet 1   diclofenac Sodium (VOLTAREN) 1 % GEL Apply topically as needed.     estradiol (VIVELLE-DOT) 0.05 MG/24HR patch Place 1 patch (0.05 mg total) onto the skin 2 (two) times a week. (Patient taking differently: Place 0.5 patches onto the skin 2 (two) times a week.) 12 patch 11   ezetimibe (ZETIA) 10 MG tablet Take 1 tablet (10 mg total) by mouth daily. 90 tablet 3   gabapentin (NEURONTIN) 100 MG capsule Take 1 capsule (100 mg total) by mouth at bedtime. 90 capsule 2   levothyroxine (SYNTHROID) 112 MCG tablet Take 1 tablet (112 mcg total) by mouth in the morning on an empty stomach for 3 days each week. 36 tablet 2   levothyroxine (SYNTHROID) 125 MCG tablet Take 1 tablet (125 mcg total) by mouth in the morning on an empty stomach 4 days a week. 48 tablet 3   Methylcellulose, Laxative, (CITRUCEL PO) Take 1 scoop by mouth daily.     Multiple Vitamin (MULTIVITAMIN PO) Take by mouth daily.     omeprazole (PRILOSEC) 40 MG capsule Take 1 capsule by mouth daily 30 minutes before morning meal 90 capsule 2   pravastatin (PRAVACHOL) 20 MG tablet Take 1 tablet (20 mg total) by mouth every evening. May start with three days per week and gradually increase to daily. 30 tablet 2   SUMAtriptan (IMITREX) 50 MG tablet TAKE 1 TABLET BY MOUTH AT ONSET OF MIGRAINE AS NEEDED, MAY REPEAT IN 2 HOURS IF HEADACHE PERSISTS OR RECURS 90 days 27 tablet 3   No current facility-administered medications on file prior to visit.     Review of Systems:  All pertinent positive/negative included in HPI, all other review of systems are negative   Objective:  Physical Exam BP 117/76   Pulse 62   Wt 94 lb (42.6 kg)   BMI 14.72 kg/m  CONSTITUTIONAL: Well-developed, well-nourished female in no acute distress.  EYES: EOM intact ENT: Normocephalic CARDIOVASCULAR: Regular rate RESPIRATORY: Normal rate.  MUSCULOSKELETAL: Normal ROM,  muscle spasm noted SKIN: Warm, dry without  erythema  NEUROLOGICAL: Alert, oriented, CN II-XII grossly intact, Appropriate balance PSYCH: Normal behavior, mood   Assessment & Plan:  Assessment: 1. Migraine without aura and without status migrainosus, not intractable   2. Myofascial pain syndrome   3. Muscle spasm      Plan: Trial Nurtec in place of Imitrex due to safety.  Samples provided.  Pt has tried other triptans in the past including rizatriptan.   Pt to try increasing gabapentin as tolerated.  So  far she has only used 100mg  once daily.  She is encouraged to use when she feels like she can tolerate up to 3/day in divided dose.  With pt working nights will allow her to determine best dosing regimen.  Pt interested in pursuing botox.  We will work toward submitting for insurance approval.  Pt will continue with baclofen for now as she is nervous about sedation with others in this class.  We may trial at later date. Pt will continue working with pcp on cholesterol medication.    Follow-up  PRN  51 minutes spent face to face with patient.  Paticia Stack, PA-C 04/17/2022 8:48 AM

## 2022-04-20 DIAGNOSIS — H524 Presbyopia: Secondary | ICD-10-CM | POA: Diagnosis not present

## 2022-04-20 DIAGNOSIS — H16223 Keratoconjunctivitis sicca, not specified as Sjogren's, bilateral: Secondary | ICD-10-CM | POA: Diagnosis not present

## 2022-04-22 ENCOUNTER — Encounter: Payer: Self-pay | Admitting: *Deleted

## 2022-04-28 ENCOUNTER — Encounter: Payer: Self-pay | Admitting: Physical Medicine and Rehabilitation

## 2022-04-28 ENCOUNTER — Other Ambulatory Visit: Payer: Self-pay | Admitting: Physical Medicine and Rehabilitation

## 2022-04-28 DIAGNOSIS — M25551 Pain in right hip: Secondary | ICD-10-CM | POA: Diagnosis not present

## 2022-04-28 DIAGNOSIS — R293 Abnormal posture: Secondary | ICD-10-CM | POA: Diagnosis not present

## 2022-04-28 DIAGNOSIS — M542 Cervicalgia: Secondary | ICD-10-CM | POA: Diagnosis not present

## 2022-04-28 DIAGNOSIS — M461 Sacroiliitis, not elsewhere classified: Secondary | ICD-10-CM

## 2022-04-30 ENCOUNTER — Telehealth: Payer: Self-pay

## 2022-04-30 NOTE — Telephone Encounter (Signed)
I scheduled patient for Monday patient called back and had a insurance question please help

## 2022-05-01 ENCOUNTER — Encounter: Payer: Self-pay | Admitting: Physical Medicine and Rehabilitation

## 2022-05-01 ENCOUNTER — Other Ambulatory Visit (HOSPITAL_BASED_OUTPATIENT_CLINIC_OR_DEPARTMENT_OTHER): Payer: Self-pay

## 2022-05-04 ENCOUNTER — Ambulatory Visit: Payer: Commercial Managed Care - PPO | Admitting: Physical Medicine and Rehabilitation

## 2022-05-04 NOTE — Telephone Encounter (Signed)
Patient rescheduled for 06/22/22

## 2022-05-16 ENCOUNTER — Other Ambulatory Visit (HOSPITAL_BASED_OUTPATIENT_CLINIC_OR_DEPARTMENT_OTHER): Payer: Self-pay

## 2022-05-18 ENCOUNTER — Other Ambulatory Visit (HOSPITAL_BASED_OUTPATIENT_CLINIC_OR_DEPARTMENT_OTHER): Payer: Self-pay

## 2022-05-21 DIAGNOSIS — M25551 Pain in right hip: Secondary | ICD-10-CM | POA: Diagnosis not present

## 2022-05-21 DIAGNOSIS — M542 Cervicalgia: Secondary | ICD-10-CM | POA: Diagnosis not present

## 2022-05-21 DIAGNOSIS — R293 Abnormal posture: Secondary | ICD-10-CM | POA: Diagnosis not present

## 2022-05-26 ENCOUNTER — Other Ambulatory Visit: Payer: Self-pay | Admitting: *Deleted

## 2022-05-26 ENCOUNTER — Other Ambulatory Visit (HOSPITAL_BASED_OUTPATIENT_CLINIC_OR_DEPARTMENT_OTHER): Payer: Self-pay

## 2022-05-26 MED ORDER — UBRELVY 100 MG PO TABS
100.0000 mg | ORAL_TABLET | ORAL | 8 refills | Status: DC | PRN
  Filled 2022-05-26: qty 10, 30d supply, fill #0
  Filled 2022-06-02: qty 16, 30d supply, fill #0

## 2022-05-27 ENCOUNTER — Other Ambulatory Visit (HOSPITAL_BASED_OUTPATIENT_CLINIC_OR_DEPARTMENT_OTHER): Payer: Self-pay

## 2022-06-02 ENCOUNTER — Other Ambulatory Visit (HOSPITAL_BASED_OUTPATIENT_CLINIC_OR_DEPARTMENT_OTHER): Payer: Self-pay

## 2022-06-02 ENCOUNTER — Encounter (HOSPITAL_BASED_OUTPATIENT_CLINIC_OR_DEPARTMENT_OTHER): Payer: Self-pay

## 2022-06-02 ENCOUNTER — Encounter: Payer: Self-pay | Admitting: Oncology

## 2022-06-09 ENCOUNTER — Other Ambulatory Visit (HOSPITAL_BASED_OUTPATIENT_CLINIC_OR_DEPARTMENT_OTHER): Payer: Self-pay

## 2022-06-10 ENCOUNTER — Other Ambulatory Visit (HOSPITAL_BASED_OUTPATIENT_CLINIC_OR_DEPARTMENT_OTHER): Payer: Self-pay

## 2022-06-10 ENCOUNTER — Encounter (HOSPITAL_BASED_OUTPATIENT_CLINIC_OR_DEPARTMENT_OTHER): Payer: Self-pay | Admitting: Pharmacist

## 2022-06-11 ENCOUNTER — Other Ambulatory Visit (HOSPITAL_BASED_OUTPATIENT_CLINIC_OR_DEPARTMENT_OTHER): Payer: Self-pay

## 2022-06-11 MED ORDER — ESTRADIOL 0.1 MG/24HR TD PTTW
MEDICATED_PATCH | TRANSDERMAL | 0 refills | Status: DC
  Filled 2022-06-11: qty 24, 90d supply, fill #0

## 2022-06-11 MED ORDER — SUMATRIPTAN SUCCINATE 50 MG PO TABS
50.0000 mg | ORAL_TABLET | ORAL | 1 refills | Status: DC
  Filled 2022-06-11: qty 27, 90d supply, fill #0
  Filled 2022-12-11: qty 27, 90d supply, fill #1

## 2022-06-15 ENCOUNTER — Other Ambulatory Visit (HOSPITAL_BASED_OUTPATIENT_CLINIC_OR_DEPARTMENT_OTHER): Payer: Self-pay

## 2022-06-16 DIAGNOSIS — R293 Abnormal posture: Secondary | ICD-10-CM | POA: Diagnosis not present

## 2022-06-16 DIAGNOSIS — M25551 Pain in right hip: Secondary | ICD-10-CM | POA: Diagnosis not present

## 2022-06-16 DIAGNOSIS — M542 Cervicalgia: Secondary | ICD-10-CM | POA: Diagnosis not present

## 2022-06-17 ENCOUNTER — Other Ambulatory Visit (HOSPITAL_BASED_OUTPATIENT_CLINIC_OR_DEPARTMENT_OTHER): Payer: Self-pay

## 2022-06-22 ENCOUNTER — Other Ambulatory Visit: Payer: Self-pay

## 2022-06-22 ENCOUNTER — Ambulatory Visit (INDEPENDENT_AMBULATORY_CARE_PROVIDER_SITE_OTHER): Payer: Commercial Managed Care - PPO | Admitting: Physical Medicine and Rehabilitation

## 2022-06-22 DIAGNOSIS — M461 Sacroiliitis, not elsewhere classified: Secondary | ICD-10-CM

## 2022-06-22 NOTE — Progress Notes (Unsigned)
Marilyn Collins - 67 y.o. female MRN [id number]  Date of birth: [birth year]  Office Visit Note: Visit Date: 06/22/2022 PCP: [facility] Referred by: [facility]*  Subjective: Chief Complaint  Patient presents with  . Lower Back - Pain   HPI:  Marilyn Collins is a 67 y.o. female who comes in todayHPI ROS Otherwise per HPI.  Assessment & Plan: Visit Diagnoses: No diagnosis found.  Plan: No additional findings.   Meds & Orders: No orders of the defined types were placed in this encounter.  No orders of the defined types were placed in this encounter.   Follow-up: No follow-ups on file.   Procedures: Sacroiliac Joint Inj on 06/22/2022 3:53 PM Indications: pain and diagnostic evaluation Details: 22 G 3.5 in needle, fluoroscopy-guided posterior approach Medications (Right): 40 mg triamcinolone acetonide 40 MG/ML; 2 mL bupivacaine 0.25 % Medications (Left): 40 mg triamcinolone acetonide 40 MG/ML; 2 mL bupivacaine 0.25 % Outcome: tolerated well, no immediate complications  Sacroiliac Joint Intra-Articular Injection - Posterior Approach with Fluoroscopic Guidance   Position: PRONE  Additional Comments: Vital signs were monitored before and after the procedure. Patient was prepped and draped in the usual sterile fashion. The correct patient, procedure, and site was verified.   Injection Procedure Details:   Location/Site:  Sacroiliac joint  Needle size: 3.5 in Spinal Needle  Needle type: Spinal  Needle Placement: Intra-articular  Findings:  -Comments: There was excellent flow of contrast producing a partial arthrogram of the sacroiliac joint.   Procedure Details: Starting with a 90 degree vertical and midline orientation the fluoroscope was tilted cranially 20 to 25 degrees and the target area of the inferior most part of the SI joint on the side mentioned above was visualized.  The soft tissues overlying this target were infiltrated with 4  ml. of 1% Lidocaine without Epinephrine. A #22 gauge spinal needle was inserted perpendicular to the fluoroscope table and advanced into the posterior inferior joint space using fluoroscopic guidance.  Position in the joint space was confirmed by obtaining a partial arthrogram using a 2 ml. volume of Isovue-250 contrast agent. After negative aspirate for gross pus or blood, the injectate was delivered to the joint. Radiographs were obtained for documentation purposes.   Additional Comments:   Dressing: Bandaid    Post-procedure details: Patient was observed during the procedure. Post-procedure instructions were reviewed.  Patient left the clinic in stable condition.    There was excellent flow of contrast producing a partial arthrogram of the sacroiliac joint.  Procedure, treatment alternatives, risks and benefits explained, specific risks discussed. Consent was given by the patient. Immediately prior to procedure a time out was called to verify the correct patient, procedure, equipment, support staff and site/side marked as required. Patient was prepped and draped in the usual sterile fashion.         Clinical History: XR Pelvic 1-2 Views 11/19/2020  No SI joint sclerosis or narrowing was noted.  Some osteoarthritic changes  were noted in the SI joints.  No hip joint narrowing was noted.   Impression: These findings are consistent with mild osteoarthritic changes  of the SI joints. -------------------------------- MRI LUMBAR SPINE WITHOUT CONTRAST  02/17/2011  Technique:  Multiplanar and multiecho pulse sequences of the lumbar  spine were obtained without intravenous contrast.   Comparison: Lumbar MRI 03/06/2004.   Findings: Five lumbar type vertebral bodies are assumed.  The  alignment is stable.  Scattered hemangiomas are stable, most  prominent at T11.  Compared with the prior examinations, the  patient has developed diffusely decreased marrow signal throughout  the  spine. There are no new focal marrow lesions.  In addition, the  liver demonstrates diffusely decreased T2 signal.  The spleen is  not imaged.   The conus medullaris extends to the L1-L2 level and appears normal.  Bilateral sacral Tarlov cysts are stable.  There are no paraspinal  abnormalities.   Based on the sagittal images, mild disc bulging at T10-T11 appears  stable.   There are no significant disc space findings from T11-T12 through  L2-L3.   L3-L4:  Disc height and hydration are maintained.  There is mild  bilateral facet hypertrophy without resulting spinal stenosis or  nerve root encroachment.   L4-L5:  Disc desiccation and a small posterolateral disc protrusion  on the right are chronic and unchanged.  There is mild bilateral  facet hypertrophy.  No foraminal compromise or nerve root  encroachment is present.   L5-S1:  Disc height and hydration are maintained.  There is mild  bilateral facet hypertrophy without resulting foraminal compromise  or nerve root encroachment.   IMPRESSION:   1.  Stable small posterolateral disc protrusion on the right at L4-  L5.  No resulting spinal stenosis or nerve root encroachment.  2.  Mild facet disease inferiorly.  3.  No acute disc space findings, significant spinal stenosis or  nerve root encroachment.  4.  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 Correlate clinically.     Objective:  VS:  HT:    WT:   BMI:     BP:   HR: bpm  TEMP: ( )  RESP:  Physical Exam   Imaging: No results found.

## 2022-06-22 NOTE — Progress Notes (Unsigned)
Functional Pain Scale - descriptive words and definitions  Moderate (4)   Constantly aware of pain, can complete ADLs with modification/sleep marginally affected at times/passive distraction is of no use, but active distraction gives some relief. Moderate range order  Average Pain 6  120/76 +Driver, -BT, -Dye Allergies.

## 2022-06-23 MED ORDER — TRIAMCINOLONE ACETONIDE 40 MG/ML IJ SUSP
40.0000 mg | INTRAMUSCULAR | Status: AC | PRN
Start: 2022-06-22 — End: 2022-06-22
  Administered 2022-06-22: 40 mg via INTRA_ARTICULAR

## 2022-06-23 MED ORDER — BUPIVACAINE HCL 0.25 % IJ SOLN
2.0000 mL | INTRAMUSCULAR | Status: AC | PRN
Start: 2022-06-22 — End: 2022-06-22
  Administered 2022-06-22: 2 mL via INTRA_ARTICULAR

## 2022-07-03 ENCOUNTER — Other Ambulatory Visit (HOSPITAL_BASED_OUTPATIENT_CLINIC_OR_DEPARTMENT_OTHER): Payer: Self-pay

## 2022-07-03 ENCOUNTER — Encounter: Payer: Self-pay | Admitting: Oncology

## 2022-07-04 ENCOUNTER — Other Ambulatory Visit (HOSPITAL_BASED_OUTPATIENT_CLINIC_OR_DEPARTMENT_OTHER): Payer: Self-pay

## 2022-07-04 ENCOUNTER — Encounter: Payer: Self-pay | Admitting: Oncology

## 2022-07-07 ENCOUNTER — Ambulatory Visit (HOSPITAL_BASED_OUTPATIENT_CLINIC_OR_DEPARTMENT_OTHER): Payer: Commercial Managed Care - PPO | Admitting: Cardiovascular Disease

## 2022-07-07 IMAGING — MR MR PELVIS W/O CM
4 of 5 series · 30 of 48 positions shown · non-contrast
Comparison: None.

CLINICAL DATA: Pelvic pain, right buttock pain

EXAM:
MRI PELVIS WITHOUT CONTRAST
TECHNIQUE: Multiplanar multisequence MR imaging of the pelvis was performed. No
intravenous contrast was administered.

[Series 3: T2 fat-sat · sagittal · 4.0mm · 0.47mm/px · 9 of 65 slices shown]
[im 1/65]
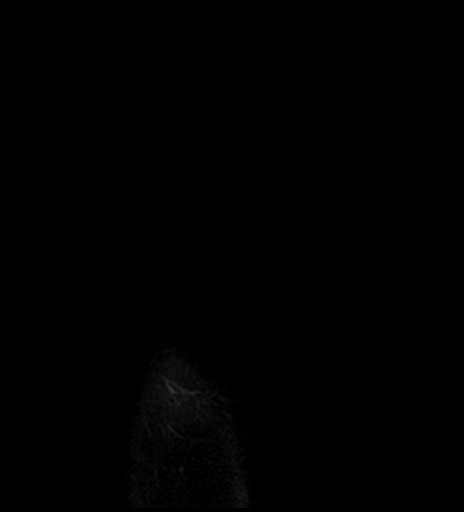
[im 12/65]
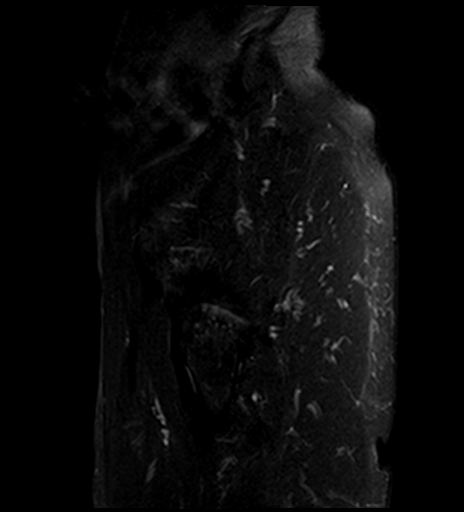
[im 18/65]
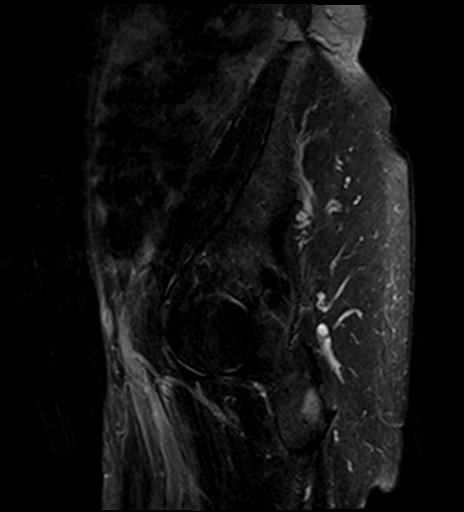
[im 30/65]
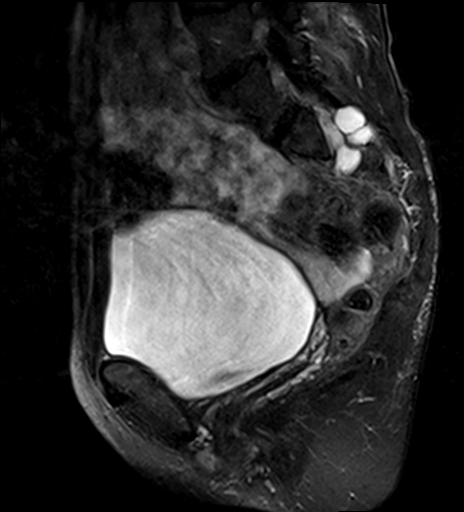
[im 35/65]
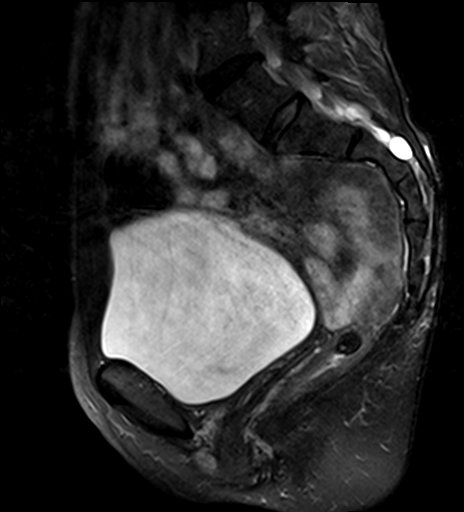
[im 47/65]
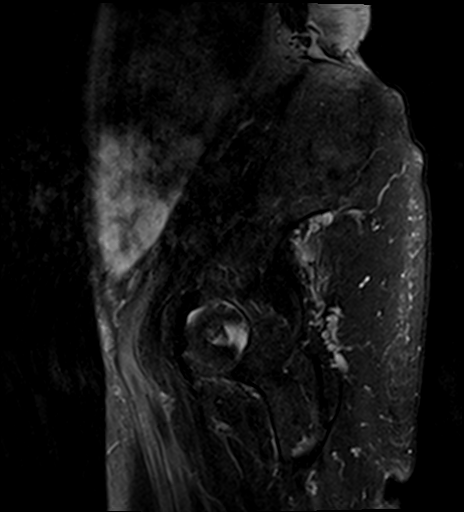
[im 53/65]
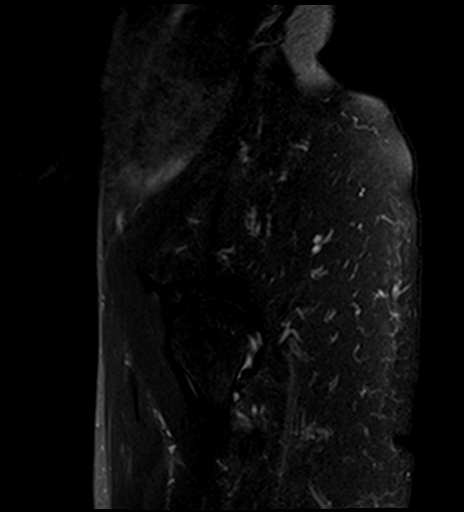
[im 59/65]
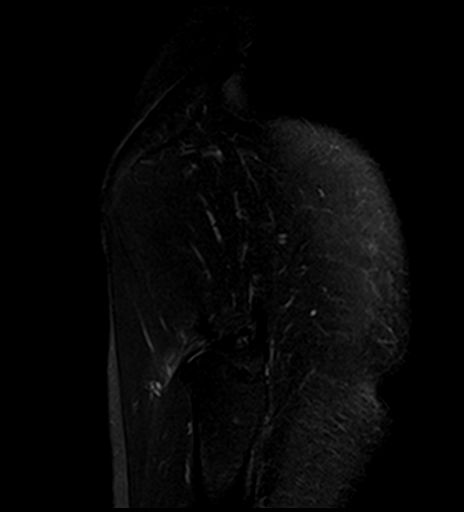
[im 65/65]
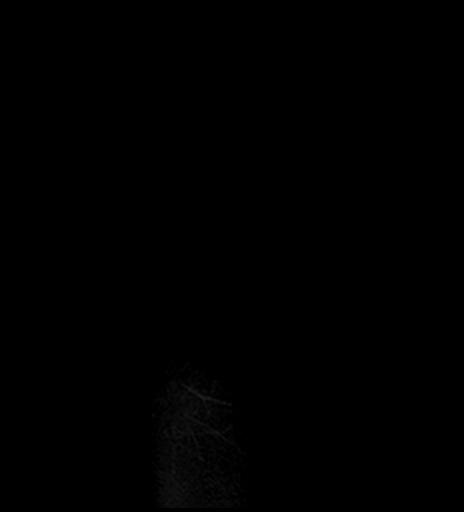

[Series 4: T1 · coronal · 4.0mm · 1.56mm/px · 8 of 38 slices shown (1 of 2)]
[im 1/38]
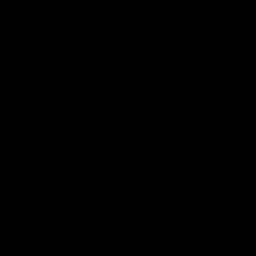
[im 6/38]
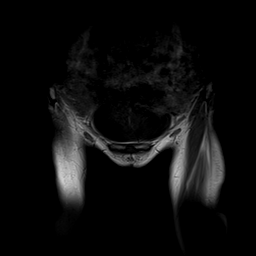
[im 11/38]
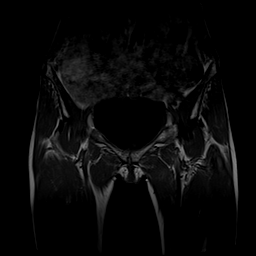
[im 16/38]
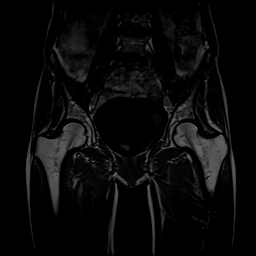
[im 22/38]
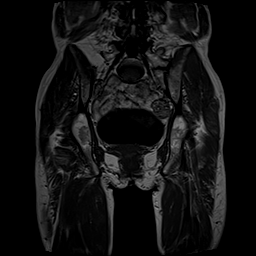
[im 27/38]
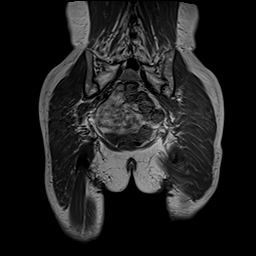
[im 32/38]
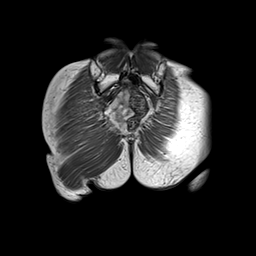
[im 38/38]
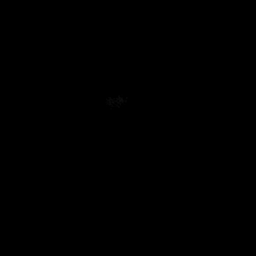

[Series 5: STIR · coronal · 4.0mm · 1.56mm/px · 8 of 38 slices shown]
[im 1/38]
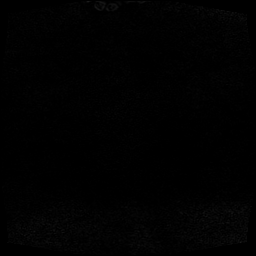
[im 6/38]
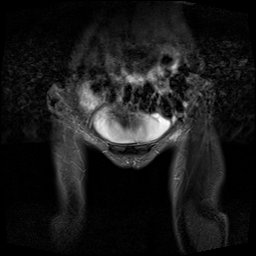
[im 11/38]
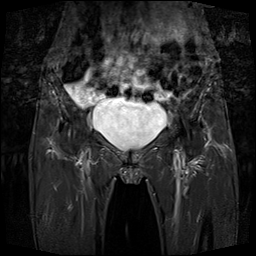
[im 16/38]
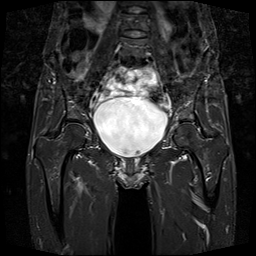
[im 22/38]
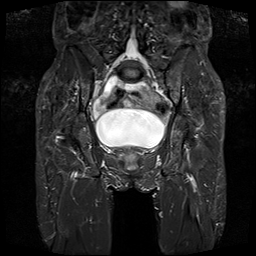
[im 27/38]
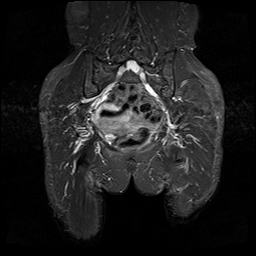
[im 32/38]
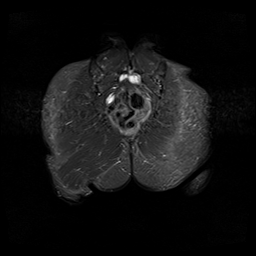
[im 38/38]
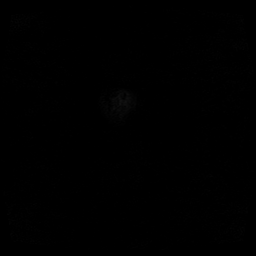

[Series 6: T1 · axial · 4.0mm · 0.62mm/px · z∈[-130,+90]mm · 5 of 51 slices shown (2 of 2)]
[im 1/51]
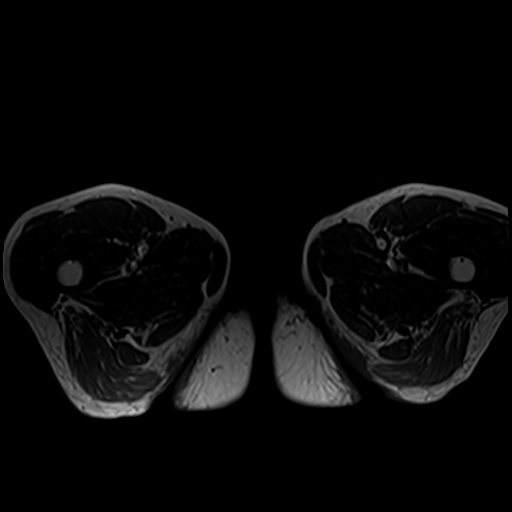
[im 6/51]
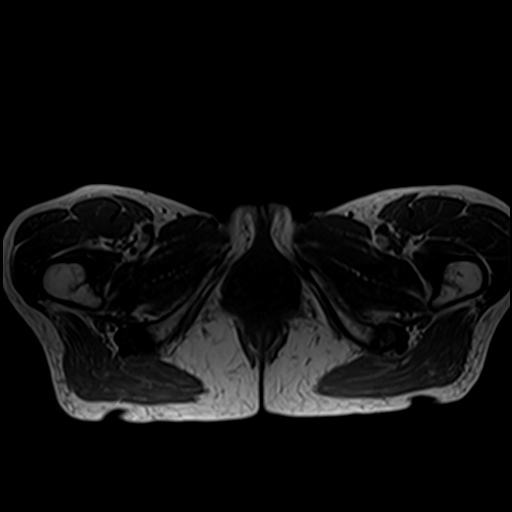
[im 17/51]
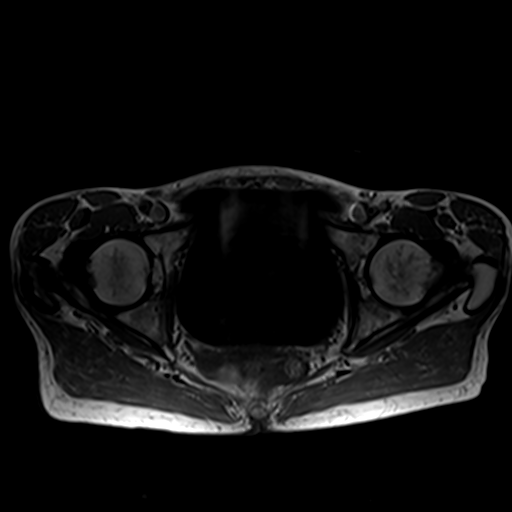
[im 28/51]
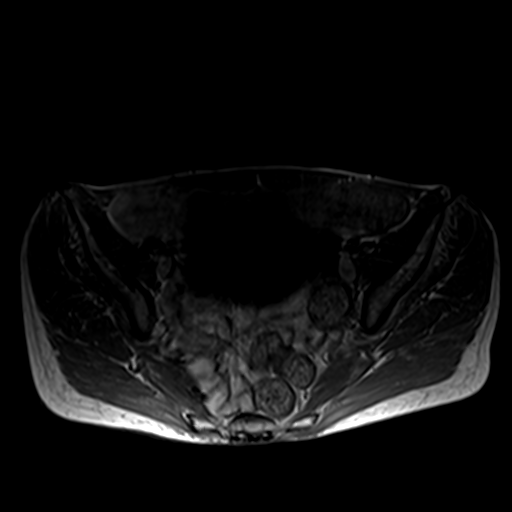
[im 45/51]
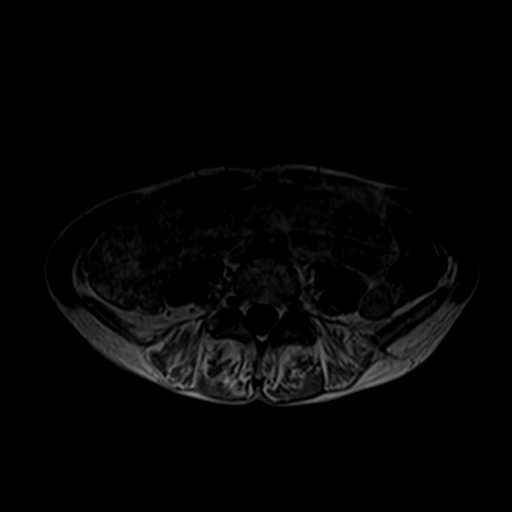

[30 of 48 positions shown; findings below may reference images not displayed]

FINDINGS: Bones:

No hip fracture, dislocation or avascular necrosis. No periosteal
reaction or bone destruction. No aggressive osseous lesion.

Normal sacrum and sacroiliac joints. No SI joint widening or erosive
changes.

Degenerative disease with disc desiccation at L4-5. 2 mm
retrolisthesis of L4 on L5. Broad-based disc bulge at L4-5 with a
right paracentral annular fissure.

Articular cartilage and labrum

Articular cartilage: Partial-thickness cartilage loss of the left
superior acetabulum with subchondral marrow edema. Otherwise no
focal chondral defect.

Labrum: Grossly intact, but evaluation is limited by lack of
intraarticular fluid.

Joint or bursal effusion

Joint effusion:  No hip joint effusion.  No SI joint effusion.

Bursae:  No bursa formation.

Muscles and tendons

Flexors: Normal.

Extensors: Normal.

Abductors: Normal.

Adductors: Normal.

Gluteals: Normal.

Hamstrings: Mild tendinosis of the right hamstring origin with mild
subcortical marrow edema in the ischial tuberosity as can be seen
with enthesitis. Mild tendinosis of the left hamstring origin with
mild subcortical marrow edema in the ischial tuberosity.

Other findings

No pelvic free fluid. No fluid collection or hematoma. No inguinal
lymphadenopathy. No inguinal hernia.
IMPRESSION: 1. Mild tendinosis of the right hamstring origin with mild
subcortical marrow edema in the ischial tuberosity as can be seen
with enthesitis.
2. Mild tendinosis of the left hamstring origin with mild
subcortical marrow edema in the ischial tuberosity.
3. Partial-thickness cartilage loss of the left superior acetabulum
with subchondral marrow edema.
4. Lumbar spine spondylosis at L4-5.

## 2022-07-09 ENCOUNTER — Encounter (HOSPITAL_BASED_OUTPATIENT_CLINIC_OR_DEPARTMENT_OTHER): Payer: Self-pay | Admitting: Cardiovascular Disease

## 2022-07-14 ENCOUNTER — Other Ambulatory Visit (HOSPITAL_BASED_OUTPATIENT_CLINIC_OR_DEPARTMENT_OTHER): Payer: Self-pay

## 2022-07-14 DIAGNOSIS — E785 Hyperlipidemia, unspecified: Secondary | ICD-10-CM | POA: Diagnosis not present

## 2022-07-15 LAB — HEPATIC FUNCTION PANEL
ALT: 23 IU/L (ref 0–32)
AST: 23 IU/L (ref 0–40)
Albumin: 3.9 g/dL (ref 3.9–4.9)
Alkaline Phosphatase: 59 IU/L (ref 44–121)
Bilirubin Total: 0.5 mg/dL (ref 0.0–1.2)
Bilirubin, Direct: 0.11 mg/dL (ref 0.00–0.40)
Total Protein: 6.5 g/dL (ref 6.0–8.5)

## 2022-07-15 LAB — LIPID PANEL
Chol/HDL Ratio: 2.1 ratio (ref 0.0–4.4)
Cholesterol, Total: 166 mg/dL (ref 100–199)
HDL: 78 mg/dL (ref >39)
LDL Chol Calc (NIH): 79 mg/dL (ref 0–99)
Triglycerides: 41 mg/dL (ref 0–149)
VLDL Cholesterol Cal: 9 mg/dL (ref 5–40)

## 2022-07-16 ENCOUNTER — Encounter (HOSPITAL_BASED_OUTPATIENT_CLINIC_OR_DEPARTMENT_OTHER): Payer: Self-pay

## 2022-07-23 ENCOUNTER — Telehealth: Payer: Self-pay | Admitting: Internal Medicine

## 2022-07-23 NOTE — Telephone Encounter (Signed)
Patient called requesting a transfer of care over to Dr. Margretta Sidle from [facility].

## 2022-07-29 ENCOUNTER — Telehealth: Payer: Self-pay | Admitting: Internal Medicine

## 2022-07-29 ENCOUNTER — Encounter: Payer: Self-pay | Admitting: Internal Medicine

## 2022-07-29 NOTE — Telephone Encounter (Signed)
Ok with me 

## 2022-07-29 NOTE — Telephone Encounter (Signed)
Hi Dr. Rhea Belton,   Patient called requesting a transfer of care from [facility] and asked specifically for you to possibly continue her GI care. She had her records sent over for you to review and advise.    Thank you

## 2022-07-31 ENCOUNTER — Encounter: Payer: Self-pay | Admitting: Internal Medicine

## 2022-08-04 ENCOUNTER — Encounter: Payer: Self-pay | Admitting: Rheumatology

## 2022-08-04 NOTE — Telephone Encounter (Signed)
I returned patient's call and discussed that we refer patients to [facility] and [facility].  Patient voiced understanding.

## 2022-08-10 ENCOUNTER — Other Ambulatory Visit (HOSPITAL_BASED_OUTPATIENT_CLINIC_OR_DEPARTMENT_OTHER): Payer: Self-pay

## 2022-08-10 DIAGNOSIS — E039 Hypothyroidism, unspecified: Secondary | ICD-10-CM | POA: Diagnosis not present

## 2022-08-10 DIAGNOSIS — R1013 Epigastric pain: Secondary | ICD-10-CM | POA: Diagnosis not present

## 2022-08-10 DIAGNOSIS — D509 Iron deficiency anemia, unspecified: Secondary | ICD-10-CM | POA: Diagnosis not present

## 2022-08-10 DIAGNOSIS — R519 Headache, unspecified: Secondary | ICD-10-CM | POA: Diagnosis not present

## 2022-08-10 MED ORDER — METOCLOPRAMIDE HCL 10 MG PO TABS
10.0000 mg | ORAL_TABLET | Freq: Two times a day (BID) | ORAL | 1 refills | Status: DC
  Filled 2022-08-10: qty 60, 30d supply, fill #0

## 2022-08-10 MED ORDER — ONDANSETRON HCL 4 MG PO TABS
4.0000 mg | ORAL_TABLET | Freq: Two times a day (BID) | ORAL | 1 refills | Status: DC | PRN
  Filled 2022-08-10: qty 30, 15d supply, fill #0

## 2022-08-11 ENCOUNTER — Other Ambulatory Visit (HOSPITAL_BASED_OUTPATIENT_CLINIC_OR_DEPARTMENT_OTHER): Payer: Self-pay

## 2022-08-11 MED ORDER — LEVOTHYROXINE SODIUM 112 MCG PO TABS
112.0000 ug | ORAL_TABLET | Freq: Every day | ORAL | 3 refills | Status: DC
  Filled 2022-08-11: qty 48, 48d supply, fill #0
  Filled 2022-10-27: qty 48, 48d supply, fill #1
  Filled 2023-01-18: qty 48, 48d supply, fill #2
  Filled 2023-04-17 (×2): qty 48, 48d supply, fill #3

## 2022-08-11 MED ORDER — LEVOTHYROXINE SODIUM 125 MCG PO TABS
125.0000 ug | ORAL_TABLET | Freq: Every morning | ORAL | 3 refills | Status: DC
  Filled 2022-08-11: qty 36, 84d supply, fill #0
  Filled 2022-10-27: qty 36, 84d supply, fill #1
  Filled 2023-01-18: qty 36, 84d supply, fill #2
  Filled 2023-04-17 (×2): qty 36, 84d supply, fill #3

## 2022-08-13 ENCOUNTER — Encounter: Payer: Self-pay | Admitting: Oncology

## 2022-08-14 ENCOUNTER — Other Ambulatory Visit: Payer: Self-pay | Admitting: Oncology

## 2022-08-14 DIAGNOSIS — Z006 Encounter for examination for normal comparison and control in clinical research program: Secondary | ICD-10-CM

## 2022-08-18 ENCOUNTER — Other Ambulatory Visit (HOSPITAL_BASED_OUTPATIENT_CLINIC_OR_DEPARTMENT_OTHER): Payer: Self-pay

## 2022-08-19 ENCOUNTER — Other Ambulatory Visit (HOSPITAL_BASED_OUTPATIENT_CLINIC_OR_DEPARTMENT_OTHER): Payer: Self-pay

## 2022-08-19 MED ORDER — CEQUA 0.09 % OP SOLN
1.0000 [drp] | Freq: Two times a day (BID) | OPHTHALMIC | 3 refills | Status: DC
  Filled 2022-08-25: qty 60, 30d supply, fill #0

## 2022-08-20 DIAGNOSIS — R293 Abnormal posture: Secondary | ICD-10-CM | POA: Diagnosis not present

## 2022-08-20 DIAGNOSIS — M25551 Pain in right hip: Secondary | ICD-10-CM | POA: Diagnosis not present

## 2022-08-20 DIAGNOSIS — M6281 Muscle weakness (generalized): Secondary | ICD-10-CM | POA: Diagnosis not present

## 2022-08-20 DIAGNOSIS — H524 Presbyopia: Secondary | ICD-10-CM | POA: Diagnosis not present

## 2022-08-20 DIAGNOSIS — M542 Cervicalgia: Secondary | ICD-10-CM | POA: Diagnosis not present

## 2022-08-24 ENCOUNTER — Other Ambulatory Visit: Payer: Self-pay | Admitting: Family Medicine

## 2022-08-24 DIAGNOSIS — R519 Headache, unspecified: Secondary | ICD-10-CM

## 2022-08-25 ENCOUNTER — Other Ambulatory Visit (HOSPITAL_BASED_OUTPATIENT_CLINIC_OR_DEPARTMENT_OTHER): Payer: Self-pay

## 2022-08-25 MED ORDER — CYCLOSPORINE 0.05 % OP EMUL
1.0000 [drp] | Freq: Two times a day (BID) | OPHTHALMIC | 3 refills | Status: DC
  Filled 2022-08-25: qty 60, 30d supply, fill #0

## 2022-08-27 ENCOUNTER — Other Ambulatory Visit (HOSPITAL_BASED_OUTPATIENT_CLINIC_OR_DEPARTMENT_OTHER): Payer: Self-pay

## 2022-08-31 ENCOUNTER — Encounter: Payer: Self-pay | Admitting: Oncology

## 2022-09-03 ENCOUNTER — Encounter: Payer: Self-pay | Admitting: Oncology

## 2022-09-03 ENCOUNTER — Encounter: Payer: Self-pay | Admitting: Family Medicine

## 2022-09-07 ENCOUNTER — Ambulatory Visit
Admission: RE | Admit: 2022-09-07 | Discharge: 2022-09-07 | Disposition: A | Discharge status: Home / Self Care | Payer: Commercial Managed Care - PPO | Source: Ambulatory Visit | Attending: Family Medicine | Admitting: Family Medicine

## 2022-09-07 DIAGNOSIS — R519 Headache, unspecified: Secondary | ICD-10-CM | POA: Diagnosis not present

## 2022-09-07 MED ORDER — GADOPICLENOL 0.5 MMOL/ML IV SOLN
5.0000 mL | Freq: Once | INTRAVENOUS | Status: AC | PRN
  Administered 2022-09-07: 5 mL via INTRAVENOUS

## 2022-09-09 DIAGNOSIS — M542 Cervicalgia: Secondary | ICD-10-CM | POA: Diagnosis not present

## 2022-09-09 DIAGNOSIS — M6281 Muscle weakness (generalized): Secondary | ICD-10-CM | POA: Diagnosis not present

## 2022-09-09 DIAGNOSIS — M25551 Pain in right hip: Secondary | ICD-10-CM | POA: Diagnosis not present

## 2022-09-09 DIAGNOSIS — R293 Abnormal posture: Secondary | ICD-10-CM | POA: Diagnosis not present

## 2022-09-21 ENCOUNTER — Other Ambulatory Visit (HOSPITAL_BASED_OUTPATIENT_CLINIC_OR_DEPARTMENT_OTHER): Payer: Self-pay

## 2022-09-21 MED ORDER — CLONAZEPAM 0.5 MG PO TABS
0.2500 mg | ORAL_TABLET | Freq: Two times a day (BID) | ORAL | 1 refills | Status: DC | PRN
  Filled 2022-09-21: qty 30, 15d supply, fill #0
  Filled 2023-03-15: qty 30, 15d supply, fill #1

## 2022-10-06 DIAGNOSIS — M542 Cervicalgia: Secondary | ICD-10-CM | POA: Diagnosis not present

## 2022-10-06 DIAGNOSIS — M25551 Pain in right hip: Secondary | ICD-10-CM | POA: Diagnosis not present

## 2022-10-06 DIAGNOSIS — M6281 Muscle weakness (generalized): Secondary | ICD-10-CM | POA: Diagnosis not present

## 2022-10-06 DIAGNOSIS — R293 Abnormal posture: Secondary | ICD-10-CM | POA: Diagnosis not present

## 2022-10-06 NOTE — Progress Notes (Signed)
Office Visit Note  Patient: Marilyn Collins             Date of Birth: 02-02-1956           MRN: [id number]             PCP: Verl Blalock Referring: [facility]* Visit Date: 10/19/2022 Occupation: @GUAROCC @  Subjective:  Right shoulder and muscle pain  History of Present Illness: Marilyn Collins is a 67 y.o. female with history of sicca symptoms, osteoarthritis, degenerative disc disease and myofascial pain syndrome.  She states for the last couple of months she has been having discomfort in her right shoulder.  She has no difficulty raising her arm but she has comfort when she sleeps on her right side and also touches that area.  She continues to have some trapezius muscle spasm in her neck.  She has been going to integrative therapies which has been helpful.  She continues to have some lower back pain.  She is still has SI joint pain for which she goes to Dr. Alvester Morin.  She continues to have discomfort in her bilateral CMC joints.  It is difficult for her to wear CMC braces at work as she has to wash her hands frequently.  She has intermittent triggering of her left middle finger.  Knee joint and pain in her feet is tolerable currently.  She continues to exercise on a regular basis.    Activities of Daily Living:  Patient reports morning stiffness for 10 minutes.   Patient Denies nocturnal pain.  Difficulty dressing/grooming: Denies Difficulty climbing stairs: Denies Difficulty getting out of chair: Denies Difficulty using hands for taps, buttons, cutlery, and/or writing: Denies  Review of Systems  Constitutional:  Positive for fatigue.  HENT:  Positive for mouth dryness. Negative for mouth sores.   Eyes:  Positive for dryness.  Respiratory:  Negative for shortness of breath.   Cardiovascular:  Positive for palpitations. Negative for chest pain.  Gastrointestinal:  Positive for constipation. Negative for blood in stool and diarrhea.  Endocrine: Negative for increased  urination.  Genitourinary:  Negative for involuntary urination.  Musculoskeletal:  Positive for joint pain, joint pain, joint swelling, muscle weakness, morning stiffness and muscle tenderness. Negative for gait problem, myalgias and myalgias.  Skin:  Negative for color change, rash, hair loss and sensitivity to sunlight.  Allergic/Immunologic: Negative for susceptible to infections.  Neurological:  Positive for headaches. Negative for dizziness.  Hematological:  Negative for swollen glands.  Psychiatric/Behavioral:  Negative for depressed mood and sleep disturbance. The patient is nervous/anxious.     PMFS History:  Patient Active Problem List   Diagnosis Date Noted   Migraine without aura and without status migrainosus, not intractable 04/17/2022   Muscle spasm 04/17/2022   Palpitations 11/24/2021   Aortic atherosclerosis (HCC) 11/24/2021   DDD (degenerative disc disease), lumbar 11/19/2020   Thoracic disc disease 04/19/2019   Myofascial pain syndrome 04/19/2019   Idiopathic peripheral neuropathy 02/07/2019   Acquired hypothyroidism 02/03/2018   Hormone replacement therapy (HRT) 02/03/2018   Underweight 02/03/2018   GERD (gastroesophageal reflux disease)    Postural kyphosis of thoracic region 03/16/2017   Neck pain 11/20/2016   Chronic right-sided thoracic back pain 08/03/2016   Celiac disease 07/20/2011   Dietary iron deficiency - malabsorption 07/20/2011   Chronic idiopathic monocytosis 07/20/2011    Past Medical History:  Diagnosis Date   Acquired hypothyroidism 02/03/2018   Anxiety    Aortic atherosclerosis (HCC) 11/24/2021  Arthritis    Celiac disease 07/20/2011   Chronic idiopathic monocytosis 07/20/2011   Dysphagia 12/2012   responsive to dexilant   GERD (gastroesophageal reflux disease)    H/O eating disorder    Hypothyroidism    Iron deficiency anemia 07/20/2011   Iron deficiency anemia, unspecified 07/20/2011   Migraine headache    Other specified intestinal  malabsorption 07/20/2011   Palpitations 11/24/2021    Family History  Problem Relation Age of Onset   Cancer Mother    Heart disease Mother    Hyperlipidemia Father    Hypertension Father    Stroke Father    CAD Father    Hypertension Brother    Cancer Maternal Grandmother    Stroke Maternal Grandfather    Heart disease Paternal Grandmother    CAD Paternal Grandfather    Anxiety disorder Son    Bipolar disorder Son    Past Surgical History:  Procedure Laterality Date   ABDOMINAL HYSTERECTOMY     COLONOSCOPY  06/2003   neg   DIAGNOSTIC LAPAROSCOPY     ESOPHAGOGASTRODUODENOSCOPY N/A 10/20/2012   Procedure: ESOPHAGOGASTRODUODENOSCOPY (EGD);  Surgeon: Florencia Reasons, MD;  Location: [facility];  Service: Endoscopy;  Laterality: N/A;   FLEXIBLE SIGMOIDOSCOPY  2005   SINUS IRRIGATION     TMJ ARTHROPLASTY     Social History   Social History Narrative   RN at [employer]   Married   2 boys   Immunization History  Administered Date(s) Administered   Influenza-Unspecified 10/19/2016, 10/21/2018   Pfizer Covid-19 Vaccine Bivalent Booster 95yrs & up 01/02/2021   Td 02/26/2009   Tdap 10/03/2016   Zoster Recombinant(Shingrix) 02/03/2018, 08/16/2018     Objective: Vital Signs: BP 105/69 (BP Location: Left Arm, Patient Position: Sitting, Cuff Size: Normal)   Pulse 63   Resp 14   Ht 5\' 7"  (1.702 m)   Wt 94 lb (42.6 kg)   BMI 14.72 kg/m    Physical Exam Vitals and nursing note reviewed.  Constitutional:      Appearance: She is well-developed.  HENT:     Head: Normocephalic and atraumatic.  Eyes:     Conjunctiva/sclera: Conjunctivae normal.  Cardiovascular:     Rate and Rhythm: Normal rate and regular rhythm.     Heart sounds: Normal heart sounds.  Pulmonary:     Effort: Pulmonary effort is normal.     Breath sounds: Normal breath sounds.  Abdominal:     General: Bowel sounds are normal.     Palpations: Abdomen is soft.  Musculoskeletal:     Cervical back:  Normal range of motion.  Lymphadenopathy:     Cervical: No cervical adenopathy.  Skin:    General: Skin is warm and dry.     Capillary Refill: Capillary refill takes less than 2 seconds.  Neurological:     Mental Status: She is alert and oriented to person, place, and time.  Psychiatric:        Behavior: Behavior normal.      Musculoskeletal Exam: Cervical spine was in good range of motion.  She had bilateral trapezius spasm.  She had tenderness over the right acromion but no discomfort with range of motion of her right shoulder.  There was no tenderness over subacromial region.  Left shoulder joint was in full range of motion.  Elbow joints, wrist joints were in good range of motion without any synovitis.  She had bilateral CMC thickening with tenderness.  There was PIP and DIP thickening.  She  had tenderness over left third PIP joint.  She gives history of intermittent triggering of her left middle finger.  Hip joints and knee joints were in good range of motion without any warmth swelling or effusion.  There was no tenderness over ankles or MTPs.  CDAI Exam: CDAI Score: -- Patient Global: --; Provider Global: -- Swollen: --; Tender: -- Joint Exam 10/19/2022   No joint exam has been documented for this visit   There is currently no information documented on the homunculus. Go to the Rheumatology activity and complete the homunculus joint exam.  Investigation: No additional findings.  Imaging: No results found.  Recent Labs: Lab Results  Component Value Date   WBC 5.3 11/25/2021   HGB 13.1 11/25/2021   PLT 261 11/25/2021   NA 138 02/03/2018   K 4.2 02/03/2018   CL 103 02/03/2018   CO2 29 02/03/2018   GLUCOSE 77 02/03/2018   BUN 18 02/03/2018   CREATININE 0.75 02/03/2018   BILITOT 0.5 07/14/2022   ALKPHOS 59 07/14/2022   AST 23 07/14/2022   ALT 23 07/14/2022   PROT 6.5 07/14/2022   ALBUMIN 3.9 07/14/2022   CALCIUM 9.7 02/03/2018    Speciality Comments: No  specialty comments available.  Procedures:  No procedures performed Allergies: Morphine and codeine and Venofer [ferric oxide]   Assessment / Plan:     Visit Diagnoses: Sicca complex (HCC) - SSA negative, SSB negative, RF negative.  She continues to have dry mouth and dry eye symptoms.  Over-the-counter products were discussed at length.  Handout was provided.  Chronic right shoulder pain-she has been experiencing discomfort in the right shoulder when she sleeps on her right side.  She complains of tenderness over her right acromion.  She does not have much fat pad has she is underweight.  I advised her to avoid sleeping on the right side.  She did not have any tenderness over the subacromial region.  Her shoulder joint was in full range of motion without discomfort.  Primary osteoarthritis of both hands - Bilateral CMC arthritis.  She is unable to use CMC brace at work.  Use of Voltaren gel at nighttime was advised.  Compression gloves and hand massager was advised.  Trigger finger, left middle finger-she has intermittent triggering.  She had no tenderness on palpation today.  Chronic right SI joint pain - Mild osteoarthritic changes were noted on the x-rays in October 2022. She had bilateral SI joint cortisone injections by Dr. Alvester Morin on December 17, 2021.  Primary osteoarthritis of both feet-she denies discomfort today.  DDD (degenerative disc disease), cervical-she continues to have some stiffness in her cervical region.  Postural kyphosis of thoracic region  DDD (degenerative disc disease), thoracic-doing well currently.  DDD (degenerative disc disease), lumbar-followed by Dr. Alvester Morin.  Myofascial pain syndrome - baclofen 5 mg p.o. twice daily she as needed and gabapentin 200 p.o. nightly.  She goes to integrative therapies.  She is doing better overall since she has been going to integrative therapies.  Need for regular exercise and stretching was emphasized.  Patient continues to  exercise on a regular basis.  Osteopenia of multiple sites - October 13, 2021 the BMD measured at [facility] Neck Right is 0.779 g/cm2 with aT-score of -1.9.  Calcium rich diet and vitamin D was advised.  I will check repeat DEXA scan in September 2025.  Other medical problems are listed as follows:  Celiac disease  History of gastroesophageal reflux (GERD)  Dietary iron deficiency - malabsorption  Idiopathic peripheral neuropathy  Chronic idiopathic monocytosis  Acquired hypothyroidism  Family history of rheumatoid arthritis  Orders: No orders of the defined types were placed in this encounter.  No orders of the defined types were placed in this encounter.    Follow-Up Instructions: Return in about 1 year (around 10/19/2023) for Osteoarthritis.   Pollyann Savoy, MD  Note - This record has been created using Animal nutritionist.  Chart creation errors have been sought, but may not always  have been located. Such creation errors do not reflect on  the standard of medical care.

## 2022-10-12 ENCOUNTER — Other Ambulatory Visit (HOSPITAL_BASED_OUTPATIENT_CLINIC_OR_DEPARTMENT_OTHER): Payer: Self-pay

## 2022-10-13 ENCOUNTER — Other Ambulatory Visit (HOSPITAL_BASED_OUTPATIENT_CLINIC_OR_DEPARTMENT_OTHER): Payer: Self-pay

## 2022-10-14 ENCOUNTER — Other Ambulatory Visit: Payer: Self-pay

## 2022-10-14 ENCOUNTER — Other Ambulatory Visit (HOSPITAL_BASED_OUTPATIENT_CLINIC_OR_DEPARTMENT_OTHER): Payer: Self-pay

## 2022-10-14 MED ORDER — RESTASIS 0.05 % OP EMUL
1.0000 [drp] | Freq: Two times a day (BID) | OPHTHALMIC | 3 refills | Status: DC
  Filled 2022-10-14: qty 180, 90d supply, fill #0
  Filled 2023-07-01: qty 180, 90d supply, fill #1

## 2022-10-15 ENCOUNTER — Other Ambulatory Visit (HOSPITAL_BASED_OUTPATIENT_CLINIC_OR_DEPARTMENT_OTHER): Payer: Self-pay

## 2022-10-19 ENCOUNTER — Ambulatory Visit: Payer: Medicare Other | Attending: Rheumatology | Admitting: Rheumatology

## 2022-10-19 ENCOUNTER — Encounter: Payer: Self-pay | Admitting: Rheumatology

## 2022-10-19 VITALS — BP 105/69 | HR 63 | Resp 14 | Ht 67.0 in | Wt 94.0 lb

## 2022-10-19 DIAGNOSIS — M19041 Primary osteoarthritis, right hand: Secondary | ICD-10-CM | POA: Diagnosis not present

## 2022-10-19 DIAGNOSIS — M51369 Other intervertebral disc degeneration, lumbar region without mention of lumbar back pain or lower extremity pain: Secondary | ICD-10-CM

## 2022-10-19 DIAGNOSIS — E611 Iron deficiency: Secondary | ICD-10-CM

## 2022-10-19 DIAGNOSIS — E039 Hypothyroidism, unspecified: Secondary | ICD-10-CM

## 2022-10-19 DIAGNOSIS — M25511 Pain in right shoulder: Secondary | ICD-10-CM

## 2022-10-19 DIAGNOSIS — M4004 Postural kyphosis, thoracic region: Secondary | ICD-10-CM

## 2022-10-19 DIAGNOSIS — Z8261 Family history of arthritis: Secondary | ICD-10-CM

## 2022-10-19 DIAGNOSIS — Z8719 Personal history of other diseases of the digestive system: Secondary | ICD-10-CM

## 2022-10-19 DIAGNOSIS — M65342 Trigger finger, left ring finger: Secondary | ICD-10-CM

## 2022-10-19 DIAGNOSIS — M5134 Other intervertebral disc degeneration, thoracic region: Secondary | ICD-10-CM

## 2022-10-19 DIAGNOSIS — M3500 Sicca syndrome, unspecified: Secondary | ICD-10-CM

## 2022-10-19 DIAGNOSIS — M19072 Primary osteoarthritis, left ankle and foot: Secondary | ICD-10-CM

## 2022-10-19 DIAGNOSIS — G609 Hereditary and idiopathic neuropathy, unspecified: Secondary | ICD-10-CM

## 2022-10-19 DIAGNOSIS — M65332 Trigger finger, left middle finger: Secondary | ICD-10-CM

## 2022-10-19 DIAGNOSIS — M19071 Primary osteoarthritis, right ankle and foot: Secondary | ICD-10-CM

## 2022-10-19 DIAGNOSIS — K900 Celiac disease: Secondary | ICD-10-CM

## 2022-10-19 DIAGNOSIS — M5136 Other intervertebral disc degeneration, lumbar region: Secondary | ICD-10-CM

## 2022-10-19 DIAGNOSIS — M8589 Other specified disorders of bone density and structure, multiple sites: Secondary | ICD-10-CM

## 2022-10-19 DIAGNOSIS — G8929 Other chronic pain: Secondary | ICD-10-CM

## 2022-10-19 DIAGNOSIS — M5030 Other cervical disc degeneration, unspecified cervical region: Secondary | ICD-10-CM

## 2022-10-19 DIAGNOSIS — M533 Sacrococcygeal disorders, not elsewhere classified: Secondary | ICD-10-CM

## 2022-10-19 DIAGNOSIS — M19042 Primary osteoarthritis, left hand: Secondary | ICD-10-CM

## 2022-10-19 DIAGNOSIS — D72821 Monocytosis (symptomatic): Secondary | ICD-10-CM

## 2022-10-19 DIAGNOSIS — M7918 Myalgia, other site: Secondary | ICD-10-CM

## 2022-10-21 ENCOUNTER — Encounter: Payer: Self-pay | Admitting: Physical Medicine and Rehabilitation

## 2022-10-21 ENCOUNTER — Other Ambulatory Visit: Payer: Self-pay | Admitting: Physical Medicine and Rehabilitation

## 2022-10-21 DIAGNOSIS — M461 Sacroiliitis, not elsewhere classified: Secondary | ICD-10-CM

## 2022-10-27 ENCOUNTER — Other Ambulatory Visit: Payer: Self-pay

## 2022-11-09 ENCOUNTER — Other Ambulatory Visit: Payer: Self-pay

## 2022-11-09 ENCOUNTER — Ambulatory Visit: Payer: Medicare Other | Admitting: Physical Medicine and Rehabilitation

## 2022-11-09 DIAGNOSIS — M461 Sacroiliitis, not elsewhere classified: Secondary | ICD-10-CM | POA: Diagnosis not present

## 2022-11-09 NOTE — Progress Notes (Signed)
Functional Pain Scale - descriptive words and definitions  Uncomfortable (3)  Pain is present but can complete all ADL's/sleep is slightly affected and passive distraction only gives marginal relief. Mild range order  Average Pain  varies   +Driver, -BT, -Dye Allergies.  Lower back pain on both sides with radiation in the buttocks and back of the legs

## 2022-11-09 NOTE — Progress Notes (Signed)
Marilyn Collins - 67 y.o. female MRN [id number]  Date of birth: 29-Nov-1955  Office Visit Note: Visit Date: 11/09/2022 PCP: Sheliah Hatch, PA-C Referred by: Verl Blalock  Subjective: Chief Complaint  Patient presents with   Lower Back - Pain   HPI:  Marilyn Collins is a 67 y.o. female who comes in today at the request of Ellin Goodie, FNP for planned Bilateral anesthetic Sacroiliac joint arthrogram with fluoroscopic guidance.  The patient has failed conservative care including home exercise, medications, time and activity modification.  This injection will be diagnostic and hopefully therapeutic.  Please see requesting physician notes for further details and justification.   Positive Fortin finger sign, Patrick's testing, Gaenslen's  and lateral compression test.     ROS Otherwise per HPI.  Assessment & Plan: Visit Diagnoses:    ICD-10-CM   1. Sacroiliitis (HCC)  M46.1 Sacroiliac Joint Inj    XR C-ARM NO REPORT      Plan: No additional findings.   Meds & Orders: No orders of the defined types were placed in this encounter.   Orders Placed This Encounter  Procedures   Sacroiliac Joint Inj   XR C-ARM NO REPORT    Follow-up: Return if symptoms worsen or fail to improve.   Procedures: Sacroiliac Joint Inj on 11/09/2022 2:28 PM Indications: pain and diagnostic evaluation Details: 22 G 3.5 in needle, fluoroscopy-guided posterior approach Medications (Right): 40 mg triamcinolone acetonide 40 MG/ML Medications (Left): 40 mg triamcinolone acetonide 40 MG/ML Outcome: tolerated well, no immediate complications  Sacroiliac Joint Intra-Articular Injection - Posterior Approach with Fluoroscopic Guidance   Position: PRONE  Additional Comments: Vital signs were monitored before and after the procedure. Patient was prepped and draped in the usual sterile fashion. The correct patient, procedure, and site was verified.   Injection Procedure Details:   Location/Site:   Sacroiliac joint  Needle size: 3.5 in Spinal Needle  Needle type: Spinal  Needle Placement: Intra-articular  Findings:  -Comments: There was excellent flow of contrast producing a partial arthrogram of the sacroiliac joint.   Procedure Details: Starting with a 90 degree vertical and midline orientation the fluoroscope was tilted cranially 20 to 25 degrees and the target area of the inferior most part of the SI joint on the side mentioned above was visualized.  The soft tissues overlying this target were infiltrated with 4 ml. of 1% Lidocaine without Epinephrine. A #22 gauge spinal needle was inserted perpendicular to the fluoroscope table and advanced into the posterior inferior joint space using fluoroscopic guidance.  Position in the joint space was confirmed by obtaining a partial arthrogram using a 2 ml. volume of Isovue-250 contrast agent. After negative aspirate for gross pus or blood, the injectate was delivered to the joint. Radiographs were obtained for documentation purposes.   Additional Comments:   Dressing: Bandaid    Post-procedure details: Patient was observed during the procedure. Post-procedure instructions were reviewed.  Patient left the clinic in stable condition.    There was excellent flow of contrast producing a partial arthrogram of the sacroiliac joint.  Procedure, treatment alternatives, risks and benefits explained, specific risks discussed. Consent was given by the patient. Immediately prior to procedure a time out was called to verify the correct patient, procedure, equipment, support staff and site/side marked as required. Patient was prepped and draped in the usual sterile fashion.          Clinical History: XR Pelvic 1-2 Views 11/19/2020  No SI joint sclerosis  or narrowing was noted.  Some osteoarthritic changes  were noted in the SI joints.  No hip joint narrowing was noted.   Impression: These findings are consistent with mild  osteoarthritic changes  of the SI joints. -------------------------------- MRI LUMBAR SPINE WITHOUT CONTRAST  02/17/2011  Technique:  Multiplanar and multiecho pulse sequences of the lumbar  spine were obtained without intravenous contrast.   Comparison: Lumbar MRI 03/06/2004.   Findings: Five lumbar type vertebral bodies are assumed.  The  alignment is stable.  Scattered hemangiomas are stable, most  prominent at T11.  Compared with the prior examinations, the  patient has developed diffusely decreased marrow signal throughout  the spine. There are no new focal marrow lesions.  In addition, the  liver demonstrates diffusely decreased T2 signal.  The spleen is  not imaged.   The conus medullaris extends to the L1-L2 level and appears normal.  Bilateral sacral Tarlov cysts are stable.  There are no paraspinal  abnormalities.   Based on the sagittal images, mild disc bulging at T10-T11 appears  stable.   There are no significant disc space findings from T11-T12 through  L2-L3.   L3-L4:  Disc height and hydration are maintained.  There is mild  bilateral facet hypertrophy without resulting spinal stenosis or  nerve root encroachment.   L4-L5:  Disc desiccation and a small posterolateral disc protrusion  on the right are chronic and unchanged.  There is mild bilateral  facet hypertrophy.  No foraminal compromise or nerve root  encroachment is present.   L5-S1:  Disc height and hydration are maintained.  There is mild  bilateral facet hypertrophy without resulting foraminal compromise  or nerve root encroachment.   IMPRESSION:   1.  Stable small posterolateral disc protrusion on the right at L4-  L5.  No resulting spinal stenosis or nerve root encroachment.  2.  Mild facet disease inferiorly.  3.  No acute disc space findings, significant spinal stenosis or  nerve root encroachment.  4.  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 Correlate clinically.     Objective:  VS:  HT:    WT:   BMI:     BP:   HR: bpm  TEMP: ( )  RESP:  Physical Exam   Imaging: No results found.

## 2022-11-16 ENCOUNTER — Encounter: Payer: Self-pay | Admitting: Neurology

## 2022-11-16 ENCOUNTER — Ambulatory Visit: Payer: Medicare Other | Admitting: Neurology

## 2022-11-16 VITALS — BP 130/82 | HR 72 | Ht 67.0 in | Wt 94.0 lb

## 2022-11-16 DIAGNOSIS — G43709 Chronic migraine without aura, not intractable, without status migrainosus: Secondary | ICD-10-CM | POA: Diagnosis not present

## 2022-11-16 NOTE — Progress Notes (Signed)
Chief Complaint  Patient presents with   New Patient (Initial Visit)    Rm 14. Patient alone, Patient reports headaches are getting better, since appointment being made.       ASSESSMENT AND PLAN  Marilyn Collins is a 67 y.o. female   Chronic migraine headache  Long history,  Normal MRI of the brain,  Imitrex as needed, may combine Imitrex with Zofran, muscle relaxant, even Benadryl for prolonged severe headaches  Only return to clinic  for new issues  DIAGNOSTIC DATA (LABS, IMAGING, TESTING) - I reviewed patient records, labs, notes, testing and imaging myself where available.   MEDICAL HISTORY:  Marilyn Collins, is a 67 year old female seen in request by her primary care from [facility] [place] for evaluation of frequent headaches, initial evaluation November 16, 2022  History is obtained from the patient and review of electronic medical records. I personally reviewed pertinent available imaging films in PACS.   PMHx of  Celiac disease. 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, for a while, she can have a headache lasting for 1 to 2 weeks, hard to break the headache cycle, Imitrex as needed was helpful, she can also have weeks months without frequent headaches  Trigger for her headache are missing the meal, sleep deprivation, weather change  MRI of the brain with without contrast in August 2024 was normal  Laboratory evaluation in 2024 showed normal CMP, CBC hemoglobin of 12.4, vitamin B12 288  PHYSICAL EXAM:      11/16/2022    2:12 PM 10/19/2022   11:03 AM 04/17/2022    8:06 AM  Vitals with BMI  Height 5\' 7"  5\' 7"    Weight 94 lbs 94 lbs 94 lbs  BMI 14.72 14.72 14.72  Systolic  130 105 117  Diastolic 82 69 76  Pulse 72 63 62    PHYSICAL EXAMNIATION:  Gen: NAD, conversant, well nourised, well groomed                     Cardiovascular: Regular rate rhythm, no peripheral edema, warm, nontender. Eyes: Conjunctivae clear without exudates or hemorrhage Neck: Supple, no carotid bruits. Pulmonary: Clear to auscultation bilaterally   NEUROLOGICAL EXAM:  MENTAL STATUS: Speech/cognition: Very thin, awake, alert, oriented to history taking and casual conversation CRANIAL NERVES: CN II: Visual fields are full to confrontation. Pupils are round equal and briskly reactive to light. CN III, IV, VI: extraocular movement are normal. No ptosis. CN V: Facial sensation is intact to light touch CN VII: Face is symmetric with normal eye closure  CN VIII: Hearing is normal to causal conversation. CN IX, X: Phonation is normal. CN XI: Head turning and shoulder shrug are intact  MOTOR: There is no pronator drift of out-stretched arms. Muscle bulk and tone are normal. Muscle strength is normal.  REFLEXES: Reflexes are 2+ and symmetric at the biceps, triceps, knees, and ankles. Plantar responses are flexor.  SENSORY: Intact to light touch, pinprick and vibratory sensation are intact in fingers and toes.  COORDINATION: There is no trunk or limb dysmetria noted.  GAIT/STANCE: Posture is normal. Gait is steady with normal steps, base, arm swing, and turning. Heel and toe walking are  normal. Tandem gait is normal.  Romberg is absent.  REVIEW OF SYSTEMS:  Full 14 system review of systems performed and notable only for as above All other review of systems were negative.   ALLERGIES: Allergies  Allergen Reactions   Morphine And Codeine Nausea And Vomiting   Venofer [Ferric Oxide] Anaphylaxis    HOME MEDICATIONS: Current Outpatient Medications  Medication Sig Dispense Refill   Acetaminophen (TYLENOL ARTHRITIS PAIN PO) Take by mouth at bedtime.     baclofen (LIORESAL)  10 MG tablet Take 0.5 tablet (5 mg dose) by mouth twice daily as needed. 30 tablet 0   bifidobacterium infantis (ALIGN) capsule Take 1 capsule by mouth daily.     CALCIUM-VITAMIN D PO Take by mouth.     clonazePAM (KLONOPIN) 0.5 MG tablet 1/2 to 1 tablet Orally Twice a day as needed 30 days (Patient taking differently: 1/4 tab daily at bedtime) 60 tablet 1   diclofenac Sodium (VOLTAREN) 1 % GEL Apply topically as needed.     estradiol (VIVELLE-DOT) 0.1 MG/24HR patch Place 1 patch on the skin two times each week. 24 patch 0   gabapentin (NEURONTIN) 100 MG capsule Take 1 capsule (100 mg total) by mouth 3 (three) times daily. 270 capsule 1   levothyroxine (SYNTHROID) 112 MCG tablet Take 1 tablet (112 mcg total) by mouth daily on an empty stomach 4 days per week 48 tablet 3   levothyroxine (SYNTHROID) 125 MCG tablet Take 1 tablet (125 mcg total) by mouth in the morning on an empty stomach 3 times per week 36 tablet 3   Methylcellulose, Laxative, (CITRUCEL PO) Take 1 scoop by mouth daily.     metoCLOPramide (REGLAN) 10 MG tablet Take 1 tablet (10 mg total) by mouth 2 (two) times daily before a meal. 60 tablet 1   Multiple Vitamin (MULTIVITAMIN PO) Take by mouth daily.     omeprazole (PRILOSEC) 40 MG capsule Take 1 capsule by mouth daily 30 minutes before morning meal 90 capsule 2   ondansetron (ZOFRAN) 4 MG tablet Take 1 tablet (4 mg total) by mouth 2 (two) times daily as needed. 30 tablet 1   RESTASIS 0.05 % ophthalmic emulsion Instill 1 drop into affected eye(s) every 12 (twelve) hours. 180 each 3   SUMAtriptan (IMITREX) 50 MG tablet Take 1 tablet at onset of migraine, wait at least 2 hours between doses as needed. 27 tablet 1   clonazePAM (KLONOPIN) 0.5 MG tablet Take 0.5-1 tablets (0.25-0.5 mg total) by mouth 2 (two) times daily as needed. (Patient not taking: Reported on 10/19/2022) 30 tablet 1   No current facility-administered medications for this visit.    PAST MEDICAL HISTORY: Past Medical  History:  Diagnosis Date   Acquired hypothyroidism 02/03/2018   Anxiety    Aortic atherosclerosis (HCC) 11/24/2021   Arthritis    Celiac disease 07/20/2011   Chronic idiopathic monocytosis 07/20/2011   Dysphagia 12/2012   responsive to dexilant   GERD (gastroesophageal reflux disease)    H/O eating disorder    Hypothyroidism    Iron deficiency anemia 07/20/2011   Iron deficiency anemia, unspecified 07/20/2011   Migraine headache    Other specified intestinal malabsorption 07/20/2011   Palpitations 11/24/2021    PAST SURGICAL HISTORY: Past Surgical History:  Procedure Laterality Date   ABDOMINAL HYSTERECTOMY     COLONOSCOPY  06/2003   neg   DIAGNOSTIC LAPAROSCOPY     ESOPHAGOGASTRODUODENOSCOPY N/A 10/20/2012   Procedure: ESOPHAGOGASTRODUODENOSCOPY (EGD);  Surgeon: Florencia Reasons, MD;  Location: [facility];  Service: Endoscopy;  Laterality: N/A;   FLEXIBLE SIGMOIDOSCOPY  2005   SINUS IRRIGATION     TMJ ARTHROPLASTY      FAMILY HISTORY: Family History  Problem Relation Age of Onset   Cancer Mother    Heart disease Mother    Hyperlipidemia Father    Hypertension Father    Stroke Father    CAD Father    Hypertension Brother    Cancer Maternal Grandmother    Stroke Maternal Grandfather    Heart disease Paternal Grandmother    CAD Paternal Grandfather    Anxiety disorder Son    Bipolar disorder Son     SOCIAL HISTORY: Social History   Socioeconomic History   Marital status: Married    Spouse name: Not on file   Number of children: 2   Years of education: Not on file   Highest education level: Not on file  Occupational History   Occupation: [employer] Teacher, adult education: [employer]  Tobacco Use   Smoking status: Never    Passive exposure: Never   Smokeless tobacco: Never  Vaping Use   Vaping status: Never Used  Substance and Sexual Activity   Alcohol use: No    Alcohol/week: 0.0 standard drinks of alcohol   Drug use: No   Sexual activity: Yes    Birth  control/protection: Surgical  Other Topics Concern   Not on file  Social History Narrative   RN at [employer]   Married   2 boys   Social Determinants of Health   Financial Resource Strain: Not on file  Food Insecurity: Not on file  Transportation Needs: Not on file  Physical Activity: Not on file  Stress: Not on file  Social Connections: Not on file  Intimate Partner Violence: Not on file      Levert Feinstein, M.D. Ph.D.  [facility] [address] [place], [place] [address] Ph: [phone] Fax: [phone]  CC:  Verl Blalock [address]  [place] [address]  Sheliah Hatch, [place]

## 2022-11-17 MED ORDER — TRIAMCINOLONE ACETONIDE 40 MG/ML IJ SUSP
40.0000 mg | INTRAMUSCULAR | Status: AC | PRN
Start: 2022-11-09 — End: 2022-11-09
  Administered 2022-11-09: 40 mg via INTRA_ARTICULAR

## 2022-11-23 ENCOUNTER — Other Ambulatory Visit (HOSPITAL_BASED_OUTPATIENT_CLINIC_OR_DEPARTMENT_OTHER): Payer: Self-pay

## 2022-11-23 ENCOUNTER — Encounter: Payer: Self-pay | Admitting: Oncology

## 2022-11-23 MED ORDER — INFLUENZA VAC A&B SURF ANT ADJ 0.5 ML IM SUSY
0.5000 mL | PREFILLED_SYRINGE | Freq: Once | INTRAMUSCULAR | 0 refills | Status: AC
  Filled 2022-11-23: qty 0.5, 1d supply, fill #0

## 2022-11-30 DIAGNOSIS — E039 Hypothyroidism, unspecified: Secondary | ICD-10-CM | POA: Diagnosis not present

## 2022-11-30 DIAGNOSIS — E785 Hyperlipidemia, unspecified: Secondary | ICD-10-CM | POA: Diagnosis not present

## 2022-11-30 DIAGNOSIS — D509 Iron deficiency anemia, unspecified: Secondary | ICD-10-CM | POA: Diagnosis not present

## 2022-11-30 DIAGNOSIS — Z0000 Encounter for general adult medical examination without abnormal findings: Secondary | ICD-10-CM | POA: Diagnosis not present

## 2022-12-07 DIAGNOSIS — K080 Exfoliation of teeth due to systemic causes: Secondary | ICD-10-CM | POA: Diagnosis not present

## 2022-12-10 ENCOUNTER — Other Ambulatory Visit: Payer: Self-pay | Admitting: Family Medicine

## 2022-12-10 DIAGNOSIS — Z1231 Encounter for screening mammogram for malignant neoplasm of breast: Secondary | ICD-10-CM

## 2022-12-11 ENCOUNTER — Encounter (HOSPITAL_BASED_OUTPATIENT_CLINIC_OR_DEPARTMENT_OTHER): Payer: Self-pay | Admitting: Radiology

## 2022-12-11 ENCOUNTER — Ambulatory Visit (HOSPITAL_BASED_OUTPATIENT_CLINIC_OR_DEPARTMENT_OTHER)
Admission: RE | Admit: 2022-12-11 | Discharge: 2022-12-11 | Disposition: A | Discharge status: Home / Self Care | Payer: Medicare Other | Source: Ambulatory Visit | Attending: Family Medicine | Admitting: Family Medicine

## 2022-12-11 ENCOUNTER — Other Ambulatory Visit (HOSPITAL_BASED_OUTPATIENT_CLINIC_OR_DEPARTMENT_OTHER): Payer: Self-pay

## 2022-12-11 ENCOUNTER — Encounter: Payer: Self-pay | Admitting: Oncology

## 2022-12-11 ENCOUNTER — Other Ambulatory Visit: Payer: Self-pay

## 2022-12-11 ENCOUNTER — Ambulatory Visit: Payer: Commercial Managed Care - PPO | Admitting: Internal Medicine

## 2022-12-11 DIAGNOSIS — Z1231 Encounter for screening mammogram for malignant neoplasm of breast: Secondary | ICD-10-CM | POA: Insufficient documentation

## 2022-12-11 MED ORDER — ESTRADIOL 0.1 MG/24HR TD PTTW
1.0000 | MEDICATED_PATCH | TRANSDERMAL | 1 refills | Status: AC
  Filled 2022-12-11: qty 24, 84d supply, fill #0
  Filled 2023-06-09: qty 24, 84d supply, fill #1

## 2022-12-14 ENCOUNTER — Ambulatory Visit: Payer: Medicare Other | Admitting: Internal Medicine

## 2022-12-14 ENCOUNTER — Other Ambulatory Visit (HOSPITAL_BASED_OUTPATIENT_CLINIC_OR_DEPARTMENT_OTHER): Payer: Self-pay

## 2022-12-14 ENCOUNTER — Encounter: Payer: Self-pay | Admitting: Internal Medicine

## 2022-12-14 VITALS — BP 118/68 | HR 68 | Ht 67.0 in | Wt 93.0 lb

## 2022-12-14 DIAGNOSIS — K5909 Other constipation: Secondary | ICD-10-CM

## 2022-12-14 DIAGNOSIS — Z1211 Encounter for screening for malignant neoplasm of colon: Secondary | ICD-10-CM

## 2022-12-14 DIAGNOSIS — K219 Gastro-esophageal reflux disease without esophagitis: Secondary | ICD-10-CM

## 2022-12-14 DIAGNOSIS — K30 Functional dyspepsia: Secondary | ICD-10-CM

## 2022-12-14 DIAGNOSIS — R110 Nausea: Secondary | ICD-10-CM

## 2022-12-14 DIAGNOSIS — K5989 Other specified functional intestinal disorders: Secondary | ICD-10-CM | POA: Diagnosis not present

## 2022-12-14 DIAGNOSIS — H2513 Age-related nuclear cataract, bilateral: Secondary | ICD-10-CM | POA: Diagnosis not present

## 2022-12-14 DIAGNOSIS — G8929 Other chronic pain: Secondary | ICD-10-CM

## 2022-12-14 MED ORDER — METOCLOPRAMIDE HCL 10 MG PO TABS
ORAL_TABLET | ORAL | 5 refills | Status: AC
  Filled 2022-12-14: qty 90, 30d supply, fill #0

## 2022-12-14 NOTE — Patient Instructions (Addendum)
Continue Miralax and Citracel daily.   We have sent the following medications to your pharmacy for you to pick up at your convenience: reglan taking 1/2-1 (5-10 mg) 2-3 x daily.   You will be due for a recall Cologuard in 12/2023. We will send you a reminder in the mail when it gets closer to that time.  _______________________________________________________  If your blood pressure at your visit was 140/90 or greater, please contact your primary care physician to follow up on this.  _______________________________________________________  If you are age 31 or older, your body mass index should be between 23-30. Your Body mass index is 14.57 kg/m. If this is out of the aforementioned range listed, please consider follow up with your Primary Care Provider.  If you are age 67 or younger, your body mass index should be between 19-25. Your Body mass index is 14.57 kg/m. If this is out of the aformentioned range listed, please consider follow up with your Primary Care Provider.   ________________________________________________________  The [facility] providers would like to encourage you to use [facility] to communicate with providers for non-urgent requests or questions.  Due to long hold times on the telephone, sending your provider a message by [facility] may be a faster and more efficient way to get a response.  Please allow 48 business hours for a response.  Please remember that this is for non-urgent requests.  _______________________________________________________

## 2022-12-14 NOTE — Progress Notes (Signed)
Patient ID: Marilyn Collins, female   DOB: 1956/01/10, 67 y.o.   MRN: [id number] HPI: Discussed the use of AI scribe software for clinical note transcription with the patient, who gave verbal consent to proceed.  History of Present Illness   Marilyn Collins is a 67 year old female with a history of celiac disease, chronic upper abdominal pain, myofascial pain syndrome, thoracic and lumbar disc disease, history of migraines and hypothyroidism who presents with chronic abdominal symptoms, primarily constipation and upper abdominal pain.  She is here alone today.    The patient describes the abdominal pain as a laboring sensation and is located in the epigastric region. This discomfort sometimes leads to vomiting, which provides some relief. These symptoms have been present for over a decade, but have worsened in the past year.  The patient has tried various medications, including Reglan and Baclofen, with limited success. Currently, the patient finds the most relief from Metamucil, Miralax, and Reglan, which she takes as needed. The patient also maintains a strict gluten-free diet and has identified certain "safe foods" that do not exacerbate her symptoms.  While waiting on this appointment her primary care provider started her on metoclopramide and she has been using 5 mg with success.  She takes this for the upper abdominal pain and fullness type sensation.  She is also been working with her Citrucel and MiraLAX to keep daily bowel movements.  This also seems to help her upper abdominal symptoms.  In the past she has tried sucralfate and baclofen without benefit.  She does take omeprazole 40 mg daily.  The patient also reports a history of myofascial pain syndrome, for which she takes Gabapentin. She describes having numerous trigger points and knots in her back, leading to chronic aching. The patient also receives bilateral SI injections, which provide temporary relief for about three to four months.  The  patient's thyroid levels have been fluctuating over the past year, requiring adjustments in her Synthroid dosage. The patient suspects that these fluctuations may be contributing to her GI symptoms. The patient also takes a low dose of Klonopin at night to manage stress related to family trauma.  The patient's weight has remained stable, and she denies any trouble swallowing or blood in her stools. The patient's symptoms seem to be managed adequately with her current regimen, and she expresses satisfaction with her current state of health.    Past Medical History:  Diagnosis Date   Acquired hypothyroidism 02/03/2018   Anxiety    Aortic atherosclerosis (HCC) 11/24/2021   Arthritis    Celiac disease 07/20/2011   Chronic idiopathic monocytosis 07/20/2011   Dysphagia 12/2012   responsive to dexilant   GERD (gastroesophageal reflux disease)    H/O eating disorder    Hypothyroidism    Iron deficiency anemia 07/20/2011   Iron deficiency anemia, unspecified 07/20/2011   Migraine headache    Other specified intestinal malabsorption 07/20/2011   Palpitations 11/24/2021    Past Surgical History:  Procedure Laterality Date   ABDOMINAL HYSTERECTOMY     COLONOSCOPY  06/2003   neg   DIAGNOSTIC LAPAROSCOPY     ESOPHAGOGASTRODUODENOSCOPY N/A 10/20/2012   Procedure: ESOPHAGOGASTRODUODENOSCOPY (EGD);  Surgeon: Florencia Reasons, MD;  Location: [facility];  Service: Endoscopy;  Laterality: N/A;   FLEXIBLE SIGMOIDOSCOPY  2005   SINUS IRRIGATION     TMJ ARTHROPLASTY      Outpatient Medications Prior to Visit  Medication Sig Dispense Refill   Acetaminophen (TYLENOL ARTHRITIS PAIN PO) Take by mouth at  bedtime.     baclofen (LIORESAL) 10 MG tablet Take 0.5 tablet (5 mg dose) by mouth twice daily as needed. 30 tablet 0   bifidobacterium infantis (ALIGN) capsule Take 1 capsule by mouth daily.     CALCIUM-VITAMIN D PO Take by mouth.     clonazePAM (KLONOPIN) 0.5 MG tablet 1/2 to 1 tablet Orally Twice a day  as needed 30 days (Patient taking differently: 1/4 tab daily at bedtime) 60 tablet 1   clonazePAM (KLONOPIN) 0.5 MG tablet Take 0.5-1 tablets (0.25-0.5 mg total) by mouth 2 (two) times daily as needed. 30 tablet 1   diclofenac Sodium (VOLTAREN) 1 % GEL Apply topically as needed.     estradiol (VIVELLE-DOT) 0.1 MG/24HR patch Place 1 patch (0.1 mg total) onto the skin 2 (two) times a week. 24 patch 1   gabapentin (NEURONTIN) 100 MG capsule Take 1 capsule (100 mg total) by mouth 3 (three) times daily. 270 capsule 1   levothyroxine (SYNTHROID) 112 MCG tablet Take 1 tablet (112 mcg total) by mouth daily on an empty stomach 4 days per week 48 tablet 3   levothyroxine (SYNTHROID) 125 MCG tablet Take 1 tablet (125 mcg total) by mouth in the morning on an empty stomach 3 times per week 36 tablet 3   Methylcellulose, Laxative, (CITRUCEL PO) Take 1 scoop by mouth daily.     Multiple Vitamin (MULTIVITAMIN PO) Take by mouth daily.     omeprazole (PRILOSEC) 40 MG capsule Take 1 capsule by mouth daily 30 minutes before morning meal 90 capsule 2   ondansetron (ZOFRAN) 4 MG tablet Take 1 tablet (4 mg total) by mouth 2 (two) times daily as needed. 30 tablet 1   RESTASIS 0.05 % ophthalmic emulsion Instill 1 drop into affected eye(s) every 12 (twelve) hours. 180 each 3   SUMAtriptan (IMITREX) 50 MG tablet Take 1 tablet at onset of migraine, wait at least 2 hours between doses as needed. 27 tablet 1   metoCLOPramide (REGLAN) 10 MG tablet Take 1 tablet (10 mg total) by mouth 2 (two) times daily before a meal. 60 tablet 1   No facility-administered medications prior to visit.    Allergies  Allergen Reactions   Morphine And Codeine Nausea And Vomiting   Venofer [Ferric Oxide] Anaphylaxis    Family History  Problem Relation Age of Onset   Cancer Mother    Heart disease Mother    Hyperlipidemia Father    Hypertension Father    Stroke Father    CAD Father    Hypertension Brother    Cancer Maternal Grandmother     Stroke Maternal Grandfather    Heart disease Paternal Grandmother    CAD Paternal Grandfather    Anxiety disorder Son    Bipolar disorder Son    Liver disease Neg Hx    Esophageal cancer Neg Hx     Social History   Tobacco Use   Smoking status: Never    Passive exposure: Never   Smokeless tobacco: Never  Vaping Use   Vaping status: Never Used  Substance Use Topics   Alcohol use: No    Alcohol/week: 0.0 standard drinks of alcohol   Drug use: No    ROS: As per history of present illness, otherwise negative  BP 118/68   Pulse 68   Ht 5\' 7"  (1.702 m)   Wt 93 lb (42.2 kg)   BMI 14.57 kg/m  Gen: awake, alert, NAD HEENT: anicteric  CV: RRR, no mrg Pulm: CTA b/l  Abd: soft, thin, NT/ND, +BS throughout Ext: no c/c/e Neuro: nonfocal   RELEVANT LABS AND IMAGING: CBC    Component Value Date/Time   WBC 5.3 11/25/2021 1026   WBC 7.6 02/07/2019 1426   RBC 4.10 11/25/2021 1026   RBC 3.98 02/07/2019 1426   HGB 13.1 11/25/2021 1026   HGB 13.0 10/24/2012 1225   HCT 39.3 11/25/2021 1026   HCT 39.2 10/24/2012 1225   PLT 261 11/25/2021 1026   MCV 96 11/25/2021 1026   MCV 96.0 10/24/2012 1225   MCH 32.0 11/25/2021 1026   MCH 32.1 03/15/2016 0215   MCHC 33.3 11/25/2021 1026   MCHC 32.9 02/07/2019 1426   RDW 12.2 11/25/2021 1026   RDW 13.2 10/24/2012 1225   LYMPHSABS 2.0 11/25/2021 1026   LYMPHSABS 1.8 10/24/2012 1225   MONOABS 0.6 02/07/2019 1426   MONOABS 0.8 10/24/2012 1225   EOSABS 0.1 11/25/2021 1026   BASOSABS 0.1 11/25/2021 1026   BASOSABS 0.1 10/24/2012 1225    CMP     Component Value Date/Time   NA 138 02/03/2018 1356   NA 141 07/27/2017 0000   K 4.2 02/03/2018 1356   CL 103 02/03/2018 1356   CO2 29 02/03/2018 1356   GLUCOSE 77 02/03/2018 1356   BUN 18 02/03/2018 1356   BUN 22 (A) 07/27/2017 0000   CREATININE 0.75 02/03/2018 1356   CALCIUM 9.7 02/03/2018 1356   PROT 6.5 07/14/2022 0811   ALBUMIN 3.9 07/14/2022 0811   AST 23 07/14/2022 0811    ALT 23 07/14/2022 0811   ALKPHOS 59 07/14/2022 0811   BILITOT 0.5 07/14/2022 4401    Results   RADIOLOGY CT enterography: No evidence of Crohn's or active inflammation; large stool burden suggestive of constipation (01/2021)  DIAGNOSTIC Gastric emptying scan: Negative (08/04/2021) Upper endoscopy with duodenal biopsies: Celiac disease in remission (12/2013) Colonoscopy: Normal (12/14/2013)      ASSESSMENT/PLAN: Assessment and Plan    Chronic Epigastric Abdominal Pain Epigastric pain with bloating, fullness, nausea and occasional vomiting. Symptoms have been present for over a decade but have worsened in the past year. No evidence of gastroparesis or Crohn's disease on previous imaging.  I am suspicious for global intestinal dysmotility/hypomotility and though her GES was normal symptoms do align with gastroparesis and she responds very well to low-dose metoclopramide; celiac disease in remission. Symptoms seem to improve with Metoclopramide 5mg  as needed, Citrucel, and Miralax daily. -Continue current regimen of Metoclopramide 5mg  1-2 times daily as needed, Citrucel, and Miralax daily.  We discussed the rare risk of tardive dyskinesia which with low-dose metoclopramide and sporadic overall infrequent use is highly unlikely. -Consider Motegrity as a potential future treatment option if symptoms worsen or Metoclopramide use increases.  Chronic Constipation Regular bowel movements achieved with daily Citrucel and Miralax. -Continue Citrucel and Miralax daily.  Gastroesophageal Reflux Disease (GERD) Controlled with daily Omeprazole 40mg . -Continue Omeprazole 40mg  daily.  Myofascial Pain Syndrome Managed with Gabapentin 100mg  at night. -Continue Gabapentin 100mg  at night.  General Health Maintenance / Followup Plans -Plan to order Cologuard for colorectal cancer screening in November 2025.  This based on patient's preference given trouble with bowel prep in the past and retained air.   She understands if Cologuard is positive she would need colonoscopy.  -Annual follow-up visit unless symptoms worsen.       UU:VOZDG, [facility] [address] [place],  [place] [address]

## 2022-12-15 ENCOUNTER — Telehealth: Payer: Medicare Other | Admitting: Physician Assistant

## 2022-12-15 DIAGNOSIS — R300 Dysuria: Secondary | ICD-10-CM | POA: Diagnosis not present

## 2022-12-15 DIAGNOSIS — N12 Tubulo-interstitial nephritis, not specified as acute or chronic: Secondary | ICD-10-CM | POA: Diagnosis not present

## 2022-12-15 DIAGNOSIS — R3989 Other symptoms and signs involving the genitourinary system: Secondary | ICD-10-CM

## 2022-12-15 NOTE — Progress Notes (Signed)
Because of risk of complicated UTI and antibiotic resistance in those 43 and older with UTI, the standard of care is to have a urine culture obtained to make sure proper treatment course is given. Because of this, I feel your condition warrants further evaluation and I recommend that you be seen in a face to face visit.   NOTE: There will be NO CHARGE for this eVisit   If you are having a true medical emergency please call 911.      For an urgent face to face visit, [place] has eight urgent care centers for your convenience:   NEW!! [facility] at [facility] Get Driving Directions [phone] [address], [address] [place], [address]    [facility] Care Center at [facility] Get Driving Directions [phone] [address] [place], [place] [address]   [facility] Urgent Care Center [facility]) Get Driving Directions [phone] [address] [place], [place] [address]  [facility] Center [facility]) Get Driving Directions [phone] [address] [place],  [place]  [address]  [facility] Health Urgent Care Center [facility] - at [employer]  [phone] [address] [place],  [place] [address]   [facility] Health Urgent Care at [facility] Get Driving Directions [phone] [address], [address] [place], [place] [address]   [facility] Health Urgent Care at [facility] Get Driving Directions  [phone] [address] [place], [place] [address]   [facility] Health Urgent Care at [facility] Directions [phone] [address] [place], [place] [address]  Your MyChart E-visit questionnaire answers were reviewed by a board certified advanced clinical practitioner to complete your personal care plan based on your specific symptoms.  Thank you for using e-Visits.

## 2022-12-19 ENCOUNTER — Other Ambulatory Visit (HOSPITAL_BASED_OUTPATIENT_CLINIC_OR_DEPARTMENT_OTHER): Payer: Self-pay

## 2022-12-21 ENCOUNTER — Other Ambulatory Visit (HOSPITAL_BASED_OUTPATIENT_CLINIC_OR_DEPARTMENT_OTHER): Payer: Self-pay

## 2022-12-21 MED ORDER — OMEPRAZOLE 40 MG PO CPDR
40.0000 mg | DELAYED_RELEASE_CAPSULE | Freq: Every day | ORAL | 2 refills | Status: DC
  Filled 2022-12-21: qty 90, 90d supply, fill #0
  Filled 2023-03-15: qty 90, 90d supply, fill #1
  Filled 2023-06-09: qty 90, 90d supply, fill #2

## 2022-12-26 DIAGNOSIS — N3000 Acute cystitis without hematuria: Secondary | ICD-10-CM | POA: Diagnosis not present

## 2022-12-26 DIAGNOSIS — R300 Dysuria: Secondary | ICD-10-CM | POA: Diagnosis not present

## 2023-01-18 DIAGNOSIS — L821 Other seborrheic keratosis: Secondary | ICD-10-CM | POA: Diagnosis not present

## 2023-01-18 DIAGNOSIS — D2372 Other benign neoplasm of skin of left lower limb, including hip: Secondary | ICD-10-CM | POA: Diagnosis not present

## 2023-01-18 DIAGNOSIS — L814 Other melanin hyperpigmentation: Secondary | ICD-10-CM | POA: Diagnosis not present

## 2023-01-18 DIAGNOSIS — D225 Melanocytic nevi of trunk: Secondary | ICD-10-CM | POA: Diagnosis not present

## 2023-01-19 DIAGNOSIS — H52223 Regular astigmatism, bilateral: Secondary | ICD-10-CM | POA: Diagnosis not present

## 2023-01-19 DIAGNOSIS — Z01818 Encounter for other preprocedural examination: Secondary | ICD-10-CM | POA: Diagnosis not present

## 2023-01-19 DIAGNOSIS — H25811 Combined forms of age-related cataract, right eye: Secondary | ICD-10-CM | POA: Diagnosis not present

## 2023-01-19 DIAGNOSIS — H2512 Age-related nuclear cataract, left eye: Secondary | ICD-10-CM | POA: Diagnosis not present

## 2023-02-10 DIAGNOSIS — H2511 Age-related nuclear cataract, right eye: Secondary | ICD-10-CM | POA: Diagnosis not present

## 2023-02-10 DIAGNOSIS — H25811 Combined forms of age-related cataract, right eye: Secondary | ICD-10-CM | POA: Diagnosis not present

## 2023-02-10 DIAGNOSIS — E039 Hypothyroidism, unspecified: Secondary | ICD-10-CM | POA: Diagnosis not present

## 2023-02-10 DIAGNOSIS — F419 Anxiety disorder, unspecified: Secondary | ICD-10-CM | POA: Diagnosis not present

## 2023-02-24 DIAGNOSIS — H52223 Regular astigmatism, bilateral: Secondary | ICD-10-CM | POA: Diagnosis not present

## 2023-02-24 DIAGNOSIS — E039 Hypothyroidism, unspecified: Secondary | ICD-10-CM | POA: Diagnosis not present

## 2023-02-24 DIAGNOSIS — H2512 Age-related nuclear cataract, left eye: Secondary | ICD-10-CM | POA: Diagnosis not present

## 2023-02-26 ENCOUNTER — Other Ambulatory Visit (HOSPITAL_BASED_OUTPATIENT_CLINIC_OR_DEPARTMENT_OTHER): Payer: Self-pay

## 2023-02-26 MED ORDER — CAPVAXIVE 0.5 ML IM SOSY
0.5000 mL | PREFILLED_SYRINGE | Freq: Once | INTRAMUSCULAR | 0 refills | Status: AC
  Filled 2023-02-26: qty 0.5, 1d supply, fill #0

## 2023-03-08 DIAGNOSIS — H524 Presbyopia: Secondary | ICD-10-CM | POA: Diagnosis not present

## 2023-03-15 ENCOUNTER — Other Ambulatory Visit (HOSPITAL_BASED_OUTPATIENT_CLINIC_OR_DEPARTMENT_OTHER): Payer: Self-pay

## 2023-03-15 ENCOUNTER — Other Ambulatory Visit: Payer: Self-pay

## 2023-03-16 ENCOUNTER — Other Ambulatory Visit (HOSPITAL_BASED_OUTPATIENT_CLINIC_OR_DEPARTMENT_OTHER): Payer: Self-pay

## 2023-03-16 ENCOUNTER — Other Ambulatory Visit: Payer: Self-pay

## 2023-03-16 MED ORDER — SUMATRIPTAN SUCCINATE 50 MG PO TABS
50.0000 mg | ORAL_TABLET | ORAL | 1 refills | Status: DC | PRN
  Filled 2023-03-16: qty 27, 90d supply, fill #0
  Filled 2023-08-26: qty 27, 90d supply, fill #1

## 2023-04-17 ENCOUNTER — Other Ambulatory Visit (HOSPITAL_BASED_OUTPATIENT_CLINIC_OR_DEPARTMENT_OTHER): Payer: Self-pay

## 2023-04-28 DIAGNOSIS — H43813 Vitreous degeneration, bilateral: Secondary | ICD-10-CM | POA: Diagnosis not present

## 2023-05-27 ENCOUNTER — Other Ambulatory Visit: Payer: Self-pay | Admitting: Physical Medicine and Rehabilitation

## 2023-05-27 DIAGNOSIS — M461 Sacroiliitis, not elsewhere classified: Secondary | ICD-10-CM

## 2023-06-02 ENCOUNTER — Other Ambulatory Visit: Payer: Self-pay

## 2023-06-02 ENCOUNTER — Ambulatory Visit: Admitting: Physical Medicine and Rehabilitation

## 2023-06-02 VITALS — BP 112/72 | HR 69

## 2023-06-02 DIAGNOSIS — M461 Sacroiliitis, not elsewhere classified: Secondary | ICD-10-CM | POA: Diagnosis not present

## 2023-06-02 MED ORDER — TRIAMCINOLONE ACETONIDE 40 MG/ML IJ SUSP
40.0000 mg | INTRAMUSCULAR | Status: AC | PRN
Start: 2023-06-02 — End: 2023-06-02
  Administered 2023-06-02: 40 mg via INTRA_ARTICULAR

## 2023-06-02 MED ORDER — BUPIVACAINE HCL 0.25 % IJ SOLN
2.0000 mL | INTRAMUSCULAR | Status: AC | PRN
  Administered 2023-06-02: 2 mL via INTRA_ARTICULAR

## 2023-06-02 NOTE — Progress Notes (Signed)
 Pain Scale   Average Pain 6 Patient advising that her pain increases when moving a lot she advised she is on her feet at work and moving , her pain increases she advised no radiating to legs however states she does experience some pelvic discomfort        +Driver, -BT, -Dye Allergies.

## 2023-06-02 NOTE — Progress Notes (Signed)
 CEARRA NORWICK - 68 y.o. female MRN [id number]  Date of birth: 1955/11/29  Office Visit Note: Visit Date: 06/02/2023 PCP: Gwyndolyn Lerner, PA-C Referred by: Therman Flatter  Subjective: Chief Complaint  Patient presents with   Lower Back - Pain   HPI:  JESILYN WALTERMIRE is a 67 y.o. female who comes in today at the request of Elvan Hamel, FNP for planned Bilateral anesthetic Sacroiliac joint arthrogram with fluoroscopic guidance.  The patient has failed conservative care including home exercise, medications, time and activity modification.  This injection will be diagnostic and hopefully therapeutic.  Please see requesting physician notes for further details and justification.   Positive Fortin finger sign, Patrick's testing,  lateral compression test.     ROS Otherwise per HPI.  Assessment & Plan: Visit Diagnoses:    ICD-10-CM   1. Sacroiliitis (HCC)  M46.1 XR C-ARM NO REPORT      Plan: No additional findings.   Meds & Orders: No orders of the defined types were placed in this encounter.   Orders Placed This Encounter  Procedures   Sacroiliac Joint Inj   XR C-ARM NO REPORT    Follow-up: Return if symptoms worsen or fail to improve.   Procedures: Sacroiliac Joint Inj on 06/02/2023 1:05 PM Indications: pain and diagnostic evaluation Details: 22 G 3.5 in needle, fluoroscopy-guided posterior approach Medications (Right): 40 mg triamcinolone  acetonide 40 MG/ML; 2 mL bupivacaine  0.25 % Medications (Left): 40 mg triamcinolone  acetonide 40 MG/ML; 2 mL bupivacaine  0.25 % Outcome: tolerated well, no immediate complications  Sacroiliac Joint Intra-Articular Injection - Posterior Approach with Fluoroscopic Guidance   Position: PRONE  Additional Comments: Vital signs were monitored before and after the procedure. Patient was prepped and draped in the usual sterile fashion. The correct patient, procedure, and site was verified.   Injection Procedure Details:    Location/Site:  Sacroiliac joint  Needle size: 3.5 in Spinal Needle  Needle type: Spinal  Needle Placement: Intra-articular  Findings:  -Comments: There was excellent flow of contrast producing a partial arthrogram of the sacroiliac joint.   Procedure Details: Starting with a 90 degree vertical and midline orientation the fluoroscope was tilted cranially 20 to 25 degrees and the target area of the inferior most part of the SI joint on the side mentioned above was visualized.  The soft tissues overlying this target were infiltrated with 4 ml. of 1% Lidocaine  without Epinephrine. A #22 gauge spinal needle was inserted perpendicular to the fluoroscope table and advanced into the posterior inferior joint space using fluoroscopic guidance.  Position in the joint space was confirmed by obtaining a partial arthrogram using a 2 ml. volume of Isovue -250 contrast agent. After negative aspirate for gross pus or blood, the injectate was delivered to the joint. Radiographs were obtained for documentation purposes.   Additional Comments:   Dressing: Bandaid    Post-procedure details: Patient was observed during the procedure. Post-procedure instructions were reviewed.  Patient left the clinic in stable condition.    There was excellent flow of contrast producing a partial arthrogram of the sacroiliac joint.  Procedure, treatment alternatives, risks and benefits explained, specific risks discussed. Consent was given by the patient. Immediately prior to procedure a time out was called to verify the correct patient, procedure, equipment, support staff and site/side marked as required. Patient was prepped and draped in the usual sterile fashion.          Clinical History: XR Pelvic 1-2 Views 11/19/2020  No SI  joint sclerosis or narrowing was noted.  Some osteoarthritic changes  were noted in the SI joints.  No hip joint narrowing was noted.   Impression: These findings are consistent  with mild osteoarthritic changes  of the SI joints. -------------------------------- MRI LUMBAR SPINE WITHOUT CONTRAST  02/17/2011  Technique:  Multiplanar and multiecho pulse sequences of the lumbar  spine were obtained without intravenous contrast.   Comparison: Lumbar MRI 03/06/2004.   Findings: Five lumbar type vertebral bodies are assumed.  The  alignment is stable.  Scattered hemangiomas are stable, most  prominent at T11.  Compared with the prior examinations, the  patient has developed diffusely decreased marrow signal throughout  the spine. There are no new focal marrow lesions.  In addition, the  liver demonstrates diffusely decreased T2 signal.  The spleen is  not imaged.   The conus medullaris extends to the L1-L2 level and appears normal.  Bilateral sacral Tarlov cysts are stable.  There are no paraspinal  abnormalities.   Based on the sagittal images, mild disc bulging at T10-T11 appears  stable.   There are no significant disc space findings from T11-T12 through  L2-L3.   L3-L4:  Disc height and hydration are maintained.  There is mild  bilateral facet hypertrophy without resulting spinal stenosis or  nerve root encroachment.   L4-L5:  Disc desiccation and a small posterolateral disc protrusion  on the right are chronic and unchanged.  There is mild bilateral  facet hypertrophy.  No foraminal compromise or nerve root  encroachment is present.   L5-S1:  Disc height and hydration are maintained.  There is mild  bilateral facet hypertrophy without resulting foraminal compromise  or nerve root encroachment.   IMPRESSION:   1.  Stable small posterolateral disc protrusion on the right at L4-  L5.  No resulting spinal stenosis or nerve root encroachment.  2.  Mild facet disease inferiorly.  3.  No acute disc space findings, significant spinal stenosis or  nerve root encroachment.  4.  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 Correlate clinically.     Objective:  VS:  HT:    WT:   BMI:     BP:112/72  HR:69bpm  TEMP: ( )  RESP:  Physical Exam Vitals and nursing note reviewed.  Constitutional:      General: She is not in acute distress.    Appearance: Normal appearance. She is well-developed. She is not ill-appearing.  HENT:     Head: Normocephalic and atraumatic.  Eyes:     Conjunctiva/sclera: Conjunctivae normal.     Pupils: Pupils are equal, round, and reactive to light.  Cardiovascular:     Rate and Rhythm: Normal rate.     Pulses: Normal pulses.  Pulmonary:     Effort: Pulmonary effort is normal.  Musculoskeletal:     Right lower leg: No edema.     Left lower leg: No edema.  Skin:    General: Skin is warm and dry.     Findings: No erythema or rash.  Neurological:     General: No focal deficit present.     Mental Status: She is alert and oriented to person, place, and time.     Sensory: No sensory deficit.     Motor: No abnormal muscle tone.     Coordination: Coordination normal.     Gait: Gait normal.  Psychiatric:        Mood and Affect: Mood normal.  Behavior: Behavior normal.      Imaging: XR C-ARM NO REPORT Result Date: 06/02/2023 Please see Notes tab for imaging impression.

## 2023-06-02 NOTE — Patient Instructions (Signed)

## 2023-06-09 ENCOUNTER — Other Ambulatory Visit (HOSPITAL_BASED_OUTPATIENT_CLINIC_OR_DEPARTMENT_OTHER): Payer: Self-pay

## 2023-06-14 DIAGNOSIS — K080 Exfoliation of teeth due to systemic causes: Secondary | ICD-10-CM | POA: Diagnosis not present

## 2023-07-01 ENCOUNTER — Other Ambulatory Visit: Payer: Self-pay

## 2023-07-01 ENCOUNTER — Other Ambulatory Visit (HOSPITAL_BASED_OUTPATIENT_CLINIC_OR_DEPARTMENT_OTHER): Payer: Self-pay

## 2023-07-01 MED ORDER — CLONAZEPAM 0.5 MG PO TABS
0.2500 mg | ORAL_TABLET | Freq: Two times a day (BID) | ORAL | 1 refills | Status: AC | PRN
  Filled 2023-07-01: qty 60, 30d supply, fill #0
  Filled 2023-12-16: qty 60, 30d supply, fill #1

## 2023-07-07 ENCOUNTER — Other Ambulatory Visit (HOSPITAL_BASED_OUTPATIENT_CLINIC_OR_DEPARTMENT_OTHER): Payer: Self-pay

## 2023-07-08 ENCOUNTER — Other Ambulatory Visit: Payer: Self-pay

## 2023-07-08 ENCOUNTER — Other Ambulatory Visit (HOSPITAL_BASED_OUTPATIENT_CLINIC_OR_DEPARTMENT_OTHER): Payer: Self-pay

## 2023-07-08 MED ORDER — LEVOTHYROXINE SODIUM 125 MCG PO TABS
125.0000 ug | ORAL_TABLET | Freq: Every morning | ORAL | 3 refills | Status: AC
  Filled 2023-07-08: qty 36, 84d supply, fill #0
  Filled 2023-10-04: qty 36, 84d supply, fill #1
  Filled 2023-12-27: qty 36, 84d supply, fill #2

## 2023-07-09 ENCOUNTER — Other Ambulatory Visit (HOSPITAL_BASED_OUTPATIENT_CLINIC_OR_DEPARTMENT_OTHER): Payer: Self-pay

## 2023-07-14 ENCOUNTER — Other Ambulatory Visit (HOSPITAL_BASED_OUTPATIENT_CLINIC_OR_DEPARTMENT_OTHER): Payer: Self-pay

## 2023-07-14 MED ORDER — LEVOTHYROXINE SODIUM 112 MCG PO TABS
112.0000 ug | ORAL_TABLET | ORAL | 0 refills | Status: DC
  Filled 2023-07-14: qty 48, 84d supply, fill #0

## 2023-08-24 ENCOUNTER — Other Ambulatory Visit (HOSPITAL_BASED_OUTPATIENT_CLINIC_OR_DEPARTMENT_OTHER): Payer: Self-pay

## 2023-08-26 ENCOUNTER — Other Ambulatory Visit (HOSPITAL_BASED_OUTPATIENT_CLINIC_OR_DEPARTMENT_OTHER): Payer: Self-pay

## 2023-08-28 ENCOUNTER — Other Ambulatory Visit (HOSPITAL_BASED_OUTPATIENT_CLINIC_OR_DEPARTMENT_OTHER): Payer: Self-pay

## 2023-08-31 ENCOUNTER — Other Ambulatory Visit (HOSPITAL_BASED_OUTPATIENT_CLINIC_OR_DEPARTMENT_OTHER): Payer: Self-pay

## 2023-09-09 ENCOUNTER — Other Ambulatory Visit (HOSPITAL_BASED_OUTPATIENT_CLINIC_OR_DEPARTMENT_OTHER): Payer: Self-pay

## 2023-09-09 MED ORDER — OMEPRAZOLE 40 MG PO CPDR
DELAYED_RELEASE_CAPSULE | ORAL | 2 refills | Status: AC
  Filled 2023-09-09: qty 90, 90d supply, fill #0
  Filled 2023-12-16: qty 90, 90d supply, fill #1
  Filled 2024-03-10: qty 90, 90d supply, fill #2

## 2023-09-13 ENCOUNTER — Other Ambulatory Visit (HOSPITAL_BASED_OUTPATIENT_CLINIC_OR_DEPARTMENT_OTHER): Payer: Self-pay

## 2023-10-04 ENCOUNTER — Other Ambulatory Visit: Payer: Self-pay | Admitting: Physician Assistant

## 2023-10-04 ENCOUNTER — Other Ambulatory Visit (HOSPITAL_BASED_OUTPATIENT_CLINIC_OR_DEPARTMENT_OTHER): Payer: Self-pay

## 2023-10-04 MED ORDER — LEVOTHYROXINE SODIUM 112 MCG PO TABS
112.0000 ug | ORAL_TABLET | Freq: Every morning | ORAL | 0 refills | Status: DC
  Filled 2023-10-04: qty 48, 84d supply, fill #0

## 2023-10-05 ENCOUNTER — Other Ambulatory Visit: Payer: Self-pay

## 2023-10-05 NOTE — Progress Notes (Signed)
 Office Visit Note  Patient: Marilyn Collins             Date of Birth: [birth year]           MRN: [id number]             PCP: Lanis Thresa JAYSON DEVONNA Referring: Lanis Thresa JAYSON DEVONNA Visit Date: 10/18/2023 Occupation: @GUAROCC @  Subjective:  Pain in right hand, and both feet.  History of Present Illness: Marilyn Collins is a 68 y.o. female returns today after her last visit in September 2024.  She has known history of sicca symptoms, osteoarthritis, degenerative disc disease and myofascial pain syndrome.  She states she continues to have some discomfort in her right shoulder.  She has been experiencing increased pain and discomfort in her right hand and both feet.  She notices some swelling in her left hand which she describes over the left second MCP and left second PIP joints.  She continues to get SI joint injections with Dr. Eldonna for lower back pain.  She continues to have dry mouth and dry eyes symptoms.  She has been using Restasis .  She continues to have some discomfort from myofascial pain syndrome.  She went to integrative therapies in the past.    Activities of Daily Living:  Patient reports morning stiffness for 24 hours.   Patient Denies nocturnal pain.  Difficulty dressing/grooming: Denies Difficulty climbing stairs: Denies Difficulty getting out of chair: Denies Difficulty using hands for taps, buttons, cutlery, and/or writing: Reports  Review of Systems  Constitutional:  Positive for fatigue.  HENT:  Positive for mouth dryness. Negative for mouth sores.   Eyes:  Positive for dryness.  Respiratory:  Negative for shortness of breath.   Cardiovascular:  Negative for chest pain and palpitations.  Gastrointestinal:  Positive for constipation. Negative for blood in stool and diarrhea.  Endocrine: Negative for increased urination.  Genitourinary:  Negative for involuntary urination.  Musculoskeletal:  Positive for joint pain, joint pain, joint swelling, myalgias, muscle  weakness, morning stiffness, muscle tenderness and myalgias. Negative for gait problem.  Skin:  Negative for color change, rash, hair loss and sensitivity to sunlight.  Allergic/Immunologic: Negative for susceptible to infections.  Neurological:  Positive for headaches. Negative for dizziness.  Hematological:  Negative for swollen glands.  Psychiatric/Behavioral:  Negative for depressed mood and sleep disturbance. The patient is not nervous/anxious.     PMFS History:  Patient Active Problem List   Diagnosis Date Noted   Chronic migraine w/o aura w/o status migrainosus, not intractable 04/17/2022   Muscle spasm 04/17/2022   Palpitations 11/24/2021   Aortic atherosclerosis (HCC) 11/24/2021   DDD (degenerative disc disease), lumbar 11/19/2020   Thoracic disc disease 04/19/2019   Myofascial pain syndrome 04/19/2019   Idiopathic peripheral neuropathy 02/07/2019   Acquired hypothyroidism 02/03/2018   Hormone replacement therapy (HRT) 02/03/2018   Underweight 02/03/2018   GERD (gastroesophageal reflux disease)    Postural kyphosis of thoracic region 03/16/2017   Neck pain 11/20/2016   Chronic right-sided thoracic back pain 08/03/2016   Celiac disease 07/20/2011   Dietary iron deficiency - malabsorption 07/20/2011   Chronic idiopathic monocytosis 07/20/2011    Past Medical History:  Diagnosis Date   Acquired hypothyroidism 02/03/2018   Anxiety    Aortic atherosclerosis (HCC) 11/24/2021   Arthritis    Celiac disease 07/20/2011   Chronic idiopathic monocytosis 07/20/2011   Dysphagia 12/2012   responsive to dexilant    GERD (gastroesophageal reflux disease)    H/O eating  disorder    Hypothyroidism    Iron deficiency anemia 07/20/2011   Iron deficiency anemia, unspecified 07/20/2011   Migraine headache    Other specified intestinal malabsorption 07/20/2011   Palpitations 11/24/2021    Family History  Problem Relation Age of Onset   Cancer Mother    Heart disease Mother     Hyperlipidemia Father    Hypertension Father    Stroke Father    CAD Father    Hypertension Brother    Cancer Maternal Grandmother    Stroke Maternal Grandfather    Heart disease Paternal Grandmother    CAD Paternal Grandfather    Anxiety disorder Son    Bipolar disorder Son    Liver disease Neg Hx    Esophageal cancer Neg Hx    Past Surgical History:  Procedure Laterality Date   ABDOMINAL HYSTERECTOMY     COLONOSCOPY  06/2003   neg   DIAGNOSTIC LAPAROSCOPY     ESOPHAGOGASTRODUODENOSCOPY N/A 10/20/2012   Procedure: ESOPHAGOGASTRODUODENOSCOPY (EGD);  Surgeon: Lamar LULLA Bunk, MD;  Location: [facility];  Service: Endoscopy;  Laterality: N/A;   FLEXIBLE SIGMOIDOSCOPY  2005   SINUS IRRIGATION     TMJ ARTHROPLASTY     Social History   Social History Narrative   RN at [employer]   Married   2 boys   Immunization History  Administered Date(s) Administered   Fluad  Trivalent(High Dose 65+) 11/23/2022   Influenza-Unspecified 10/19/2016, 10/21/2018   Pfizer Covid-19 Vaccine Bivalent Booster 4yrs & up 01/02/2021   Pneumococcal Conjugate Pcv21, Polysaccharide Crm197 Conjugaf 02/26/2023   Td 02/26/2009   Tdap 10/03/2016   Zoster Recombinant(Shingrix ) 02/03/2018, 08/16/2018     Objective: Vital Signs: BP 113/71   Pulse 64   Temp (!) 97.5 F (36.4 C)   Resp 14   Ht 5' 7 (1.702 m)   Wt 92 lb (41.7 kg)   BMI 14.41 kg/m    Physical Exam Vitals and nursing note reviewed.  Constitutional:      Appearance: She is well-developed.  HENT:     Head: Normocephalic and atraumatic.  Eyes:     Conjunctiva/sclera: Conjunctivae normal.  Cardiovascular:     Rate and Rhythm: Normal rate and regular rhythm.     Heart sounds: Normal heart sounds.  Pulmonary:     Effort: Pulmonary effort is normal.     Breath sounds: Normal breath sounds.  Abdominal:     General: Bowel sounds are normal.     Palpations: Abdomen is soft.  Musculoskeletal:     Cervical back: Normal range of  motion.  Lymphadenopathy:     Cervical: No cervical adenopathy.  Skin:    General: Skin is warm and dry.     Capillary Refill: Capillary refill takes less than 2 seconds.  Neurological:     Mental Status: She is alert and oriented to person, place, and time.  Psychiatric:        Behavior: Behavior normal.      Musculoskeletal Exam: She had limited lateral rotation of the cervical spine to the left.  Shoulders with good range of motion with some discomfort in her right shoulder.  Elbow joints and wrist joints with good range of motion.  She has synovitis over left second MCP joint.  Some inflammation was also noted in the PIP joints.  She had difficulty making a fist with her left hand.  Hip joints and knee joints with good range of motion.  She had tenderness on palpation of her right  second MTP joint.  CDAI Exam: CDAI Score: -- Patient Global: --; Provider Global: -- Swollen: 6 ; Tender: 7  Joint Exam 10/18/2023      Right  Left  MCP 2     Swollen Tender  PIP 2 (finger)  Swollen Tender  Swollen Tender  PIP 3 (finger)  Swollen Tender  Swollen Tender  PIP 4 (finger)     Swollen Tender  MTP 2   Tender        Investigation: No additional findings.  Imaging: No results found.  Recent Labs: Lab Results  Component Value Date   WBC 5.3 11/25/2021   HGB 13.1 11/25/2021   PLT 261 11/25/2021   NA 138 02/03/2018   K 4.2 02/03/2018   CL 103 02/03/2018   CO2 29 02/03/2018   GLUCOSE 77 02/03/2018   BUN 18 02/03/2018   CREATININE 0.75 02/03/2018   BILITOT 0.5 07/14/2022   ALKPHOS 59 07/14/2022   AST 23 07/14/2022   ALT 23 07/14/2022   PROT 6.5 07/14/2022   ALBUMIN 3.9 07/14/2022   CALCIUM  9.7 02/03/2018    Speciality Comments: No specialty comments available.  Procedures:  No procedures performed Allergies: Morphine and codeine and Venofer [ferric oxide]   Assessment / Plan:     Visit Diagnoses: Seronegative rheumatoid arthritis (HCC)-she had negative serology in the  past.  She presents with increased pain and inflammation in her bilateral hands which has been going on for several months now.  She has synovitis over left second MCP joint and several of the PIP joints as described above.  She has been also having tenderness over the right second MTP joint.  I did detailed discussion with the patient with the most likely diagnosis of seronegative rheumatoid arthritis.  I will obtain labs today.  As she was in a lot of discomfort I placed her on prednisone  taper starting at 20 mg and taper by 5 mg every 2 days as a bridging therapy.  Side effects of prednisone  including the risk of hyperglycemia, hypertension, cataracts, osteoporosis and heart disease were also discussed.  I also discussed possible use of hydroxychloroquine.  Side effects of hydroxychloroquine were discussed at length.  A handout was given and consent was taken.  Baseline eye examination was advised to monitor for ocular toxicity.  Should be getting annular eye examination.  High risk medication use-plan Plaquenil 200 mg p.o. daily after the lab results are available.  She was advised to get labs in a month, 3 months and then every 5 months.  Information reimmunization was placed in the AVS.  Sicca complex (HCC) - SSA negative, SSB negative, RF negative.  She has been using Restasis  and over-the-counter products which helped to some extent.  Chronic right shoulder pain-she has history of intermittent pain in her right shoulder.  Pain in both hands -she complains of pain and discomfort in her bilateral hands.  Synovitis was noted over the left second MCP joint and several of her PIP joints.  X-rays of bilateral hands were obtained today.  Increased narrowing of MCP joints was noted when compared to the x-rays of 2022.  These findings were suggestive of inflammatory arthritis and osteoarthritis overlap.  Plan: Sedimentation rate, Rheumatoid factor, Cyclic citrul peptide antibody, IgG, Magnesium, C-reactive  protein  Primary osteoarthritis of both hands - Bilateral CMC arthritis.  She is unable to use CMC brace at work.  Joint protection was discussed.  Trigger finger, left middle finger-she has intermittent triggering.  Pain in both feet -  she complains of discomfort in her bilateral feet.  She had tenderness over her right second MTP joint.  X-rays of bilateral feet were obtained today.  X-ray findings with history of osteoarthritis.  No significant radiographic progression was noted when compared to the x-rays of 2022.  Plan: Uric acid  Chronic right SI joint pain - Mild osteoarthritic changes were noted on the x-rays in October 2022. She had bilateral SI joint cortisone injections by Dr. Eldonna on December 17, 2021.  Primary osteoarthritis of both feet-she had first MTP, PIP and DIP thickening.  Proper fitting shoes were advised.  DDD (degenerative disc disease), cervical-she had limited lateral rotation especially to the left.  Postural kyphosis of thoracic region  DDD (degenerative disc disease), thoracic-she had no point tenderness.  She  Spondylosis of lumbar spine - followed by Dr. Eldonna.  Other fatigue -continues to have fatigue.  Plan: CBC with Differential/Platelet, Comprehensive metabolic panel with GFR, Glucose 6 phosphate dehydrogenase  Myofascial pain syndrome -she continues to have generalized pain and discomfort from myofascial pain syndrome.  Baclofen  5 mg p.o. twice daily she as needed and gabapentin  200 p.o. nightly.  She goes to integrative therapies.  Osteopenia of multiple sites - October 13, 2021 the BMD measured at DualFemur Neck Right is 0.779 g/cm2 with aT-score of -1.9. -Calcium  rich diet vitamin D  was advised.  Regular exercise was advised.  Plan: Parathyroid  hormone, intact (no Ca), DG Bone Density  Celiac disease-not symptomatic per patient.  Dietary iron deficiency - malabsorption  History of gastroesophageal reflux (GERD)  Chronic idiopathic  monocytosis  Idiopathic peripheral neuropathy  Acquired hypothyroidism  Family history of rheumatoid arthritis-paternal aunt  Orders: Orders Placed This Encounter  Procedures   DG Bone Density   XR Hand 2 View Right   XR Hand 2 View Left   XR Foot 2 Views Right   XR Foot 2 Views Left   CBC with Differential/Platelet   Comprehensive metabolic panel with GFR   Sedimentation rate   Rheumatoid factor   Cyclic citrul peptide antibody, IgG   Uric acid   Magnesium   Parathyroid  hormone, intact (no Ca)   C-reactive protein   Glucose 6 phosphate dehydrogenase   Meds ordered this encounter  Medications   predniSONE  (DELTASONE ) 5 MG tablet    Sig: Take 4 tablets (20 mg total) by mouth daily for 2 days, THEN 3 tablets (15 mg total) daily for 2 days, THEN 2 tablets (10 mg total) daily for 2 days, THEN 1 tablet (5 mg total) daily for 2 days.    Dispense:  20 tablet    Refill:  0     Follow-Up Instructions: Return in about 3 months (around 01/17/2024) for Osteoarthritis, Rheumatoid arthritis.   Maya Nash, MD  Note - This record has been created using Animal nutritionist.  Chart creation errors have been sought, but may not always  have been located. Such creation errors do not reflect on  the standard of medical care.

## 2023-10-06 ENCOUNTER — Other Ambulatory Visit (HOSPITAL_BASED_OUTPATIENT_CLINIC_OR_DEPARTMENT_OTHER): Payer: Self-pay

## 2023-10-08 ENCOUNTER — Other Ambulatory Visit (HOSPITAL_BASED_OUTPATIENT_CLINIC_OR_DEPARTMENT_OTHER): Payer: Self-pay

## 2023-10-08 ENCOUNTER — Other Ambulatory Visit: Payer: Self-pay

## 2023-10-08 MED ORDER — GABAPENTIN 100 MG PO CAPS
100.0000 mg | ORAL_CAPSULE | Freq: Three times a day (TID) | ORAL | 1 refills | Status: AC
  Filled 2023-10-08: qty 270, 90d supply, fill #0

## 2023-10-11 ENCOUNTER — Other Ambulatory Visit: Payer: Self-pay

## 2023-10-11 ENCOUNTER — Telehealth: Payer: Self-pay | Admitting: Internal Medicine

## 2023-10-11 DIAGNOSIS — Z1211 Encounter for screening for malignant neoplasm of colon: Secondary | ICD-10-CM

## 2023-10-11 NOTE — Telephone Encounter (Signed)
 Received a call from patient regarding recall for Cologuard, states her and Dr. Albertus went over it and he advised cologuard would be enough. Is requesting fu to determine scheduling or in depth discussion. Please review and advise  Thank you

## 2023-10-11 NOTE — Telephone Encounter (Signed)
 Recall is in epic for cologuard. Order place, pt aware and knows she should receive the kit in the mail at her home.

## 2023-10-14 DIAGNOSIS — Z1211 Encounter for screening for malignant neoplasm of colon: Secondary | ICD-10-CM | POA: Diagnosis not present

## 2023-10-18 ENCOUNTER — Ambulatory Visit (INDEPENDENT_AMBULATORY_CARE_PROVIDER_SITE_OTHER)

## 2023-10-18 ENCOUNTER — Other Ambulatory Visit (HOSPITAL_BASED_OUTPATIENT_CLINIC_OR_DEPARTMENT_OTHER): Payer: Self-pay

## 2023-10-18 ENCOUNTER — Encounter: Payer: Self-pay | Admitting: Rheumatology

## 2023-10-18 ENCOUNTER — Ambulatory Visit

## 2023-10-18 ENCOUNTER — Telehealth: Payer: Self-pay | Admitting: Pharmacist

## 2023-10-18 ENCOUNTER — Ambulatory Visit: Payer: Medicare Other | Attending: Rheumatology | Admitting: Rheumatology

## 2023-10-18 VITALS — BP 113/71 | HR 64 | Temp 97.5°F | Resp 14 | Ht 67.0 in | Wt 92.0 lb

## 2023-10-18 DIAGNOSIS — G8929 Other chronic pain: Secondary | ICD-10-CM

## 2023-10-18 DIAGNOSIS — M79642 Pain in left hand: Secondary | ICD-10-CM

## 2023-10-18 DIAGNOSIS — M79672 Pain in left foot: Secondary | ICD-10-CM | POA: Diagnosis not present

## 2023-10-18 DIAGNOSIS — M3500 Sicca syndrome, unspecified: Secondary | ICD-10-CM

## 2023-10-18 DIAGNOSIS — M8589 Other specified disorders of bone density and structure, multiple sites: Secondary | ICD-10-CM

## 2023-10-18 DIAGNOSIS — M19042 Primary osteoarthritis, left hand: Secondary | ICD-10-CM

## 2023-10-18 DIAGNOSIS — M25511 Pain in right shoulder: Secondary | ICD-10-CM

## 2023-10-18 DIAGNOSIS — M7918 Myalgia, other site: Secondary | ICD-10-CM

## 2023-10-18 DIAGNOSIS — M0600 Rheumatoid arthritis without rheumatoid factor, unspecified site: Secondary | ICD-10-CM | POA: Diagnosis not present

## 2023-10-18 DIAGNOSIS — G609 Hereditary and idiopathic neuropathy, unspecified: Secondary | ICD-10-CM

## 2023-10-18 DIAGNOSIS — E039 Hypothyroidism, unspecified: Secondary | ICD-10-CM

## 2023-10-18 DIAGNOSIS — M79671 Pain in right foot: Secondary | ICD-10-CM

## 2023-10-18 DIAGNOSIS — K900 Celiac disease: Secondary | ICD-10-CM

## 2023-10-18 DIAGNOSIS — M533 Sacrococcygeal disorders, not elsewhere classified: Secondary | ICD-10-CM

## 2023-10-18 DIAGNOSIS — Z79899 Other long term (current) drug therapy: Secondary | ICD-10-CM | POA: Diagnosis not present

## 2023-10-18 DIAGNOSIS — M47816 Spondylosis without myelopathy or radiculopathy, lumbar region: Secondary | ICD-10-CM

## 2023-10-18 DIAGNOSIS — E611 Iron deficiency: Secondary | ICD-10-CM

## 2023-10-18 DIAGNOSIS — M79641 Pain in right hand: Secondary | ICD-10-CM

## 2023-10-18 DIAGNOSIS — R5383 Other fatigue: Secondary | ICD-10-CM

## 2023-10-18 DIAGNOSIS — M5030 Other cervical disc degeneration, unspecified cervical region: Secondary | ICD-10-CM

## 2023-10-18 DIAGNOSIS — M5134 Other intervertebral disc degeneration, thoracic region: Secondary | ICD-10-CM

## 2023-10-18 DIAGNOSIS — D72821 Monocytosis (symptomatic): Secondary | ICD-10-CM

## 2023-10-18 DIAGNOSIS — M65332 Trigger finger, left middle finger: Secondary | ICD-10-CM

## 2023-10-18 DIAGNOSIS — Z8719 Personal history of other diseases of the digestive system: Secondary | ICD-10-CM

## 2023-10-18 DIAGNOSIS — M19041 Primary osteoarthritis, right hand: Secondary | ICD-10-CM

## 2023-10-18 DIAGNOSIS — M4004 Postural kyphosis, thoracic region: Secondary | ICD-10-CM

## 2023-10-18 DIAGNOSIS — M19071 Primary osteoarthritis, right ankle and foot: Secondary | ICD-10-CM

## 2023-10-18 DIAGNOSIS — M19072 Primary osteoarthritis, left ankle and foot: Secondary | ICD-10-CM

## 2023-10-18 DIAGNOSIS — Z8261 Family history of arthritis: Secondary | ICD-10-CM

## 2023-10-18 MED ORDER — PREDNISONE 5 MG PO TABS
ORAL_TABLET | ORAL | 0 refills | Status: AC
Start: 2023-10-18 — End: 2023-10-27
  Filled 2023-10-18: qty 20, 8d supply, fill #0

## 2023-10-18 NOTE — Telephone Encounter (Signed)
 Pending baseline labs from today, patient will be hydroxychloroquine new start  Dose: 200mg  once daily  Repeat CBC/CMP in 1 month, 3 months, then every 5 months  Wear sunscreen and/or UV protective clothing. Take with food if prone to nausea from meds  Leiby Pigeon, PharmD, MPH, BCPS, CPP Clinical Pharmacist [facility])

## 2023-10-18 NOTE — Progress Notes (Signed)
 Pharmacy Note  Subjective: Patient presents today to [facility] for follow up office visit.   Patient seen by the pharmacist for counseling on hydroxychloroquine rheumatoid arthritis.    Objective: CMP     Component Value Date/Time   NA 138 02/03/2018 1356   NA 141 07/27/2017 0000   K 4.2 02/03/2018 1356   CL 103 02/03/2018 1356   CO2 29 02/03/2018 1356   GLUCOSE 77 02/03/2018 1356   BUN 18 02/03/2018 1356   BUN 22 (A) 07/27/2017 0000   CREATININE 0.75 02/03/2018 1356   CALCIUM  9.7 02/03/2018 1356   PROT 6.5 07/14/2022 0811   ALBUMIN 3.9 07/14/2022 0811   AST 23 07/14/2022 0811   ALT 23 07/14/2022 0811   ALKPHOS 59 07/14/2022 0811   BILITOT 0.5 07/14/2022 0811    CBC    Component Value Date/Time   WBC 5.3 11/25/2021 1026   WBC 7.6 02/07/2019 1426   RBC 4.10 11/25/2021 1026   RBC 3.98 02/07/2019 1426   HGB 13.1 11/25/2021 1026   HGB 13.0 10/24/2012 1225   HCT 39.3 11/25/2021 1026   HCT 39.2 10/24/2012 1225   PLT 261 11/25/2021 1026   MCV 96 11/25/2021 1026   MCV 96.0 10/24/2012 1225   MCH 32.0 11/25/2021 1026   MCH 32.1 03/15/2016 0215   MCHC 33.3 11/25/2021 1026   MCHC 32.9 02/07/2019 1426   RDW 12.2 11/25/2021 1026   RDW 13.2 10/24/2012 1225   LYMPHSABS 2.0 11/25/2021 1026   LYMPHSABS 1.8 10/24/2012 1225   MONOABS 0.6 02/07/2019 1426   MONOABS 0.8 10/24/2012 1225   EOSABS 0.1 11/25/2021 1026   BASOSABS 0.1 11/25/2021 1026   BASOSABS 0.1 10/24/2012 1225    Assessment/Plan: Patient was counseled on the purpose, proper use, and adverse effects of hydroxychloroquine including nausea/diarrhea, skin rash, headaches, and sun sensitivity.  Advised patient to wear sunscreen once starting hydroxychloroquine to reduce risk of rash associated with sun sensitivity.  Discussed importance of annual eye exams while on hydroxychloroquine to monitor to ocular toxicity and discussed importance of frequent laboratory monitoring.  Provided patient with eye exam form  for baseline ophthalmologic exam.  Provided patient with educational materials on hydroxychloroquine and answered all questions.  Patient consented to hydroxychloroquine. Will upload consent in the media tab.    Reviewed risk for QTC prolongation when used in combination with other QTc prolonging agents (including but not limited to antiarrhythmics, macrolide antibiotics, flouroquinolone antibiotics, haloperidol, quetiapine, olanzapine, risperidone, droperidol, ziprasidone, amitriptyline, citalopram, ondansetron , migraine triptans, and methadone). Reviewed medication list and no concerning risk for QTC prolongation.   Dose will be Plaquenil 200 mg once daily.  Prescription pending lab results.   Maksymilian Mabey C. Aliyha Fornes [facility] PharmD Candidate Class of [phone]

## 2023-10-18 NOTE — Patient Instructions (Signed)
 Hydroxychloroquine  Tablets What is this medication? HYDROXYCHLOROQUINE  (hye drox ee KLOR oh kwin) treats autoimmune conditions, such as rheumatoid arthritis and lupus. It works by slowing down an overactive immune system. It may also be used to prevent and treat malaria. It works by killing the parasite that causes malaria. It belongs to a group of medications called DMARDs. This medicine may be used for other purposes; ask your health care provider or pharmacist if you have questions. COMMON BRAND NAME(S): Plaquenil , Quineprox, SOVUNA  What should I tell my care team before I take this medication? They need to know if you have any of these conditions: Diabetes Eye disease, vision problems Frequently drink alcohol G6PD deficiency Heart disease Irregular heartbeat or rhythm Kidney disease Liver disease Porphyria Psoriasis An unusual or allergic reaction to hydroxychloroquine , other medications, foods, dyes, or preservatives Pregnant or trying to get pregnant Breastfeeding How should I use this medication? Take this medication by mouth with water. Take it as directed on the prescription label. Do not cut, crush, or chew this medication. Swallow the tablets whole. Take it with food. Do not take it more than directed. Take all of this medication unless your care team tells you to stop it early. Keep taking it even if you think you are better. Take products with antacids in them at a different time of day than this medication. Take this medication 4 hours before or 4 hours after antacids. Talk to your care team if you have questions. Talk to your care team about the use of this medication in children. While this medication may be prescribed for selected conditions, precautions do apply. Overdosage: If you think you have taken too much of this medicine contact a poison control center or emergency room at once. NOTE: This medicine is only for you. Do not share this medicine with others. What if I  miss a dose? If you miss a dose, take it as soon as you can. If it is almost time for your next dose, take only that dose. Do not take double or extra doses. What may interact with this medication? Do not take this medication with any of the following: Cisapride Dronedarone Pimozide Thioridazine This medication may also interact with the following: Ampicillin Antacids Cimetidine Cyclosporine Digoxin Kaolin Medications for diabetes, such as insulin, glipizide, glyburide Medications for seizures, such as carbamazepine, phenobarbital, phenytoin Mefloquine Methotrexate Other medications that cause heart rhythm changes Praziquantel This list may not describe all possible interactions. Give your health care provider a list of all the medicines, herbs, non-prescription drugs, or dietary supplements you use. Also tell them if you smoke, drink alcohol, or use illegal drugs. Some items may interact with your medicine. What should I watch for while using this medication? Visit your care team for regular checks on your progress. Tell your care team if your symptoms do not start to get better or if they get worse. You may need blood work done while you are taking this medication. If you take other medications that can affect heart rhythm, you may need more testing. Talk to your care team if you have questions. Your vision may be tested before and during use of this medication. Tell your care team right away if you have any change in your eyesight. This medication may cause serious skin reactions. They can happen weeks to months after starting the medication. Contact your care team right away if you notice fevers or flu-like symptoms with a rash. The rash may be red or purple and then  turn into blisters or peeling of the skin. Or, you might notice a red rash with swelling of the face, lips or lymph nodes in your neck or under your arms. If you or your family notice any changes in your behavior, such as  new or worsening depression, thoughts of harming yourself, anxiety, or other unusual or disturbing thoughts, or memory loss, call your care team right away. What side effects may I notice from receiving this medication? Side effects that you should report to your care team as soon as possible: Allergic reactions--skin rash, itching, hives, swelling of the face, lips, tongue, or throat Aplastic anemia--unusual weakness or fatigue, dizziness, headache, trouble breathing, increased bleeding or bruising Change in vision Heart rhythm changes--fast or irregular heartbeat, dizziness, feeling faint or lightheaded, chest pain, trouble breathing Infection--fever, chills, cough, or sore throat Low blood sugar (hypoglycemia)--tremors or shaking, anxiety, sweating, cold or clammy skin, confusion, dizziness, rapid heartbeat Muscle injury--unusual weakness or fatigue, muscle pain, dark yellow or brown urine, decrease in amount of urine Pain, tingling, or numbness in the hands or feet Rash, fever, and swollen lymph nodes Redness, blistering, peeling, or loosening of the skin, including inside the mouth Thoughts of suicide or self-harm, worsening mood, or feelings of depression Unusual bruising or bleeding Side effects that usually do not require medical attention (report to your care team if they continue or are bothersome): Diarrhea Headache Nausea Stomach pain Vomiting This list may not describe all possible side effects. Call your doctor for medical advice about side effects. You may report side effects to FDA at 1-800-FDA-1088. Where should I keep my medication? Keep out of the reach of children and pets. Store at room temperature up to 30 degrees C (86 degrees F). Protect from light. Get rid of any unused medication after the expiration date. To get rid of medications that are no longer needed or have expired: Take the medication to a medication take-back program. Check with your pharmacy or law  enforcement to find a location. If you cannot return the medication, check the label or package insert to see if the medication should be thrown out in the garbage or flushed down the toilet. If you are not sure, ask your care team. If it is safe to put it in the trash, empty the medication out of the container. Mix the medication with cat litter, dirt, coffee grounds, or other unwanted substance. Seal the mixture in a bag or container. Put it in the trash. NOTE: This sheet is a summary. It may not cover all possible information. If you have questions about this medicine, talk to your doctor, pharmacist, or health care provider.  2024 Elsevier/Gold Standard (2021-07-28 00:00:00)  Standing Labs We placed an order today for your standing lab work.   Please have your standing labs drawn in 1 month, 3 months and then every 5 months  Please have your labs drawn 2 weeks prior to your appointment so that the provider can discuss your lab results at your appointment, if possible.  Please note that you may see your imaging and lab results in MyChart before we have reviewed them. We will contact you once all results are reviewed. Please allow our office up to 72 hours to thoroughly review all of the results before contacting the office for clarification of your results.  WALK-IN LAB HOURS  Monday through Thursday from 8:00 am -12:30 pm and 1:00 pm-4:30 pm and Friday from 8:00 am-12:00 pm.  Patients with office visits requiring labs will  be seen before walk-in labs.  You may encounter longer than normal wait times. Please allow additional time. Wait times may be shorter on  Monday and Thursday afternoons.  We do not book appointments for walk-in labs. We appreciate your patience and understanding with our staff.   Labs are drawn by [facility]. Please bring your co-pay at the time of your lab draw.  You may receive a bill from [facility] for your lab work.  Please note if you are on Hydroxychloroquine  and and an  order has been placed for a Hydroxychloroquine  level,  you will need to have it drawn 4 hours or more after your last dose.  If you wish to have your labs drawn at another location, please call the office 24 hours in advance so we can fax the orders.  The office is located at [address], [place], [place] [address]   If you have any questions regarding directions or hours of operation,  please call [phone].   As a reminder, please drink plenty of water prior to coming for your lab work. Thanks!   Vaccines You are taking a medication(s) that can suppress your immune system.  The following immunizations are recommended: Flu annually Covid-19  Td/Tdap (tetanus, diphtheria, pertussis) every 10 years Pneumonia (Prevnar 15 then Pneumovax 23 at least 1 year apart.  Alternatively, can take Prevnar 20 without needing additional dose) Shingrix: 2 doses from 4 weeks to 6 months apart  Please check with your PCP to make sure you are up to date.

## 2023-10-19 LAB — COLOGUARD: COLOGUARD: NEGATIVE

## 2023-10-20 ENCOUNTER — Ambulatory Visit: Payer: Self-pay | Admitting: Rheumatology

## 2023-10-20 NOTE — Progress Notes (Signed)
 CBC and CMP are normal, sed rate normal, rheumatoid factor and anti-CCP negative, uric acid normal, magnesium normal, PTH normal, CRP normal, G6PD pending.  Please send a prescription for Plaquenil 200 mg p.o. daily.  Total 30-day supply with 2 refills.  Patient should have labs in 1 month, 3 months and then every 5 months.

## 2023-10-21 LAB — COMPREHENSIVE METABOLIC PANEL WITH GFR
AG Ratio: 1.4 (calc) (ref 1.0–2.5)
ALT: 19 U/L (ref 6–29)
AST: 22 U/L (ref 10–35)
Albumin: 4.2 g/dL (ref 3.6–5.1)
Alkaline phosphatase (APISO): 61 U/L (ref 37–153)
BUN: 20 mg/dL (ref 7–25)
CO2: 30 mmol/L (ref 20–32)
Calcium: 9.9 mg/dL (ref 8.6–10.4)
Chloride: 105 mmol/L (ref 98–110)
Creat: 0.79 mg/dL (ref 0.50–1.05)
Globulin: 2.9 g/dL (ref 1.9–3.7)
Glucose, Bld: 79 mg/dL (ref 65–99)
Potassium: 4.5 mmol/L (ref 3.5–5.3)
Sodium: 139 mmol/L (ref 135–146)
Total Bilirubin: 0.4 mg/dL (ref 0.2–1.2)
Total Protein: 7.1 g/dL (ref 6.1–8.1)
eGFR: 82 mL/min/1.73m2 (ref >=60)

## 2023-10-21 LAB — CBC WITH DIFFERENTIAL/PLATELET
Absolute Lymphocytes: 2474 {cells}/uL (ref 850–3900)
Absolute Monocytes: 805 {cells}/uL (ref 200–950)
Basophils Absolute: 58 {cells}/uL (ref 0–200)
Basophils Relative: 0.6 %
Eosinophils Absolute: 213 {cells}/uL (ref 15–500)
Eosinophils Relative: 2.2 %
HCT: 40.7 % (ref 35.0–45.0)
Hemoglobin: 13 g/dL (ref 11.7–15.5)
MCH: 30.3 pg (ref 27.0–33.0)
MCHC: 31.9 g/dL — ABNORMAL LOW (ref 32.0–36.0)
MCV: 94.9 fL (ref 80.0–100.0)
MPV: 11.1 fL (ref 7.5–12.5)
Monocytes Relative: 8.3 %
Neutro Abs: 6150 {cells}/uL (ref 1500–7800)
Neutrophils Relative %: 63.4 %
Platelets: 297 Thousand/uL (ref 140–400)
RBC: 4.29 Million/uL (ref 3.80–5.10)
RDW: 13.7 % (ref 11.0–15.0)
Total Lymphocyte: 25.5 %
WBC: 9.7 Thousand/uL (ref 3.8–10.8)

## 2023-10-21 LAB — GLUCOSE 6 PHOSPHATE DEHYDROGENASE: G-6PDH: 17.1 U/g{Hb} (ref 7.0–20.5)

## 2023-10-21 LAB — URIC ACID: Uric Acid, Serum: 3.2 mg/dL (ref 2.5–7.0)

## 2023-10-21 LAB — RHEUMATOID FACTOR: Rheumatoid fact SerPl-aCnc: <10 [IU]/mL (ref <14)

## 2023-10-21 LAB — MAGNESIUM: Magnesium: 2.5 mg/dL (ref 1.5–2.5)

## 2023-10-21 LAB — PARATHYROID HORMONE, INTACT (NO CA): PTH: 43 pg/mL (ref 16–77)

## 2023-10-21 LAB — C-REACTIVE PROTEIN: CRP: <3 mg/L (ref <8.0)

## 2023-10-21 LAB — SEDIMENTATION RATE: Sed Rate: 6 mm/h (ref 0–30)

## 2023-10-21 LAB — CYCLIC CITRUL PEPTIDE ANTIBODY, IGG: Cyclic Citrullin Peptide Ab: <16 U

## 2023-10-21 NOTE — Progress Notes (Signed)
 CBC normal, CMP normal, sed rate normal, rheumatoid factor negative, anti-CCP negative, uric acid normal, PTH normal, CRP normal, G6PD normal.  Okay to send the prescription for hydroxychloroquine to 200 mg p.o. daily 30-day supply with 2 refills.

## 2023-10-21 NOTE — Telephone Encounter (Signed)
 Patient states she would like to hold off on starting the Plaquenil. Patient states she will call if she decides she would like to start it before her next appointment.

## 2023-10-25 ENCOUNTER — Ambulatory Visit: Payer: Self-pay | Admitting: Internal Medicine

## 2023-11-25 ENCOUNTER — Other Ambulatory Visit (HOSPITAL_BASED_OUTPATIENT_CLINIC_OR_DEPARTMENT_OTHER): Payer: Self-pay

## 2023-11-25 MED ORDER — FLUZONE 0.5 ML IM SUSY
0.5000 mL | PREFILLED_SYRINGE | Freq: Once | INTRAMUSCULAR | 0 refills | Status: AC
  Filled 2023-11-25: qty 0.5, 1d supply, fill #0

## 2023-12-01 ENCOUNTER — Other Ambulatory Visit: Payer: Self-pay | Admitting: Medical Genetics

## 2023-12-01 ENCOUNTER — Other Ambulatory Visit (HOSPITAL_BASED_OUTPATIENT_CLINIC_OR_DEPARTMENT_OTHER): Payer: Self-pay

## 2023-12-01 DIAGNOSIS — Z006 Encounter for examination for normal comparison and control in clinical research program: Secondary | ICD-10-CM

## 2023-12-02 ENCOUNTER — Other Ambulatory Visit (HOSPITAL_BASED_OUTPATIENT_CLINIC_OR_DEPARTMENT_OTHER): Payer: Self-pay

## 2023-12-06 ENCOUNTER — Other Ambulatory Visit: Payer: Self-pay | Admitting: Family Medicine

## 2023-12-06 ENCOUNTER — Encounter: Payer: Self-pay | Admitting: Radiology

## 2023-12-06 ENCOUNTER — Other Ambulatory Visit (HOSPITAL_BASED_OUTPATIENT_CLINIC_OR_DEPARTMENT_OTHER): Payer: Self-pay

## 2023-12-06 DIAGNOSIS — Z1231 Encounter for screening mammogram for malignant neoplasm of breast: Secondary | ICD-10-CM

## 2023-12-07 ENCOUNTER — Other Ambulatory Visit (HOSPITAL_BASED_OUTPATIENT_CLINIC_OR_DEPARTMENT_OTHER): Payer: Self-pay

## 2023-12-07 MED ORDER — ESTRADIOL 0.1 MG/24HR TD PTTW
MEDICATED_PATCH | TRANSDERMAL | 1 refills | Status: AC
  Filled 2023-12-07: qty 24, 84d supply, fill #0

## 2023-12-16 ENCOUNTER — Other Ambulatory Visit: Payer: Self-pay

## 2023-12-21 LAB — GENECONNECT MOLECULAR SCREEN: Genetic Analysis Overall Interpretation: NEGATIVE

## 2023-12-23 DIAGNOSIS — K080 Exfoliation of teeth due to systemic causes: Secondary | ICD-10-CM | POA: Diagnosis not present

## 2023-12-24 ENCOUNTER — Encounter: Payer: Self-pay | Admitting: Oncology

## 2023-12-24 ENCOUNTER — Other Ambulatory Visit (HOSPITAL_BASED_OUTPATIENT_CLINIC_OR_DEPARTMENT_OTHER): Payer: Self-pay

## 2023-12-24 MED ORDER — SUMATRIPTAN SUCCINATE 50 MG PO TABS
50.0000 mg | ORAL_TABLET | ORAL | 1 refills | Status: AC | PRN
Start: 2023-12-24 — End: ?
  Filled 2023-12-24: qty 27, 13d supply, fill #0

## 2023-12-27 ENCOUNTER — Other Ambulatory Visit: Payer: Self-pay

## 2023-12-27 ENCOUNTER — Other Ambulatory Visit (HOSPITAL_BASED_OUTPATIENT_CLINIC_OR_DEPARTMENT_OTHER): Payer: Self-pay

## 2023-12-27 MED ORDER — LEVOTHYROXINE SODIUM 112 MCG PO TABS
112.0000 ug | ORAL_TABLET | Freq: Every morning | ORAL | 0 refills | Status: AC
  Filled 2023-12-27: qty 48, 48d supply, fill #0

## 2023-12-27 MED ORDER — RESTASIS 0.05 % OP EMUL
1.0000 [drp] | Freq: Two times a day (BID) | OPHTHALMIC | 3 refills | Status: AC
  Filled 2023-12-27: qty 180, 90d supply, fill #0

## 2023-12-29 NOTE — Progress Notes (Deleted)
 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   History of Present Illness: Marilyn Collins is a 68 y.o. female ***     Activities of Daily Living:  Patient reports morning stiffness for *** {minute/hour:19697}.   Patient {ACTIONS;DENIES/REPORTS:21021675::Denies} nocturnal pain.  Difficulty dressing/grooming: {ACTIONS;DENIES/REPORTS:21021675::Denies} Difficulty climbing stairs: {ACTIONS;DENIES/REPORTS:21021675::Denies} Difficulty getting out of chair: {ACTIONS;DENIES/REPORTS:21021675::Denies} Difficulty using hands for taps, buttons, cutlery, and/or writing: {ACTIONS;DENIES/REPORTS:21021675::Denies}  No Rheumatology ROS completed.   PMFS History:  Patient Active Problem List   Diagnosis Date Noted   Chronic migraine w/o aura w/o status migrainosus, not intractable 04/17/2022   Muscle spasm 04/17/2022   Palpitations 11/24/2021   Aortic atherosclerosis 11/24/2021   DDD (degenerative disc disease), lumbar 11/19/2020   Thoracic disc disease 04/19/2019   Myofascial pain syndrome 04/19/2019   Idiopathic peripheral neuropathy 02/07/2019   Acquired hypothyroidism 02/03/2018   Hormone replacement therapy (HRT) 02/03/2018   Underweight 02/03/2018   GERD (gastroesophageal reflux disease)    Postural kyphosis of thoracic region 03/16/2017   Neck pain 11/20/2016   Chronic right-sided thoracic back pain 08/03/2016   Celiac disease 07/20/2011   Dietary iron deficiency - malabsorption 07/20/2011   Chronic idiopathic monocytosis 07/20/2011    Past Medical History:  Diagnosis Date   Acquired hypothyroidism 02/03/2018   Anxiety    Aortic atherosclerosis 11/24/2021   Arthritis    Celiac disease 07/20/2011   Chronic idiopathic monocytosis 07/20/2011   Dysphagia  12/2012   responsive to dexilant    GERD (gastroesophageal reflux disease)    H/O eating disorder    Hypothyroidism    Iron deficiency anemia 07/20/2011   Iron deficiency anemia, unspecified 07/20/2011   Migraine headache    Other specified intestinal malabsorption 07/20/2011   Palpitations 11/24/2021    Family History  Problem Relation Age of Onset   Cancer Mother    Heart disease Mother    Hyperlipidemia Father    Hypertension Father    Stroke Father    CAD Father    Hypertension Brother    Cancer Maternal Grandmother    Stroke Maternal Grandfather    Heart disease Paternal Grandmother    CAD Paternal Grandfather    Anxiety disorder Son    Bipolar disorder Son    Liver disease Neg Hx    Esophageal cancer Neg Hx    Past Surgical History:  Procedure Laterality Date   ABDOMINAL HYSTERECTOMY     COLONOSCOPY  06/2003   neg   DIAGNOSTIC LAPAROSCOPY     ESOPHAGOGASTRODUODENOSCOPY N/A 10/20/2012   Procedure: ESOPHAGOGASTRODUODENOSCOPY (EGD);  Surgeon: Lamar LULLA Bunk, MD;  Location: [facility];  Service: Endoscopy;  Laterality: N/A;   FLEXIBLE SIGMOIDOSCOPY  2005   SINUS IRRIGATION     TMJ ARTHROPLASTY     Social History   Tobacco Use   Smoking status: Never    Passive exposure: Never   Smokeless tobacco: Never  Vaping Use   Vaping status: Never Used  Substance Use Topics   Alcohol use: No    Alcohol/week: 0.0 standard drinks of alcohol   Drug use: No   Social History   Social History Narrative  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   Physical Exam   Musculoskeletal Exam: ***  CDAI Exam: CDAI Score: -- Patient Global: --; Provider Global: -- Swollen: --; Tender: -- Joint Exam 01/12/2024   No joint exam has been documented for this visit   There is currently no information documented on the homunculus. Go to the Rheumatology activity and complete the homunculus joint exam.  Investigation: No additional findings.  Imaging: No results found.  Recent Labs: Lab Results  Component Value Date   WBC 9.7 10/18/2023   HGB 13.0 10/18/2023   PLT 297 10/18/2023   NA 139 10/18/2023   K 4.5 10/18/2023   CL 105 10/18/2023   CO2 30 10/18/2023   GLUCOSE 79 10/18/2023   BUN 20 10/18/2023   CREATININE 0.79 10/18/2023   BILITOT 0.4 10/18/2023   ALKPHOS 59 07/14/2022   AST 22 10/18/2023   ALT 19 10/18/2023   PROT 7.1 10/18/2023   ALBUMIN 3.9 07/14/2022   CALCIUM  9.9 10/18/2023    Speciality Comments: No specialty comments available.  Procedures:  No procedures performed Allergies: Morphine and codeine and Venofer [ferric oxide]   Assessment / Plan:     Visit Diagnoses: Seronegative rheumatoid arthritis (HCC)  High risk medication use  Sicca complex  Pain in both hands  Primary osteoarthritis of both hands  Pain in both feet  Chronic right SI joint pain  Primary osteoarthritis of both feet  DDD (degenerative disc disease), cervical  Postural kyphosis of thoracic region  DDD (degenerative disc disease), thoracic  Other fatigue  Spondylosis of lumbar spine  Myofascial pain syndrome  Osteopenia of multiple sites  Celiac disease  Dietary iron deficiency - malabsorption  History of gastroesophageal reflux (GERD)  Chronic idiopathic monocytosis  Idiopathic peripheral neuropathy  Acquired hypothyroidism  Family history of rheumatoid arthritis-paternal aunt  Orders: No orders of the  defined types were placed in this encounter.  No orders of the defined types were placed in this encounter.   Face-to-face time spent with patient was *** minutes. Greater than 50% of time was spent in counseling and coordination of care.  Follow-Up Instructions: No follow-ups on file.   Waddell CHRISTELLA Craze, PA-C  Note - This record has been created using Dragon software.  Chart creation errors have been sought, but may not always  have been located. Such creation errors do not reflect on  the standard of medical care.

## 2023-12-31 ENCOUNTER — Other Ambulatory Visit (HOSPITAL_BASED_OUTPATIENT_CLINIC_OR_DEPARTMENT_OTHER): Payer: Self-pay

## 2024-01-05 ENCOUNTER — Encounter: Payer: Self-pay | Admitting: Oncology

## 2024-01-05 ENCOUNTER — Ambulatory Visit: Admission: RE | Admit: 2024-01-05 | Discharge: 2024-01-05 | Disposition: A | Source: Ambulatory Visit

## 2024-01-05 DIAGNOSIS — Z1231 Encounter for screening mammogram for malignant neoplasm of breast: Secondary | ICD-10-CM

## 2024-01-12 ENCOUNTER — Ambulatory Visit: Admitting: Physician Assistant

## 2024-01-12 DIAGNOSIS — Z8261 Family history of arthritis: Secondary | ICD-10-CM

## 2024-01-12 DIAGNOSIS — M3500 Sicca syndrome, unspecified: Secondary | ICD-10-CM

## 2024-01-12 DIAGNOSIS — M47816 Spondylosis without myelopathy or radiculopathy, lumbar region: Secondary | ICD-10-CM

## 2024-01-12 DIAGNOSIS — E039 Hypothyroidism, unspecified: Secondary | ICD-10-CM

## 2024-01-12 DIAGNOSIS — M4004 Postural kyphosis, thoracic region: Secondary | ICD-10-CM

## 2024-01-12 DIAGNOSIS — Z79899 Other long term (current) drug therapy: Secondary | ICD-10-CM

## 2024-01-12 DIAGNOSIS — M8589 Other specified disorders of bone density and structure, multiple sites: Secondary | ICD-10-CM

## 2024-01-12 DIAGNOSIS — M79671 Pain in right foot: Secondary | ICD-10-CM

## 2024-01-12 DIAGNOSIS — Z8719 Personal history of other diseases of the digestive system: Secondary | ICD-10-CM

## 2024-01-12 DIAGNOSIS — G8929 Other chronic pain: Secondary | ICD-10-CM

## 2024-01-12 DIAGNOSIS — R5383 Other fatigue: Secondary | ICD-10-CM

## 2024-01-12 DIAGNOSIS — E611 Iron deficiency: Secondary | ICD-10-CM

## 2024-01-12 DIAGNOSIS — M0600 Rheumatoid arthritis without rheumatoid factor, unspecified site: Secondary | ICD-10-CM

## 2024-01-12 DIAGNOSIS — M19042 Primary osteoarthritis, left hand: Secondary | ICD-10-CM

## 2024-01-12 DIAGNOSIS — M79642 Pain in left hand: Secondary | ICD-10-CM

## 2024-01-12 DIAGNOSIS — K900 Celiac disease: Secondary | ICD-10-CM

## 2024-01-12 DIAGNOSIS — M19072 Primary osteoarthritis, left ankle and foot: Secondary | ICD-10-CM

## 2024-01-12 DIAGNOSIS — D72821 Monocytosis (symptomatic): Secondary | ICD-10-CM

## 2024-01-12 DIAGNOSIS — M5134 Other intervertebral disc degeneration, thoracic region: Secondary | ICD-10-CM

## 2024-01-12 DIAGNOSIS — M5030 Other cervical disc degeneration, unspecified cervical region: Secondary | ICD-10-CM

## 2024-01-12 DIAGNOSIS — G609 Hereditary and idiopathic neuropathy, unspecified: Secondary | ICD-10-CM

## 2024-01-12 DIAGNOSIS — M7918 Myalgia, other site: Secondary | ICD-10-CM

## 2024-01-20 DIAGNOSIS — K080 Exfoliation of teeth due to systemic causes: Secondary | ICD-10-CM | POA: Diagnosis not present

## 2024-01-25 ENCOUNTER — Encounter (HOSPITAL_BASED_OUTPATIENT_CLINIC_OR_DEPARTMENT_OTHER): Admitting: Radiology

## 2024-02-15 ENCOUNTER — Encounter: Payer: Self-pay | Admitting: Rheumatology

## 2024-02-15 ENCOUNTER — Other Ambulatory Visit (HOSPITAL_BASED_OUTPATIENT_CLINIC_OR_DEPARTMENT_OTHER): Payer: Self-pay

## 2024-02-15 MED ORDER — PREDNISONE 5 MG PO TABS
ORAL_TABLET | ORAL | 0 refills | Status: AC
  Filled 2024-02-15: qty 20, 8d supply, fill #0

## 2024-02-15 NOTE — Telephone Encounter (Signed)
 Rescheduled patient's appointment for May (per her request). Advised patient that side effects of prednisone  include weight gain, heart disease, hypertension, diabetes, osteoporosis, cataracts. Patient verbalized understanding.

## 2024-02-15 NOTE — Telephone Encounter (Signed)
 Patient canceled her appointment in December.  Please schedule a follow-up appointment.  Okay to send a prednisone  taper starting at 20 mg and taper by 5 mg every 2 days.  Please notify patient that side effects of prednisone  include weight gain, heart disease, hypertension, diabetes, osteoporosis, cataracts.

## 2024-02-17 ENCOUNTER — Other Ambulatory Visit (HOSPITAL_BASED_OUTPATIENT_CLINIC_OR_DEPARTMENT_OTHER): Payer: Self-pay

## 2024-06-28 ENCOUNTER — Ambulatory Visit: Admitting: Rheumatology
